# Patient Record
Sex: Male | Born: 1955 | Race: White | Hispanic: No | Marital: Married | Smoking: Former smoker
Health system: Southern US, Community
[De-identification: ages and names within clinical notes are randomized; demographics above are authoritative.]

## PROBLEM LIST (undated history)

## (undated) DIAGNOSIS — Z98811 Dental restoration status: Secondary | ICD-10-CM

## (undated) DIAGNOSIS — G4730 Sleep apnea, unspecified: Secondary | ICD-10-CM

## (undated) DIAGNOSIS — K219 Gastro-esophageal reflux disease without esophagitis: Secondary | ICD-10-CM

## (undated) DIAGNOSIS — T7840XA Allergy, unspecified, initial encounter: Secondary | ICD-10-CM

## (undated) DIAGNOSIS — F329 Major depressive disorder, single episode, unspecified: Secondary | ICD-10-CM

## (undated) DIAGNOSIS — J449 Chronic obstructive pulmonary disease, unspecified: Secondary | ICD-10-CM

## (undated) DIAGNOSIS — R918 Other nonspecific abnormal finding of lung field: Secondary | ICD-10-CM

## (undated) DIAGNOSIS — E785 Hyperlipidemia, unspecified: Secondary | ICD-10-CM

## (undated) DIAGNOSIS — C61 Malignant neoplasm of prostate: Secondary | ICD-10-CM

## (undated) DIAGNOSIS — M1990 Unspecified osteoarthritis, unspecified site: Secondary | ICD-10-CM

## (undated) DIAGNOSIS — G4733 Obstructive sleep apnea (adult) (pediatric): Secondary | ICD-10-CM

## (undated) DIAGNOSIS — C801 Malignant (primary) neoplasm, unspecified: Secondary | ICD-10-CM

## (undated) DIAGNOSIS — Z923 Personal history of irradiation: Secondary | ICD-10-CM

## (undated) DIAGNOSIS — Z9989 Dependence on other enabling machines and devices: Secondary | ICD-10-CM

## (undated) DIAGNOSIS — Z8546 Personal history of malignant neoplasm of prostate: Secondary | ICD-10-CM

## (undated) DIAGNOSIS — M6530 Trigger finger, unspecified finger: Secondary | ICD-10-CM

## (undated) DIAGNOSIS — F419 Anxiety disorder, unspecified: Secondary | ICD-10-CM

## (undated) DIAGNOSIS — F32A Depression, unspecified: Secondary | ICD-10-CM

## (undated) HISTORY — DX: Malignant neoplasm of prostate: C61

## (undated) HISTORY — DX: Hyperlipidemia, unspecified: E78.5

## (undated) HISTORY — DX: Dependence on other enabling machines and devices: Z99.89

## (undated) HISTORY — DX: Malignant (primary) neoplasm, unspecified: C80.1

## (undated) HISTORY — PX: KNEE ARTHROSCOPY: SHX127

## (undated) HISTORY — PX: INGUINAL HERNIA REPAIR: SHX194

## (undated) HISTORY — DX: Obstructive sleep apnea (adult) (pediatric): G47.33

## (undated) HISTORY — DX: Allergy, unspecified, initial encounter: T78.40XA

## (undated) HISTORY — DX: Sleep apnea, unspecified: G47.30

---

## 1998-11-10 ENCOUNTER — Ambulatory Visit (HOSPITAL_COMMUNITY): Admission: RE | Admit: 1998-11-10 | Discharge: 1998-11-10 | Payer: Self-pay | Admitting: Internal Medicine

## 1999-08-29 ENCOUNTER — Ambulatory Visit (HOSPITAL_COMMUNITY): Admission: RE | Admit: 1999-08-29 | Discharge: 1999-08-29 | Payer: Self-pay | Admitting: Internal Medicine

## 2000-04-25 ENCOUNTER — Encounter: Payer: Self-pay | Admitting: Otolaryngology

## 2000-04-25 ENCOUNTER — Encounter: Admission: RE | Admit: 2000-04-25 | Discharge: 2000-04-25 | Payer: Self-pay | Admitting: Otolaryngology

## 2000-08-01 ENCOUNTER — Encounter (INDEPENDENT_AMBULATORY_CARE_PROVIDER_SITE_OTHER): Payer: Self-pay | Admitting: Specialist

## 2000-08-01 ENCOUNTER — Ambulatory Visit (HOSPITAL_BASED_OUTPATIENT_CLINIC_OR_DEPARTMENT_OTHER): Admission: RE | Admit: 2000-08-01 | Discharge: 2000-08-01 | Payer: Self-pay | Admitting: Otolaryngology

## 2000-08-01 HISTORY — PX: NASAL CONCHA BULLOSA RESECTION: SHX2062

## 2000-08-01 HISTORY — PX: NASAL TURBINATE REDUCTION: SHX2072

## 2000-08-01 HISTORY — PX: SEPTOPLASTY: SHX2393

## 2000-08-03 ENCOUNTER — Emergency Department (HOSPITAL_COMMUNITY): Admission: EM | Admit: 2000-08-03 | Discharge: 2000-08-03 | Payer: Self-pay | Admitting: Emergency Medicine

## 2004-03-29 ENCOUNTER — Encounter: Admission: RE | Admit: 2004-03-29 | Discharge: 2004-03-29 | Payer: Self-pay | Admitting: General Surgery

## 2004-03-30 ENCOUNTER — Ambulatory Visit (HOSPITAL_COMMUNITY): Admission: RE | Admit: 2004-03-30 | Discharge: 2004-03-30 | Payer: Self-pay | Admitting: General Surgery

## 2004-03-30 ENCOUNTER — Ambulatory Visit (HOSPITAL_BASED_OUTPATIENT_CLINIC_OR_DEPARTMENT_OTHER): Admission: RE | Admit: 2004-03-30 | Discharge: 2004-03-30 | Payer: Self-pay | Admitting: General Surgery

## 2004-03-30 HISTORY — PX: HYDROCELE EXCISION: SHX482

## 2004-03-30 HISTORY — PX: INGUINAL HERNIA REPAIR: SHX194

## 2004-10-09 ENCOUNTER — Ambulatory Visit: Payer: Self-pay | Admitting: Critical Care Medicine

## 2004-10-18 ENCOUNTER — Ambulatory Visit: Payer: Self-pay | Admitting: Internal Medicine

## 2004-10-30 ENCOUNTER — Ambulatory Visit: Payer: Self-pay | Admitting: Gastroenterology

## 2004-11-02 ENCOUNTER — Ambulatory Visit: Payer: Self-pay | Admitting: Gastroenterology

## 2004-11-07 ENCOUNTER — Ambulatory Visit: Payer: Self-pay | Admitting: Internal Medicine

## 2004-11-09 ENCOUNTER — Ambulatory Visit: Payer: Self-pay | Admitting: Internal Medicine

## 2004-12-08 ENCOUNTER — Ambulatory Visit: Payer: Self-pay | Admitting: Internal Medicine

## 2005-03-28 ENCOUNTER — Ambulatory Visit: Payer: Self-pay | Admitting: Internal Medicine

## 2006-04-09 ENCOUNTER — Ambulatory Visit: Payer: Self-pay | Admitting: Internal Medicine

## 2006-04-24 ENCOUNTER — Ambulatory Visit: Payer: Self-pay | Admitting: Gastroenterology

## 2006-04-24 ENCOUNTER — Ambulatory Visit: Payer: Self-pay | Admitting: Internal Medicine

## 2006-05-08 ENCOUNTER — Ambulatory Visit: Payer: Self-pay | Admitting: Gastroenterology

## 2006-05-08 HISTORY — PX: COLONOSCOPY: SHX174

## 2006-05-08 LAB — HM COLONOSCOPY: HM COLON: NORMAL

## 2006-07-25 ENCOUNTER — Ambulatory Visit: Payer: Self-pay | Admitting: Internal Medicine

## 2006-11-13 ENCOUNTER — Ambulatory Visit: Payer: Self-pay | Admitting: Internal Medicine

## 2006-12-18 ENCOUNTER — Ambulatory Visit: Payer: Self-pay | Admitting: Internal Medicine

## 2007-10-01 ENCOUNTER — Ambulatory Visit: Payer: Self-pay | Admitting: Internal Medicine

## 2007-10-01 LAB — CONVERTED CEMR LAB
ALT: 42 units/L (ref 0–53)
AST: 33 units/L (ref 0–37)
Albumin: 4.2 g/dL (ref 3.5–5.2)
Alkaline Phosphatase: 103 units/L (ref 39–117)
BUN: 6 mg/dL (ref 6–23)
Basophils Absolute: 0 10*3/uL (ref 0.0–0.1)
Basophils Relative: 0 % (ref 0.0–1.0)
Bilirubin Urine: NEGATIVE
Bilirubin, Direct: 0.2 mg/dL (ref 0.0–0.3)
CO2: 30 meq/L (ref 19–32)
Calcium: 9.2 mg/dL (ref 8.4–10.5)
Chloride: 101 meq/L (ref 96–112)
Cholesterol: 171 mg/dL (ref 0–200)
Creatinine, Ser: 0.8 mg/dL (ref 0.4–1.5)
Direct LDL: 80.7 mg/dL
Eosinophils Absolute: 0.1 10*3/uL (ref 0.0–0.6)
Eosinophils Relative: 1.6 % (ref 0.0–5.0)
GFR calc Af Amer: 130 mL/min
GFR calc non Af Amer: 107 mL/min
Glucose, Bld: 97 mg/dL (ref 70–99)
HCT: 41.4 % (ref 39.0–52.0)
HDL: 32.2 mg/dL — ABNORMAL LOW (ref >39.0)
Hemoglobin, Urine: NEGATIVE
Hemoglobin: 14.7 g/dL (ref 13.0–17.0)
Ketones, ur: NEGATIVE mg/dL
Leukocytes, UA: NEGATIVE
Lymphocytes Relative: 16 % (ref 12.0–46.0)
MCHC: 35.5 g/dL (ref 30.0–36.0)
MCV: 87 fL (ref 78.0–100.0)
Monocytes Absolute: 0.2 10*3/uL (ref 0.2–0.7)
Monocytes Relative: 3 % (ref 3.0–11.0)
Neutro Abs: 5.2 10*3/uL (ref 1.4–7.7)
Neutrophils Relative %: 79.4 % — ABNORMAL HIGH (ref 43.0–77.0)
Nitrite: NEGATIVE
PSA: 1.43 ng/mL (ref 0.10–4.00)
Platelets: 307 10*3/uL (ref 150–400)
Potassium: 4 meq/L (ref 3.5–5.1)
RBC: 4.75 M/uL (ref 4.22–5.81)
RDW: 12.6 % (ref 11.5–14.6)
Sodium: 139 meq/L (ref 135–145)
Specific Gravity, Urine: 1.01 (ref 1.000–1.03)
TSH: 2.29 microintl units/mL (ref 0.35–5.50)
Total Bilirubin: 1.2 mg/dL (ref 0.3–1.2)
Total CHOL/HDL Ratio: 5.3
Total Protein, Urine: NEGATIVE mg/dL
Total Protein: 6.8 g/dL (ref 6.0–8.3)
Triglycerides: 320 mg/dL (ref 0–149)
Urine Glucose: NEGATIVE mg/dL
Urobilinogen, UA: 0.2 (ref 0.0–1.0)
VLDL: 64 mg/dL — ABNORMAL HIGH (ref 0–40)
WBC: 6.6 10*3/uL (ref 4.5–10.5)
pH: 7 (ref 5.0–8.0)

## 2007-10-08 DIAGNOSIS — F418 Other specified anxiety disorders: Secondary | ICD-10-CM | POA: Insufficient documentation

## 2007-10-08 DIAGNOSIS — K219 Gastro-esophageal reflux disease without esophagitis: Secondary | ICD-10-CM | POA: Insufficient documentation

## 2007-10-08 DIAGNOSIS — J45909 Unspecified asthma, uncomplicated: Secondary | ICD-10-CM | POA: Insufficient documentation

## 2007-10-09 ENCOUNTER — Ambulatory Visit: Payer: Self-pay | Admitting: Internal Medicine

## 2007-10-09 DIAGNOSIS — D2370 Other benign neoplasm of skin of unspecified lower limb, including hip: Secondary | ICD-10-CM | POA: Insufficient documentation

## 2008-04-01 ENCOUNTER — Encounter: Payer: Self-pay | Admitting: Internal Medicine

## 2008-04-07 ENCOUNTER — Encounter: Payer: Self-pay | Admitting: Internal Medicine

## 2011-04-20 NOTE — Op Note (Signed)
NAME:  Samuel Richmond, Samuel Richmond                        ACCOUNT NO.:  [id number]   MEDICAL RECORD NO.:  [id number]                   PATIENT TYPE:  AMB   LOCATION:  DSC                                  FACILITY:  [facility]   PHYSICIAN:  Ollen Gross. Vernell Morgans, M.D.              DATE OF BIRTH:  [birth year]   DATE OF PROCEDURE:  03/30/2004  DATE OF DISCHARGE:  03/30/2004                                 OPERATIVE REPORT   PREOPERATIVE DIAGNOSIS:  Right inguinal hernia.   POSTOPERATIVE DIAGNOSIS:  Right direct inguinal hernia with large hydrocele.   PROCEDURE:  Right inguinal hernia repair with mesh and hydrocelectomy.   SURGEON:  Dr. Carolynne Edouard.   ANESTHESIA:  General via LMA.   PROCEDURE:  After informed consent was obtained, the patient was brought to  the operating room and placed in the supine position on the operating room  table.  After adequate levels of general anesthesia, the patient's abdomen  and right groin was prepped with Betadine and draped in the usual sterile  manner.  An incision was made from the edge of the pubic tubercle on the  right towards the anterior superior iliac spine for a distance of about 5  cm.  This incision was carried down through the skin and subcutaneous tissue  sharply with the electrocautery.  A small bridging vein and subcutaneous  tissue was encountered and this was clamped with hemostats and divided and  ligated with 3-0 silk ties.  The rest of the dissection was carried down  sharply with the electrocautery to the fascia of the external oblique.  The  fascia over the external oblique was opened along its fibers towards the  apex of the external ring.  This was done sharply with the Metzenbaum  scissors, taking care to avoid the ileoinguinal nerve beneath.  The nerve  appeared to be wrapped up in a significant amount of scar tissue and so the  nerve was ligated both proximally and distally by clamping with a hemostat  and dividing and ligating with 3-0 silk ties.   Blunt finger dissection was  carried out of the cord structures at the edge of the pubic tubercle.  At  this point, it was noted that there was a very large fluid-filled cyst  accompanying the cord structures down to the testicle.  There was also a  noticeable weakness of the floor of the inguinal canal with a small broad-  based hernia.  The hydrocele was separated somewhat from the rest of the  cord structures by a combination of blunt dissection with a hemostat and  sharp dissection with the electrocautery.  The sac was then opened and the  majority of the sac was able to be excised sharply with the Bovie  electrocautery, separating it from the rest of the cord structures.  The  hydrocele was tethered to the testicle and a small portion of the hydrocele  cavity lining  was left intact on the testicle.  At this point, the cord  structures were significantly smaller and able to be evaluated better.  There was no apparent indirect component of hernia to this area.  The floor  of the inguinal canal, the broad-based direct hernia at the floor of the  inguinal canal was reduced below the transversalis layer.  The floor of the  canal was repaired with some interrupted 2-0 Vicryl stitches and then the  floor of the canal was reinforced with a piece of 3 x 6 Prolene mesh that  was cut to fit.  Tails were cut in the mesh laterally to wrap around the  cord structures.  The mesh was sewn inferiorly to the shelving edge of the  inguinal ligament using a running 2-0 Prolene stitch.  Superiorly the mesh  was sewn to the muscular layer of the aponeurotic transverse layer using  interrupted 2-0 Prolene vertical mattress sutures.  The tails of the mesh  were wrapped around the cord structures and the tails were anchored  laterally to the shelving edge of the inguinal ligament with an interrupted  2-0 Prolene stitch.  Once this was completed, the mesh appeared to be in  good position without any tension.   The repair was intact.  The wound was  irrigated with copious amounts of saline.  The external oblique was then  reapproximated with a running 2-0 Vicryl stitch.  The wound was infiltrated  with 0.5% Marcaine.  The subcutaneous fascia was closed with a running 3-0  Vicryl stitch and the skin was closed with a  running 4-0 Monocryl subcuticular stitch.  Benzoin and Steri-Strips and  sterile dressings were applied.  The patient tolerated the procedure well.  At the end of the case, all needle, sponge and instrument counts were  correct.  The patient was then awakened and taken to the recovery room in  stable condition.                                               Ollen Gross. Vernell Morgans, M.D.    PST/MEDQ  D:  04/03/2004  T:  04/03/2004  Job:  [id number]

## 2011-04-20 NOTE — Op Note (Signed)
[facility]  Patient:    Samuel Richmond, Samuel Richmond                     MRN: [id number] Proc. Date: 08/01/00 Adm. Date:  [id number] Attending:  Serena Colonel H CC:         Pearla Dubonnet, M.D.   Operative Report  PREOPERATIVE DIAGNOSES: 1. Septal deviation. 2. Hypertrophic changes of the inferior turbinates. 3. Right concha bullosa.  POSTOPERATIVE DIAGNOSES: 1. Septal deviation. 2. Hypertrophic changes of the inferior turbinates. 3. Right concha bullosa.  PROCEDURES: 1. Nasal septoplasty. 2. Submucous resection inferior turbinates. 3. Resection of right concha bullosa of the middle turbinate.  SURGEON:  Jefry H. Pollyann Kennedy, M.D.  ANESTHESIA:  General endotracheal anesthesia.  COMPLICATIONS:  None.  ESTIMATED BLOOD LOSS:  20 cc.  FINDINGS:  Severe deviation of the septum with bilateral spurs and deviated areas, but mainly to the left side.  Severe enlargement and thickening of the inferior turbinates.  Large concha bullosa right middle turbinate with obstruction of the nasal cavity and the osteomeatal complex on the right side.  REFERRING PHYSICIAN:  Dr. Pearla Dubonnet, M.D.  DISPOSITION:  The patient tolerated the procedure well, was awakened, extubated and transferred to recovery in stable condition.  INDICATIONS FOR PROCEDURE:  This is a 55 year old with a long history of nasal obstruction and recurring sinus infections.  Risks, benefits, alternatives and complications of the procedure were explained to the patient, who seemed to understand and agreed to surgery.  PROCEDURE:  The patient was taken to the operating room, placed on the operating table in supine position.  Following induction of general endotracheal anesthesia, the patient was prepped and draped in a standard fashion.  Afrin spray was used preoperatively in the nose.  Xylocaine 1% with 1:100,000 epinephrine was infiltrated along the septum, columella, the inferior  turbinates and the superior and posterior attachments of the right middle turbinate.  A total of about 5 cc were used.   Nasal cavities were then packed with Afrin-soaked pledgets.  1. Septoplasty.  A left hemitransfixion incision was created with a 15 scalpel    and a mucoperichondrial flap was developed posteriorly down the left side    of the nose.  Great care was taken to avoid tearing the mucosa, but several    tears did occur while trying to elevate mucosa around very sharp spurs on    the left side.  The bony cartilaginous junction was divided and a flap was    developed down the right side as well.  The right flap was kept intact.    The superior attachment of the ethmoid plate was resected with    Jansen-Middleton rongeur and the posterior and inferior attachments were    taken down with a Public house manager.  A large fragment of deviated bone was    resected.  There was a large bony spur posteriorly just anterior to the    sphenoid rostrum.  This was taken down as well.  There was also thickening    and lateral deviation to the left side of the vomerian bone, which was    resected as well.  The inferior attachment of the quadrangular cartilage    was elongated and curved over to the left nasal cavity.  A 3-4 mm strip was    resected and this allowed the cartilage to lie nicely along the maxillary    crest.  The mucosal incision was reapproximated with 4-0  chromic suture.  A    quilting suture of plain gut was used to reappose the septal flaps and to    realign the mucosal tears.  2. Submucous resection inferior turbinates.  The leading edge of the inferior    turbinates was incised with a 15 scalpel in a vertical fashion.  A Freer    elevator was used to elevate mucosa off the bony turbinates laterally,    medially and inferiorly.  The bone was crushed and fragments were removed.    The remaining turbinates were then out-fractured with a Therapist, nutritional.  3. Resection right concha  bullosa.  A sickle knife was used to create an    incision in the medial anterior edge of the concha bullosa.  The lateral    wall of the concha was then resected using turbinectomy scissor and    through-cut sinus forceps.  This opened up the nasal cavity and the middle    meatus area, including the infundibulum nicely.  There was minimal    bleeding.  There was some polypoid change of the anterior edge of the    middle turbinate.  The sinus within was clear.  The nasal cavities were suctioned of blood and secretions and packed with rolled up Telfa gauze coated with bacitracin ointment.  The pharynx was suctioned under direct visualization and the patient was awakened, extubated and transferred to recovery. DD:  08/01/00 TD:  08/01/00 Job: [id number]

## 2012-08-28 ENCOUNTER — Other Ambulatory Visit: Payer: Self-pay | Admitting: Orthopedic Surgery

## 2012-09-03 NOTE — H&P (Signed)
  Samuel Richmond presents for evaluation of several issues affecting his hand. Samuel Richmond is a 56 year old [employer] employee who has had a history of stenosing tenosynovitis of his left long finger for the past 2 years. He has seen Samuel Richmond and has had 2 injections. The first injection provided 90 days relief and the second injection perhaps 4-5 months. 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   Samuel Richmond past history is reviewed in detail. He does have a positive family history of Dupuytren's disease. His father had significant contractures. He reports rare numbness in his hands. He has no history of diabetes, thyroid disease or gout. He is 6' and appropriate weight for his height. He uses OTC Advil occasionally for pain. He has no drug allergies. He is on no prescription medications. He has had hernia surgery 2005 and knee arthroscopy. His social history reveals he is married. He is a nonsmoker. He enjoys an alcohol free lifestyle. His family history is detailed and positive for diabetes and Dupuytren's. A 14 system review of systems reveals corrective lenses, history of asthma and a history of renal disease.   Physical exam reveals a very pleasant 56 year old gentleman. Inspection of his hand reveals early Dupuytren's palmar fibromatosis in the pretendinous fingers of the ring and small fingers on the left and minimal disease on the right. There are no clinical contractures. He reports nodular fibromatosis in the arch of his feet.   He has a 4 mm diameter mass at the DIP flexion crease overlying the flexor sheath that could be a Dupuytren's nodule or could be a simple cyst. He has active triggering of his left long finger and occasional triggering of his right small finger. His neurovascular exam is intact.   X-rays of his hands demonstrate early osteoarthritis of his IP joints  with squaring of the condyles over the proximal phalangeal heads and base of the middle phalanges. Assessment: (1) Background Dupuytren's disease. 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 (3) Mass left long finger DIP flexion crease. (4) Background osteoarthritis.  Plan: We will schedule him for release of his left long finger A-1 pulley and excision of his mass under local anesthesia at the [facility]. The surgery, after care, risks and benefits were described in detail. Questions regarding the surgery were invited and answered in detail.    Samuel Richmond., MD

## 2012-09-04 ENCOUNTER — Encounter (HOSPITAL_BASED_OUTPATIENT_CLINIC_OR_DEPARTMENT_OTHER): Payer: Self-pay | Admitting: *Deleted

## 2012-09-04 ENCOUNTER — Encounter (HOSPITAL_BASED_OUTPATIENT_CLINIC_OR_DEPARTMENT_OTHER)
Admission: RE | Disposition: A | Discharge status: Home / Self Care | Payer: Self-pay | Source: Ambulatory Visit | Attending: Orthopedic Surgery

## 2012-09-04 ENCOUNTER — Ambulatory Visit (HOSPITAL_BASED_OUTPATIENT_CLINIC_OR_DEPARTMENT_OTHER)
Admission: RE | Admit: 2012-09-04 | Discharge: 2012-09-04 | Disposition: A | Discharge status: Home / Self Care | Payer: Managed Care, Other (non HMO) | Source: Ambulatory Visit | Attending: Orthopedic Surgery | Admitting: Orthopedic Surgery

## 2012-09-04 DIAGNOSIS — M65839 Other synovitis and tenosynovitis, unspecified forearm: Secondary | ICD-10-CM | POA: Insufficient documentation

## 2012-09-04 DIAGNOSIS — M19049 Primary osteoarthritis, unspecified hand: Secondary | ICD-10-CM | POA: Insufficient documentation

## 2012-09-04 DIAGNOSIS — M720 Palmar fascial fibromatosis [Dupuytren]: Secondary | ICD-10-CM | POA: Insufficient documentation

## 2012-09-04 HISTORY — PX: TRIGGER FINGER RELEASE: SHX641

## 2012-09-04 SURGERY — MINOR RELEASE TRIGGER FINGER/A-1 PULLEY
Anesthesia: LOCAL | Site: Finger | Laterality: Left | Wound class: Clean

## 2012-09-04 MED ORDER — CHLORHEXIDINE GLUCONATE 4 % EX LIQD: 60.0000 mL | Freq: Once | CUTANEOUS | Status: DC

## 2012-09-04 MED ORDER — LIDOCAINE HCL 2 % IJ SOLN
INTRAMUSCULAR | Status: DC | PRN
  Administered 2012-09-04: 3 mL
  Administered 2012-09-04: 1 mL

## 2012-09-04 MED ORDER — HYDROCODONE-ACETAMINOPHEN 5-325 MG PO TABS: ORAL_TABLET | ORAL | Status: DC

## 2012-09-04 SURGICAL SUPPLY — 39 items
BANDAGE ADHESIVE 1X3 (GAUZE/BANDAGES/DRESSINGS) IMPLANT
BLADE SURG 15 STRL LF DISP TIS (BLADE) ×1 IMPLANT
BLADE SURG 15 STRL SS (BLADE) ×2
BNDG CMPR 9X4 STRL LF SNTH (GAUZE/BANDAGES/DRESSINGS)
BNDG CMPR MD 5X2 ELC HKLP STRL (GAUZE/BANDAGES/DRESSINGS) ×1
BNDG COHESIVE 1X5 TAN STRL LF (GAUZE/BANDAGES/DRESSINGS) ×2 IMPLANT
BNDG ELASTIC 2 VLCR STRL LF (GAUZE/BANDAGES/DRESSINGS) ×2 IMPLANT
BNDG ESMARK 4X9 LF (GAUZE/BANDAGES/DRESSINGS) IMPLANT
BRUSH SCRUB EZ PLAIN DRY (MISCELLANEOUS) ×2 IMPLANT
CLOTH BEACON ORANGE TIMEOUT ST (SAFETY) ×2 IMPLANT
CORDS BIPOLAR (ELECTRODE) IMPLANT
COVER MAYO STAND STRL (DRAPES) ×2 IMPLANT
COVER TABLE BACK 60X90 (DRAPES) IMPLANT
CUFF TOURNIQUET SINGLE 18IN (TOURNIQUET CUFF) ×1 IMPLANT
DECANTER SPIKE VIAL GLASS SM (MISCELLANEOUS) IMPLANT
DRAPE SURG 17X23 STRL (DRAPES) ×2 IMPLANT
GAUZE SPONGE 4X4 12PLY STRL LF (GAUZE/BANDAGES/DRESSINGS) ×4 IMPLANT
GLOVE BIO SURGEON STRL SZ 6.5 (GLOVE) ×1 IMPLANT
GLOVE BIOGEL M STRL SZ7.5 (GLOVE) ×2 IMPLANT
GLOVE ORTHO TXT STRL SZ7.5 (GLOVE) ×2 IMPLANT
GLOVE SKINSENSE NS SZ7.0 (GLOVE) ×1
GLOVE SKINSENSE STRL SZ7.0 (GLOVE) IMPLANT
GOWN PREVENTION PLUS XLARGE (GOWN DISPOSABLE) ×2 IMPLANT
NEEDLE 27GAX1X1/2 (NEEDLE) ×3 IMPLANT
PACK BASIN DAY SURGERY FS (CUSTOM PROCEDURE TRAY) IMPLANT
PADDING CAST ABS 4INX4YD NS (CAST SUPPLIES) ×1
PADDING CAST ABS COTTON 4X4 ST (CAST SUPPLIES) ×1 IMPLANT
SPONGE GAUZE 4X4 12PLY (GAUZE/BANDAGES/DRESSINGS) ×2 IMPLANT
STOCKINETTE 4X48 STRL (DRAPES) ×2 IMPLANT
STRIP CLOSURE SKIN 1/2X4 (GAUZE/BANDAGES/DRESSINGS) ×2 IMPLANT
SUT PROLENE 3 0 PS 2 (SUTURE) ×2 IMPLANT
SUT PROLENE 4 0 P 3 18 (SUTURE) ×1 IMPLANT
SYR 3ML 23GX1 SAFETY (SYRINGE) ×1 IMPLANT
SYR CONTROL 10ML LL (SYRINGE) ×2 IMPLANT
TOWEL OR 17X24 6PK STRL BLUE (TOWEL DISPOSABLE) ×3 IMPLANT
TOWEL OR NON WOVEN STRL DISP B (DISPOSABLE) ×1 IMPLANT
TRAY DSU PREP LF (CUSTOM PROCEDURE TRAY) ×2 IMPLANT
UNDERPAD 30X30 INCONTINENT (UNDERPADS AND DIAPERS) ×2 IMPLANT
WATER STERILE IRR 1000ML POUR (IV SOLUTION) IMPLANT

## 2012-09-04 NOTE — Op Note (Signed)
[id number] 

## 2012-09-04 NOTE — Brief Op Note (Signed)
09/04/2012  9:55 AM  PATIENT:  [name]  56 y.o. 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  PROCEDURE:  Release A-1 pulley of left long finger and excision of fibroma left long dip palmar surface  SURGEON:  Surgeon(s) and Role:    * Wyn Forster., MD - Primary  PHYSICIAN ASSISTANT:   ASSISTANTS:Teka Chanda Dasnoit,P.A-C   ANESTHESIA:   local  EBL:     BLOOD ADMINISTERED:none  DRAINS: none   LOCAL MEDICATIONS USED:  LIDOCAINE   SPECIMEN:  No Specimen  DISPOSITION OF SPECIMEN:  N/A  COUNTS:  YES  TOURNIQUET:   Total Tourniquet Time Documented: Forearm (Left) - 6 minutes  DICTATION: .Other Dictation: Dictation Number [id number]  PLAN OF CARE: Discharge to home after PACU  PATIENT DISPOSITION:  PACU - hemodynamically stable.

## 2012-09-05 ENCOUNTER — Encounter (HOSPITAL_BASED_OUTPATIENT_CLINIC_OR_DEPARTMENT_OTHER): Payer: Self-pay

## 2012-09-05 ENCOUNTER — Encounter (HOSPITAL_BASED_OUTPATIENT_CLINIC_OR_DEPARTMENT_OTHER): Payer: Self-pay | Admitting: Orthopedic Surgery

## 2012-09-05 NOTE — Op Note (Signed)
NAMEMarland Kitchen  KOICHI, PLATTE NO.:  [id number]  MEDICAL RECORD NO.:  [id number]  LOCATION:                                 FACILITY:  PHYSICIAN:  Katy Fitch. Shaniece Bussa, M.D. DATE OF BIRTH:  June 16, 1956  DATE OF PROCEDURE:  09/04/2012 DATE OF DISCHARGE:                              OPERATIVE REPORT   PREOPERATIVE DIAGNOSES: 1. Chronic stenosing tenosynovitis, left long finger at A1 pulley. 2. Dupuytren's palmar fibromatosis. 3. Uncomfortable mass, left long finger distal interphalangeal flexion     crease and pulp.  POSTOPERATIVE DIAGNOSES: 1. Chronic stenosing tenosynovitis, left long finger at A1 pulley. 2. Dupuytren's palmar fibromatosis. 3. Dupuytren's nodule overlying ulnar proper digital nerve, left long     finger pulp.  OPERATION: 1. Release of left long finger A1 pulley with incidental excision of     Dupuytren's fibromatosis. 2. Excision of palmar fibromatosis nodule and decompression of ulnar     proper digital nerve, left long finger.  OPERATING SURGEON:  Katy Fitch. Navaeh Kehres, MD  ASSISTANT:  Marveen Reeks Dasnoit, PA-C  ANESTHESIA:  2% lidocaine metacarpal head level block of left long finger.  This was performed as a minor operating room procedure.  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  He presents for evaluation of chronic stenosing tenosynovitis of his left long finger, having had two prior steroid injections by an orthopedic colleague.  He also has a painful mass in his long finger overlying the DIP flexion crease ulnar aspect.  This is consistent with possible Dupuytren's fibromatosis versus a myxoid cyst.  He does have background Dupuytren's contracture and a strong family history with his father having chronic Dupuytren's palmar fibromatosis.  We had detailed informed consent with Mr. Reece pointing out that he over time would develop more palmar fibromatosis due to his genetic predilection  to do so.  He will likely develop keloid scars following surgery and may have palmar fibromatosis nodules developed in the palm after the A1 pulley was released.  Questions were invited and answered in detail in the holding area prior to surgery.  PROCEDURES:  Keziah Drotar had proper surgical site identification in the holding area with marking of his long finger and palm.  He was transferred to room #4 of the [facility] and placed in supine position on the operating table.  In room #4 after informed consent and alcohol Betadine prep, 3 mL of 2% lidocaine were infiltrated into the region of the flexor sheath of the left long finger and the common digital nerves to the index, long, and ring fingers.  After 5 minutes, complete anesthesia of the radial aspect of the long finger was achieved, but inadequate anesthesia on the ulnar aspect. Therefore, we added an additional 1 mL of 2% lidocaine.  The left hand and arm were then prepped with Betadine soap and solution, sterilely draped.  A pneumatic tourniquet was applied to the proximal forearm.  Sterile stockinette and towels were applied followed by exsanguination of the left hand and forearm with direct compression and inflation of arterial tourniquet to 220 mmHg.  After checking for sensibility and a routine surgical time-out, procedure  commenced with a short oblique incision in the distal palmar crease directly over the thickened A1 pulley.  Subcutaneous tissues were carefully divided revealing palmar fibromatosis.  This was resected and released with scissors.  The A1 pulley was isolated with dissection with scissors and a Freer followed by placement of Ragnell retractors.  The A1 pulley was released and the small A0 pulley was released.  The flexor tendons were delivered and found to be invested with some fibrotic tenosynovium, which was gently removed with a rongeur and scissors.  The lead hand was used to  immobilize the fingers was removed and Mr. Eiche demonstrated full active flexion of his long finger.  We then repaired the wound with intradermal 4-0 Prolene.  A Steri-Strip was applied and the tourniquet released.  We then exsanguinated the left long finger and applied a 0.5-inch Penrose drain over the proximal phalangeal segment as a digital tourniquet.  Excellent anesthesia of the long finger was confirmed followed by a Brunner zigzag incision exposing the DIP flexion crease and ulnar proper digital nerve region.  We had encountered dense palmar fibromatosis blending into the skin with involvement of the Grayson's ligaments.  We identified the ulnar neurovascular structures proximally with neurolysis of the nerve and the artery and carefully removed the nodule.  I showed the nodule to Mr. Arntzen.  This was classic Dupuytren's palmar fibromatosis.  He was noted to have significant fibromatosis in the pretendinous fibers to the ring finger.  We discussed the implications of Dupuytren's disease at length.  His wound was repaired with interrupted sutures of 4-0 Prolene.  The finger was dressed with Steri-Strips, sterile gauze, and a Coban wrist base dressing.  There were no apparent complications.  For aftercare, Mr. Raza will remove his dressing and apply Band-Aids after 4 days.  He is provided a prescription for Vicodin 5 mg 1 p.o. q.4 hours p.r.n. pain, 20 tablets without refill.     Katy Fitch Myrka Sylva, M.D.     RVS/MEDQ  D:  09/04/2012  T:  09/04/2012  Job:  [id number]

## 2013-02-11 ENCOUNTER — Encounter (HOSPITAL_BASED_OUTPATIENT_CLINIC_OR_DEPARTMENT_OTHER): Payer: Self-pay | Admitting: *Deleted

## 2013-02-11 ENCOUNTER — Emergency Department (HOSPITAL_BASED_OUTPATIENT_CLINIC_OR_DEPARTMENT_OTHER)
Admission: EM | Admit: 2013-02-11 | Discharge: 2013-02-12 | Disposition: A | Discharge status: Home / Self Care | Payer: Managed Care, Other (non HMO) | Attending: Emergency Medicine | Admitting: Emergency Medicine

## 2013-02-11 DIAGNOSIS — T368X5A Adverse effect of other systemic antibiotics, initial encounter: Secondary | ICD-10-CM | POA: Insufficient documentation

## 2013-02-11 DIAGNOSIS — M79609 Pain in unspecified limb: Secondary | ICD-10-CM | POA: Insufficient documentation

## 2013-02-11 DIAGNOSIS — T50905A Adverse effect of unspecified drugs, medicaments and biological substances, initial encounter: Secondary | ICD-10-CM

## 2013-02-11 DIAGNOSIS — Z79899 Other long term (current) drug therapy: Secondary | ICD-10-CM | POA: Insufficient documentation

## 2013-02-11 LAB — URINALYSIS, ROUTINE W REFLEX MICROSCOPIC
Bilirubin Urine: NEGATIVE
Protein, ur: NEGATIVE mg/dL
Urobilinogen, UA: 0.2 mg/dL (ref 0.0–1.0)

## 2013-02-11 LAB — URINE MICROSCOPIC-ADD ON

## 2013-02-11 NOTE — ED Notes (Signed)
Patient had a prostate biopsy yesterday and today began having pains in both legs and hips. Also states that he has been having chills. Spoke with on call dr who advised patient to come to the ER. Taking levaquin since yesterday.

## 2013-02-11 NOTE — ED Provider Notes (Signed)
History     CSN: [id number]  Arrival date & time 02/11/13  2047   First MD Initiated Contact with Patient 02/11/13 2312      Chief Complaint  Patient presents with  . Leg Pain    (Consider location/radiation/quality/duration/timing/severity/associated sxs/prior treatment) HPI This is a 57 year old male who underwent a transrectal prostate biopsy yesterday. The procedure went well and there were no immediate complications. He had been taking Levaquin. This evening about 6 PM he took his last Levaquin tablet. Within 30 minutes he experienced severe muscle cramping primarily in his lateral thighs. This was accompanied by chills but no fever. It was somewhat improved by pacing around. He has some rectal bleeding yesterday but this is resolved. He has had occasional spots of blood in his urine but his urine has otherwise been clear. His symptoms of nearly completely resolved on her own and he has only some mild soreness in his muscles at this time.  History reviewed. No pertinent past medical history.  Past Surgical History  Procedure Laterality Date  . Trigger finger release  09/04/2012    Procedure: MINOR RELEASE TRIGGER FINGER/A-1 PULLEY;  Surgeon: Wyn Forster., MD;  Location: [facility];  Service: Orthopedics;  Laterality: Left;  left long and cyst excision left long    No family history on file.  History  Substance Use Topics  . Smoking status: Never Smoker   . Smokeless tobacco: Not on file  . Alcohol Use: No      Review of Systems  All other systems reviewed and are negative.    Allergies  Review of patient's allergies indicates no known allergies.  Home Medications   Current Outpatient Rx  Name  Route  Sig  Dispense  Refill  . doxazosin (CARDURA) 4 MG tablet   Oral   Take 4 mg by mouth at bedtime.         [name] escitalopram (LEXAPRO) 10 MG tablet   Oral   Take 10 mg by mouth daily.         . fish oil-omega-3 fatty acids 1000 MG  capsule   Oral   Take 2 g by mouth daily.         [name] HYDROcodone-acetaminophen (NORCO/VICODIN) 5-325 MG per tablet      1 or 2 tabs every 4 hours as needed for pain   20 tablet   0     BP 110/64  Pulse 80  Temp(Src) 98.2 F (36.8 C) (Oral)  SpO2 100%  Physical Exam General: Well-developed, well-nourished male in no acute distress; appearance consistent with age of record HENT: normocephalic, atraumatic Eyes: pupils equal round and reactive to light; extraocular muscles intact Neck: supple Heart: regular rate and rhythm Lungs: clear to auscultation bilaterally Abdomen: soft; nondistended; nontender; bowel sounds present Extremities: No deformity; full range of motion; pulses normal; muscles soft, nontender Neurologic: Awake, alert and oriented; motor function intact in all extremities and symmetric; no facial droop Skin: Warm and dry Psychiatric: Normal mood and affect    ED Course  Procedures (including critical care time)    MDM   Nursing notes and vitals signs, including pulse oximetry, reviewed.  Summary of this visit's results, reviewed by myself:  Labs:  Results for orders placed during the hospital encounter of 02/11/13 (from the past 24 hour(s))  URINALYSIS, ROUTINE W REFLEX MICROSCOPIC     Status: Abnormal   Collection Time    02/11/13 11:29 PM      Result Value  Range   Color, Urine YELLOW  YELLOW   APPearance CLEAR  CLEAR   Specific Gravity, Urine 1.028  1.005 - 1.030   pH 5.0  5.0 - 8.0   Glucose, UA NEGATIVE  NEGATIVE mg/dL   Hgb urine dipstick LARGE (*) NEGATIVE   Bilirubin Urine NEGATIVE  NEGATIVE   Ketones, ur NEGATIVE  NEGATIVE mg/dL   Protein, ur NEGATIVE  NEGATIVE mg/dL   Urobilinogen, UA 0.2  0.0 - 1.0 mg/dL   Nitrite NEGATIVE  NEGATIVE   Leukocytes, UA NEGATIVE  NEGATIVE  URINE MICROSCOPIC-ADD ON     Status: Abnormal   Collection Time    02/11/13 11:29 PM      Result Value Range   Squamous Epithelial / LPF RARE  RARE   WBC, UA 0-2   <3 WBC/hpf   RBC / HPF 21-50  <3 RBC/hpf   Bacteria, UA RARE  RARE   Crystals CA OXALATE CRYSTALS (*) NEGATIVE   Urine-Other MUCOUS PRESENT     11:53 PM Symptoms have resolved. Suspect this was an adverse reaction to levofloxacin as it can cause myalgias        Hanley Seamen, MD 02/11/13 2354

## 2013-02-11 NOTE — ED Notes (Signed)
MD at bedside. 

## 2013-02-24 ENCOUNTER — Encounter: Payer: Self-pay | Admitting: Oncology

## 2013-02-24 ENCOUNTER — Other Ambulatory Visit (HOSPITAL_COMMUNITY): Payer: Self-pay | Admitting: Urology

## 2013-02-24 DIAGNOSIS — C61 Malignant neoplasm of prostate: Secondary | ICD-10-CM

## 2013-02-25 ENCOUNTER — Other Ambulatory Visit: Payer: Self-pay | Admitting: Urology

## 2013-02-25 ENCOUNTER — Encounter (HOSPITAL_COMMUNITY)
Admission: RE | Admit: 2013-02-25 | Discharge: 2013-02-25 | Disposition: A | Discharge status: Home / Self Care | Payer: Managed Care, Other (non HMO) | Source: Ambulatory Visit | Attending: Urology | Admitting: Urology

## 2013-02-25 DIAGNOSIS — M545 Low back pain, unspecified: Secondary | ICD-10-CM | POA: Insufficient documentation

## 2013-02-25 DIAGNOSIS — C61 Malignant neoplasm of prostate: Secondary | ICD-10-CM | POA: Insufficient documentation

## 2013-02-25 DIAGNOSIS — J45909 Unspecified asthma, uncomplicated: Secondary | ICD-10-CM | POA: Insufficient documentation

## 2013-02-25 MED ORDER — TECHNETIUM TC 99M MEDRONATE IV KIT
25.0000 | PACK | Freq: Once | INTRAVENOUS | Status: AC | PRN
  Administered 2013-02-25: 25 via INTRAVENOUS

## 2013-02-26 ENCOUNTER — Encounter: Payer: Self-pay | Admitting: Radiation Oncology

## 2013-02-26 ENCOUNTER — Ambulatory Visit
Admission: RE | Admit: 2013-02-26 | Discharge: 2013-02-26 | Disposition: A | Discharge status: Home / Self Care | Payer: Managed Care, Other (non HMO) | Source: Ambulatory Visit | Attending: Radiation Oncology | Admitting: Radiation Oncology

## 2013-02-26 VITALS — BP 100/68 | HR 66 | Temp 98.2°F | Resp 20 | Ht 72.0 in | Wt 191.6 lb

## 2013-02-26 DIAGNOSIS — C61 Malignant neoplasm of prostate: Secondary | ICD-10-CM

## 2013-02-26 NOTE — Progress Notes (Signed)
Please see the Nurse Progress Note in the MD Initial Consult Encounter for this patient. 

## 2013-02-26 NOTE — Progress Notes (Signed)
[facility] Radiation Oncology NEW PATIENT EVALUATION  Name: Samuel Richmond MRN: [id number]  Date:   02/26/2013           DOB: [birth year]  Status: outpatient   CC: Thomos Lemons, DO   Dr. Oda Kilts, Dr. Heloise Purpura   REFERRING PHYSICIAN: Jethro Bolus I, *   DIAGNOSIS: Stage TI C. intermediate risk adenocarcinoma prostate   HISTORY OF PRESENT ILLNESS:  Samuel Richmond is a 57 y.o. male who is seen today for the courtesy of Dr. Patsi Sears for evaluation of his stage TI C. intermediate risk adenocarcinoma prostate the patient states that he may have a slightly elevated PSA back in 2011. He has a history of prostatitis, having been treated with antibiotics on numerous occasions. During his last visit he requested a PSA which was found to be 5.33 on 11/04/2012. He was seen by Dr. Patsi Sears who performed ultrasound-guided biopsies on 02/10/2013. His then have Gleason 7 (4+3) involving 20% of one core from the right mid gland and Gleason 6 (3+3) involving less than 5% of one core from right lateral apex and 5% of one core from the right apex. His gland volume was approximately 29 cc. He does have moderate obstructive symptomatology with an I PSS score of 15 while on Cardura. No GI difficulties. He does have a history of chronic low back pain, and he had a bone scan yesterday which was without evidence for metastatic disease. A CT scan of the abdomen/pelvis was without evidence for metastatic disease. He is potent. He has seen Dr. Heloise Purpura for discussion of robotic prostatectomy.  PREVIOUS RADIATION THERAPY: No   PAST MEDICAL HISTORY:  has a past medical history of Asthma; Attention deficit hyperactivity disorder (ADHD); Dyslipidemia; Esophageal reflux; Elevated PSA (11/04/2012); and Prostate cancer (02/10/13).     PAST SURGICAL HISTORY:  Past Surgical History  Procedure Laterality Date  . Trigger finger release  09/04/2012    Procedure: MINOR RELEASE TRIGGER  FINGER/A-1 PULLEY;  Surgeon: Wyn Forster., MD;  Location: [facility];  Service: Orthopedics;  Laterality: Left;  left long and cyst excision left long  . Hernia repair    . Knee surgery      x 5  . Nasal sinus surgery       FAMILY HISTORY: family history includes Prostate cancer in his father. As follows diagnosed with prostate cancer a 60. He was treated with surgery and apparently salvage radiation therapy 10 years later. He died from myelodysplastic syndrome. His mother is alive and well at 45.   SOCIAL HISTORY:  reports that he quit smoking about 14 years ago. He does not have any smokeless tobacco history on file. He reports that he does not drink alcohol. married, 2 daughters ages 84 and 80. He works as a Production designer, theatre/television/film at [employer].  ALLERGIES: Review of patient's allergies indicates no known allergies.   MEDICATIONS:  Current Outpatient Prescriptions  Medication Sig Dispense Refill  . doxazosin (CARDURA) 4 MG tablet Take 4 mg by mouth at bedtime.      [name] escitalopram (LEXAPRO) 10 MG tablet Take 10 mg by mouth daily.      . fish oil-omega-3 fatty acids 1000 MG capsule Take 2 g by mouth daily.      . fluticasone-salmeterol (ADVAIR HFA) 115-21 MCG/ACT inhaler Inhale 2 puffs into the lungs 2 (two) times daily.       No current facility-administered medications for this encounter.     REVIEW OF SYSTEMS:  Pertinent items are noted in HPI.    PHYSICAL EXAM:  height is 6' (1.829 m) and weight is 191 lb 9.6 oz (86.909 kg). His oral temperature is 98.2 F (36.8 C). His blood pressure is 100/68 and his pulse is 66. His respiration is 20.   Head and neck examination: Grossly unremarkable. Nodes: Without palpable cervical or supraclavicular lymphadenopathy. Chest: Lungs clear. Heart: Regular rate rhythm. Back: Without spinal or CVA tenderness. Abdomen: Without masses organomegaly. Genitalia: Normal to inspection. Rectal: The prostate gland is normal in size and is without focal  induration or nodularity. Extremities: Without edema. Neurologic examination: Grossly nonfocal.   LABORATORY DATA:  Lab Results  Component Value Date   WBC 6.6 10/01/2007   HGB 14.7 10/01/2007   HCT 41.4 10/01/2007   MCV 87.0 10/01/2007   PLT 307 10/01/2007   Lab Results  Component Value Date   NA 139 10/01/2007   K 4.0 10/01/2007   CL 101 10/01/2007   CO2 30 10/01/2007   Lab Results  Component Value Date   ALT 42 10/01/2007   AST 33 10/01/2007   ALKPHOS 103 10/01/2007   BILITOT 1.2 10/01/2007   PSA 5.33 from 11/04/2012.   IMPRESSION: Stage TI C. intermediate risk adenocarcinoma prostate. I explained to the patient and his wife that his prognosis is related to his stage, Gleason score, and PSA level. His stage and PSA level are favorable while his Gleason score of 7 is of intermediate favorability. Other prognostic factors include PSA doubling time and disease volume. He appears to have low disease volume. We discussed options including surgery, surveillance, and radiation therapy. Radiation therapy options include seed implantation with or without 5 weeks of external beam/3-D conformal treatment versus 8 weeks of external beam/IMRT. Considering his degree of obstructive symptomatology with an I PSS score of 15 while on Cardura, I do not feel that he would be a good candidate for seed implantation. His post radiation therapy options would be 8 weeks of external beam/IMRT. We discussed the potential acute and late toxicities of radiation therapy. Considering his relatively young age and good performance status, I would favor surgery even though both radiation therapy and surgery would be expected to have similar survival  outcomes. His obstructive urinary symptoms would probably improve with surgery. Lastly, he understands that there is a small chance that he may require postoperative radiation therapy should he have pathologic stage T3 disease.   PLAN: As discussed above. He will keep  his scheduled appointment for a robotic prostatectomy with Dr. Laverle Patter.   I spent 60 minutes minutes face to face with the patient and more than 50% of that time was spent in counseling and/or coordination of care.

## 2013-02-26 NOTE — Progress Notes (Signed)
Married, wife w/pt today. IPSS score 15. Pt currently interested in surgery.

## 2013-03-10 ENCOUNTER — Encounter (HOSPITAL_COMMUNITY): Payer: Self-pay | Admitting: Pharmacy Technician

## 2013-03-17 ENCOUNTER — Inpatient Hospital Stay (HOSPITAL_COMMUNITY): Admission: RE | Admit: 2013-03-17 | Payer: Managed Care, Other (non HMO) | Source: Ambulatory Visit

## 2013-03-18 ENCOUNTER — Other Ambulatory Visit (HOSPITAL_COMMUNITY): Payer: Self-pay | Admitting: *Deleted

## 2013-03-19 ENCOUNTER — Encounter (HOSPITAL_COMMUNITY): Payer: Self-pay

## 2013-03-19 ENCOUNTER — Encounter (HOSPITAL_COMMUNITY)
Admission: RE | Admit: 2013-03-19 | Discharge: 2013-03-19 | Disposition: A | Discharge status: Home / Self Care | Payer: Managed Care, Other (non HMO) | Source: Ambulatory Visit | Attending: Urology | Admitting: Urology

## 2013-03-19 ENCOUNTER — Ambulatory Visit (HOSPITAL_COMMUNITY)
Admission: RE | Admit: 2013-03-19 | Discharge: 2013-03-19 | Disposition: A | Discharge status: Home / Self Care | Payer: Managed Care, Other (non HMO) | Source: Ambulatory Visit | Attending: Urology | Admitting: Urology

## 2013-03-19 DIAGNOSIS — C61 Malignant neoplasm of prostate: Secondary | ICD-10-CM | POA: Insufficient documentation

## 2013-03-19 DIAGNOSIS — Z01818 Encounter for other preprocedural examination: Secondary | ICD-10-CM | POA: Insufficient documentation

## 2013-03-19 DIAGNOSIS — Z01810 Encounter for preprocedural cardiovascular examination: Secondary | ICD-10-CM | POA: Insufficient documentation

## 2013-03-19 HISTORY — DX: Chronic obstructive pulmonary disease, unspecified: J44.9

## 2013-03-19 HISTORY — DX: Unspecified osteoarthritis, unspecified site: M19.90

## 2013-03-19 LAB — CBC
HCT: 42.7 % (ref 39.0–52.0)
Hemoglobin: 15.2 g/dL (ref 13.0–17.0)
RDW: 13 % (ref 11.5–15.5)
WBC: 6.1 10*3/uL (ref 4.0–10.5)

## 2013-03-19 LAB — SURGICAL PCR SCREEN: Staphylococcus aureus: NEGATIVE

## 2013-03-19 LAB — BASIC METABOLIC PANEL
Chloride: 104 mEq/L (ref 96–112)
Creatinine, Ser: 0.65 mg/dL (ref 0.50–1.35)
GFR calc Af Amer: >90 mL/min (ref >90)
GFR calc non Af Amer: >90 mL/min (ref >90)
Potassium: 3.8 mEq/L (ref 3.5–5.1)

## 2013-03-19 LAB — ABO/RH: ABO/RH(D): B POS

## 2013-03-19 NOTE — Patient Instructions (Addendum)
[address] [facility]  03/19/2013   Your procedure is scheduled on:  03/26/13  THURSDAY  Report to [facility] at  0830     AM.  Call this number if you have problems the morning of surgery: [phone]       Remember:   Do not eat food after midnight Tuesday NIGHT,  BEGIN CLEAR LIQUIDS Wednesday MORNING AS DIRECTED BY OFFICE FOR BOWEL PREP. DRINK UNTIL MIDNIGHT OR BEDTIME, THEN NONE AFTER MIDNIGHT   Take these medicines the morning of surgery with A SIP OF WATER:  LEXAPRO    USE ADVAIR   .  Contacts, dentures or partial plates can not be worn to surgery  Leave suitcase in the car. After surgery it may be brought to your room.  For patients admitted to the hospital, checkout time is 11:00 AM day of  discharge.             SPECIAL INSTRUCTIONS- SEE [facility] PREPARING FOR SURGERY INSTRUCTION SHEET-     DO NOT WEAR JEWELRY, LOTIONS, POWDERS, OR PERFUMES.  WOMEN-- DO NOT SHAVE LEGS OR UNDERARMS FOR 12 HOURS BEFORE SHOWERS. MEN MAY SHAVE FACE.  Patients discharged the day of surgery will not be allowed to drive home. IF going home the day of surgery, you must have a driver and someone to stay with you for the first 24 hours  Name and phone number of your driver:Cathryn  wife                                                                        Please read over the following fact sheets that you were given: MRSA Information, Incentive Spirometry Sheet, Blood Transfusion Sheet  Information                                                                                   Dorraine Ellender  PST [phone]  [phone]                 FAILURE TO FOLLOW THESE INSTRUCTIONS MAY RESULT IN  CANCELLATION   OF YOUR SURGERY                                                  Patient Signature _____________________________

## 2013-03-25 NOTE — H&P (Signed)
Chief Complaint  Prostate Cancer    History of Present Illness        Samuel Richmond is a 57 year old who was found to have an elevated PSA which remained elevated at 5.33 after he had been treated with antibiotics for prostatitis. He was noted to have bilateral prostate nodules by Dr. Patsi Sears who proceeded with a prostate biopsy on 02/10/13 which demonstrated Gleason 4+3=7 adenocarcinoma with 3 out of 12 biopsy cores positive for malignancy. He did develop some unusual symptoms after his biopsy which included myalgias and chills. He never became febrile but did present to the emergency department. Once he stopped his Levaquin, his symptoms completely resolved without further treatment and it was felt that he did have a reaction to Levaquin.  He has a family history of prostate cancer with his father having been diagnosed at age 35. He was treated with a radical prostatectomy and did develop biochemical recurrence before dying of unrelated causes. He underwent a CT scan of the pelvis on 02/18/13 which was negative for pelvic lymphadenopathy. His bone scan was negative for obvious metastatic disease.  He was placed on alpha-blocker therapy for his voiding symptoms and has seen improvement. In addition, he does describe lower back pain which began approximately 2 years and has been relatively severe at times.  TNM stage: cT1c Nx Mx PSA: 5.33 Gleason score: 4+3=7 Biopsy (02/10/13): 3/12 cores positive -- R apex (5%, 3+3=6), R lateral apex (< 5%, 3+3=6), R mid (20%, 4+3=7) Prostate volume: 28.9 cc  Nomogram OC disease: 42% EPE: 43% SVI: 10% LNI: 3.8% PFS (surgery): 89% at 3 yrs, 84% at 8 yrs  Urinary function: His baseline IPSS was 25 prior to beginning alpha blocker therapy which has significantly improved his symptoms. Is IPSS today now on therapy is 13. Erectile function: He denies erectile dysfunction. SHIM score is 25.   Past Medical History Problems  1. History of  Asthma 493.90 2.  History of  Attention-deficit Hyperactivity Disorder 314.01 3. History of  Dyslipidemia 272.4 4. History of  Esophageal Reflux 530.81 5. Former Smoker V15.82  Surgical History Problems  1. History of  Hand Surgery Left 2. History of  Hernia Repair 3. History of  Knee Surgery 4. History of  Sinus Surgery   He underwent a right inguinal hernia repair. He has undergone arthroscopic knee surgery.   Current Meds 1. Advair Diskus MISC; Therapy: (Recorded:22Jan2014) to 2. Doxazosin Mesylate 4 MG Oral Tablet; TAKE 1 TABLET Bedtime; Therapy: 24Jan2014 to  (Evaluate:24May2014); Last Rx:24Jan2014 3. Lexapro 10 MG Oral Tablet; Therapy: 25Mar2014 to  Allergies Medication  1. Levaquin TABS (levofloxacin)  Family History Problems  1. Family history of  Father Deceased At Age 40 2. Paternal history of  Prostate Cancer V16.42  Social History Problems    Marital History - Currently Married Denied    History of  Alcohol Use  Review of Systems Constitutional, skin, eye, otolaryngeal, hematologic/lymphatic, cardiovascular, pulmonary, endocrine, musculoskeletal, gastrointestinal, neurological and psychiatric system(s) were reviewed and pertinent findings if present are noted.  Musculoskeletal: back pain.    Vitals  BMI Calculated: 25.06 BSA Calculated: 2.06 Height: 6 ft  Weight: 185 lb   Physical Exam Constitutional: Well nourished and well developed . No acute distress.  ENT:. The ears and nose are normal in appearance.  Neck: The appearance of the neck is normal and no neck mass is present.  Pulmonary: No respiratory distress, normal respiratory rhythm and effort and clear bilateral breath sounds.  Cardiovascular: Heart  rate and rhythm are normal . No peripheral edema.  Abdomen: right inguinal incision site(s) well healed. The abdomen is soft and nontender. No masses are palpated. No CVA tenderness. No hernias are palpable. No hepatosplenomegaly noted.      Assessment Assessed   1. Prostate Cancer 185   Discussion/Summary  1. Prostate cancer: He will undergo a robotic assisted laparoscopic radical prostatectomy and BPLND.

## 2013-03-26 ENCOUNTER — Encounter (HOSPITAL_COMMUNITY): Payer: Self-pay

## 2013-03-26 ENCOUNTER — Encounter (HOSPITAL_COMMUNITY): Payer: Self-pay | Admitting: Anesthesiology

## 2013-03-26 ENCOUNTER — Inpatient Hospital Stay (HOSPITAL_COMMUNITY): Payer: Managed Care, Other (non HMO) | Admitting: Anesthesiology

## 2013-03-26 ENCOUNTER — Inpatient Hospital Stay (HOSPITAL_COMMUNITY)
Admission: RE | Admit: 2013-03-26 | Discharge: 2013-03-27 | DRG: 708 | Disposition: A | Discharge status: Home / Self Care | Payer: Managed Care, Other (non HMO) | Source: Ambulatory Visit | Attending: Urology | Admitting: Urology

## 2013-03-26 ENCOUNTER — Encounter (HOSPITAL_COMMUNITY)
Admission: RE | Disposition: A | Discharge status: Home / Self Care | Payer: Self-pay | Source: Ambulatory Visit | Attending: Urology

## 2013-03-26 DIAGNOSIS — J449 Chronic obstructive pulmonary disease, unspecified: Secondary | ICD-10-CM | POA: Diagnosis present

## 2013-03-26 DIAGNOSIS — C61 Malignant neoplasm of prostate: Principal | ICD-10-CM | POA: Diagnosis present

## 2013-03-26 DIAGNOSIS — J4489 Other specified chronic obstructive pulmonary disease: Secondary | ICD-10-CM | POA: Diagnosis present

## 2013-03-26 DIAGNOSIS — F411 Generalized anxiety disorder: Secondary | ICD-10-CM | POA: Diagnosis present

## 2013-03-26 DIAGNOSIS — Z87891 Personal history of nicotine dependence: Secondary | ICD-10-CM

## 2013-03-26 HISTORY — PX: ROBOT ASSISTED LAPAROSCOPIC RADICAL PROSTATECTOMY: SHX5141

## 2013-03-26 HISTORY — PX: LYMPHADENECTOMY: SHX5960

## 2013-03-26 LAB — HEMOGLOBIN AND HEMATOCRIT, BLOOD
HCT: 37.9 % — ABNORMAL LOW (ref 39.0–52.0)
Hemoglobin: 13.7 g/dL (ref 13.0–17.0)

## 2013-03-26 SURGERY — ROBOTIC ASSISTED LAPAROSCOPIC RADICAL PROSTATECTOMY LEVEL 2
Anesthesia: General | Site: Pelvis | Laterality: Bilateral | Wound class: Clean Contaminated

## 2013-03-26 MED ORDER — GLYCOPYRROLATE 0.2 MG/ML IJ SOLN
INTRAMUSCULAR | Status: DC | PRN
  Administered 2013-03-26: 0.2 mg via INTRAVENOUS
  Administered 2013-03-26: .8 mg via INTRAVENOUS

## 2013-03-26 MED ORDER — MORPHINE SULFATE 10 MG/ML IJ SOLN
2.0000 mg | INTRAMUSCULAR | Status: DC | PRN
  Administered 2013-03-26: 2 mg via INTRAVENOUS
  Filled 2013-03-26: qty 1

## 2013-03-26 MED ORDER — SUFENTANIL CITRATE 50 MCG/ML IV SOLN
INTRAVENOUS | Status: DC | PRN
  Administered 2013-03-26: 10 ug via INTRAVENOUS
  Administered 2013-03-26 (×4): 5 ug via INTRAVENOUS

## 2013-03-26 MED ORDER — KETOROLAC TROMETHAMINE 15 MG/ML IJ SOLN
15.0000 mg | Freq: Four times a day (QID) | INTRAMUSCULAR | Status: DC
  Administered 2013-03-26 – 2013-03-27 (×3): 15 mg via INTRAVENOUS
  Filled 2013-03-26 (×5): qty 1

## 2013-03-26 MED ORDER — EPHEDRINE SULFATE 50 MG/ML IJ SOLN
INTRAMUSCULAR | Status: DC | PRN
  Administered 2013-03-26: 10 mg via INTRAVENOUS

## 2013-03-26 MED ORDER — CEFAZOLIN SODIUM 1-5 GM-% IV SOLN
1.0000 g | Freq: Three times a day (TID) | INTRAVENOUS | Status: AC
  Administered 2013-03-26 – 2013-03-27 (×2): 1 g via INTRAVENOUS
  Filled 2013-03-26 (×2): qty 50

## 2013-03-26 MED ORDER — MORPHINE SULFATE 2 MG/ML IJ SOLN
2.0000 mg | INTRAMUSCULAR | Status: DC | PRN
  Administered 2013-03-26: 2 mg via INTRAVENOUS
  Administered 2013-03-27: 4 mg via INTRAVENOUS
  Filled 2013-03-26: qty 1
  Filled 2013-03-26: qty 2

## 2013-03-26 MED ORDER — LACTATED RINGERS IV SOLN
INTRAVENOUS | Status: DC | PRN
  Administered 2013-03-26 (×2): via INTRAVENOUS

## 2013-03-26 MED ORDER — HYDROMORPHONE HCL PF 1 MG/ML IJ SOLN: 0.2500 mg | INTRAMUSCULAR | Status: DC | PRN

## 2013-03-26 MED ORDER — HYDROMORPHONE HCL PF 1 MG/ML IJ SOLN
INTRAMUSCULAR | Status: DC | PRN
  Administered 2013-03-26 (×3): 0.5 mg via INTRAVENOUS

## 2013-03-26 MED ORDER — PROPOFOL 10 MG/ML IV BOLUS
INTRAVENOUS | Status: DC | PRN
  Administered 2013-03-26: 200 mg via INTRAVENOUS

## 2013-03-26 MED ORDER — KETOROLAC TROMETHAMINE 15 MG/ML IJ SOLN
15.0000 mg | Freq: Four times a day (QID) | INTRAMUSCULAR | Status: DC
  Administered 2013-03-26: 15 mg via INTRAVENOUS

## 2013-03-26 MED ORDER — DIPHENHYDRAMINE HCL 12.5 MG/5ML PO ELIX
12.5000 mg | ORAL_SOLUTION | Freq: Four times a day (QID) | ORAL | Status: DC | PRN
  Filled 2013-03-26: qty 10

## 2013-03-26 MED ORDER — LACTATED RINGERS IV SOLN
INTRAVENOUS | Status: DC | PRN
  Administered 2013-03-26: 12:00:00

## 2013-03-26 MED ORDER — ESCITALOPRAM OXALATE 10 MG PO TABS
10.0000 mg | ORAL_TABLET | Freq: Every morning | ORAL | Status: DC
  Administered 2013-03-27: 10 mg via ORAL
  Filled 2013-03-26 (×2): qty 1

## 2013-03-26 MED ORDER — DOCUSATE SODIUM 100 MG PO CAPS
100.0000 mg | ORAL_CAPSULE | Freq: Two times a day (BID) | ORAL | Status: DC
  Administered 2013-03-26 – 2013-03-27 (×2): 100 mg via ORAL
  Filled 2013-03-26 (×4): qty 1

## 2013-03-26 MED ORDER — BUPIVACAINE-EPINEPHRINE 0.25% -1:200000 IJ SOLN
INTRAMUSCULAR | Status: DC | PRN
  Administered 2013-03-26: 20 mL

## 2013-03-26 MED ORDER — STERILE WATER FOR IRRIGATION IR SOLN
Status: DC | PRN
  Administered 2013-03-26: 1500 mL

## 2013-03-26 MED ORDER — CEFAZOLIN SODIUM-DEXTROSE 2-3 GM-% IV SOLR
2.0000 g | INTRAVENOUS | Status: AC
  Administered 2013-03-26: 2 g via INTRAVENOUS

## 2013-03-26 MED ORDER — CISATRACURIUM BESYLATE (PF) 10 MG/5ML IV SOLN
INTRAVENOUS | Status: DC | PRN
  Administered 2013-03-26: 3 mg via INTRAVENOUS

## 2013-03-26 MED ORDER — LACTATED RINGERS IV SOLN: INTRAVENOUS | Status: DC

## 2013-03-26 MED ORDER — INDIGOTINDISULFONATE SODIUM 8 MG/ML IJ SOLN
INTRAMUSCULAR | Status: DC | PRN
  Administered 2013-03-26 (×2): 5 mL via INTRAVENOUS

## 2013-03-26 MED ORDER — MIDAZOLAM HCL 5 MG/5ML IJ SOLN
INTRAMUSCULAR | Status: DC | PRN
  Administered 2013-03-26: 2 mg via INTRAVENOUS

## 2013-03-26 MED ORDER — DIPHENHYDRAMINE HCL 50 MG/ML IJ SOLN: 12.5000 mg | Freq: Four times a day (QID) | INTRAMUSCULAR | Status: DC | PRN

## 2013-03-26 MED ORDER — ACETAMINOPHEN 10 MG/ML IV SOLN
INTRAVENOUS | Status: DC | PRN
  Administered 2013-03-26: 1000 mg via INTRAVENOUS

## 2013-03-26 MED ORDER — ACETAMINOPHEN 325 MG PO TABS: 650.0000 mg | ORAL_TABLET | ORAL | Status: DC | PRN

## 2013-03-26 MED ORDER — ROCURONIUM BROMIDE 100 MG/10ML IV SOLN
INTRAVENOUS | Status: DC | PRN
  Administered 2013-03-26: 50 mg via INTRAVENOUS

## 2013-03-26 MED ORDER — ONDANSETRON HCL 4 MG/2ML IJ SOLN
INTRAMUSCULAR | Status: DC | PRN
  Administered 2013-03-26: 4 mg via INTRAVENOUS

## 2013-03-26 MED ORDER — LIDOCAINE HCL (CARDIAC) 20 MG/ML IV SOLN
INTRAVENOUS | Status: DC | PRN
  Administered 2013-03-26: 50 mg via INTRAVENOUS

## 2013-03-26 MED ORDER — VITAMINS A & D EX OINT
TOPICAL_OINTMENT | CUTANEOUS | Status: AC
  Filled 2013-03-26: qty 5

## 2013-03-26 MED ORDER — SODIUM CHLORIDE 0.9 % IV BOLUS (SEPSIS)
1000.0000 mL | Freq: Once | INTRAVENOUS | Status: AC
  Administered 2013-03-26: 1000 mL via INTRAVENOUS

## 2013-03-26 MED ORDER — NEOSTIGMINE METHYLSULFATE 1 MG/ML IJ SOLN
INTRAMUSCULAR | Status: DC | PRN
  Administered 2013-03-26: 5 mg via INTRAVENOUS

## 2013-03-26 MED ORDER — KCL IN DEXTROSE-NACL 20-5-0.45 MEQ/L-%-% IV SOLN
INTRAVENOUS | Status: DC
  Administered 2013-03-26 – 2013-03-27 (×3): via INTRAVENOUS
  Filled 2013-03-26 (×5): qty 1000

## 2013-03-26 MED ORDER — SODIUM CHLORIDE 0.9 % IR SOLN
Status: DC | PRN
  Administered 2013-03-26: 1000 mL via INTRAVESICAL

## 2013-03-26 SURGICAL SUPPLY — 44 items
CANISTER SUCTION 2500CC (MISCELLANEOUS) ×2 IMPLANT
CATH FOLEY 2WAY SLVR 18FR 30CC (CATHETERS) ×2 IMPLANT
CATH ROBINSON RED A/P 16FR (CATHETERS) ×2 IMPLANT
CATH ROBINSON RED A/P 8FR (CATHETERS) ×2 IMPLANT
CATH TIEMANN FOLEY 18FR 5CC (CATHETERS) ×2 IMPLANT
CHLORAPREP W/TINT 26ML (MISCELLANEOUS) ×2 IMPLANT
CLIP LIGATING HEM O LOK PURPLE (MISCELLANEOUS) ×4 IMPLANT
CLOTH BEACON ORANGE TIMEOUT ST (SAFETY) ×2 IMPLANT
CORD HIGH FREQUENCY UNIPOLAR (ELECTROSURGICAL) ×2 IMPLANT
COVER SURGICAL LIGHT HANDLE (MISCELLANEOUS) ×2 IMPLANT
COVER TIP SHEARS 8 DVNC (MISCELLANEOUS) ×1 IMPLANT
COVER TIP SHEARS 8MM DA VINCI (MISCELLANEOUS) ×1
CUTTER ECHEON FLEX ENDO 45 340 (ENDOMECHANICALS) ×2 IMPLANT
DECANTER SPIKE VIAL GLASS SM (MISCELLANEOUS) ×2 IMPLANT
DRAPE SURG IRRIG POUCH 19X23 (DRAPES) ×2 IMPLANT
DRSG TEGADERM 2-3/8X2-3/4 SM (GAUZE/BANDAGES/DRESSINGS) ×8 IMPLANT
DRSG TEGADERM 4X4.75 (GAUZE/BANDAGES/DRESSINGS) ×4 IMPLANT
DRSG TEGADERM 6X8 (GAUZE/BANDAGES/DRESSINGS) ×4 IMPLANT
ELECT REM PT RETURN 9FT ADLT (ELECTROSURGICAL) ×2
ELECTRODE REM PT RTRN 9FT ADLT (ELECTROSURGICAL) ×1 IMPLANT
GAUZE SPONGE 2X2 8PLY STRL LF (GAUZE/BANDAGES/DRESSINGS) ×1 IMPLANT
GLOVE BIO SURGEON STRL SZ 6.5 (GLOVE) ×2 IMPLANT
GLOVE BIOGEL M STRL SZ7.5 (GLOVE) ×4 IMPLANT
GOWN STRL NON-REIN LRG LVL3 (GOWN DISPOSABLE) ×6 IMPLANT
GOWN STRL REIN XL XLG (GOWN DISPOSABLE) ×4 IMPLANT
HOLDER FOLEY CATH W/STRAP (MISCELLANEOUS) ×2 IMPLANT
IV LACTATED RINGERS 1000ML (IV SOLUTION) ×2 IMPLANT
KIT ACCESSORY DA VINCI DISP (KITS) ×1
KIT ACCESSORY DVNC DISP (KITS) ×1 IMPLANT
NDL SAFETY ECLIPSE 18X1.5 (NEEDLE) ×1 IMPLANT
NEEDLE HYPO 18GX1.5 SHARP (NEEDLE) ×2
PACK ROBOT UROLOGY CUSTOM (CUSTOM PROCEDURE TRAY) ×2 IMPLANT
RELOAD GREEN ECHELON 45 (STAPLE) ×2 IMPLANT
SET TUBE IRRIG SUCTION NO TIP (IRRIGATION / IRRIGATOR) ×2 IMPLANT
SOLUTION ELECTROLUBE (MISCELLANEOUS) ×2 IMPLANT
SPONGE GAUZE 2X2 STER 10/PKG (GAUZE/BANDAGES/DRESSINGS) ×1
SUT ETHILON 3 0 PS 1 (SUTURE) ×2 IMPLANT
SUT MNCRL 3 0 RB1 (SUTURE) IMPLANT
SUT MONOCRYL 3 0 RB1 (SUTURE) ×1
SUT VICRYL 0 UR6 27IN ABS (SUTURE) ×4 IMPLANT
SYR 27GX1/2 1ML LL SAFETY (SYRINGE) ×2 IMPLANT
TOWEL OR 17X26 10 PK STRL BLUE (TOWEL DISPOSABLE) ×2 IMPLANT
TOWEL OR NON WOVEN STRL DISP B (DISPOSABLE) ×2 IMPLANT
WATER STERILE IRR 1500ML POUR (IV SOLUTION) ×4 IMPLANT

## 2013-03-26 NOTE — Anesthesia Preprocedure Evaluation (Addendum)
Anesthesia Evaluation  Patient identified by MRN, date of birth, ID band Patient awake    Reviewed: Allergy & Precautions, H&P , NPO status , Patient's Chart, lab work & pertinent test results  Airway Mallampati: II TM Distance: >3 FB Neck ROM: full    Dental no notable dental hx. (+) Teeth Intact and Dental Advisory Given   Pulmonary neg pulmonary ROS, asthma , COPD COPD inhaler,  Mild COPD and asthma breath sounds clear to auscultation  Pulmonary exam normal       Cardiovascular Exercise Tolerance: Good negative cardio ROS  Rhythm:regular Rate:Normal     Neuro/Psych Anxiety negative neurological ROS  negative psych ROS   GI/Hepatic negative GI ROS, Neg liver ROS, GERD-  Controlled,  Endo/Other  negative endocrine ROS  Renal/GU negative Renal ROS  negative genitourinary   Musculoskeletal   Abdominal   Peds  Hematology negative hematology ROS (+)   Anesthesia Other Findings   Reproductive/Obstetrics negative OB ROS                          Anesthesia Physical Anesthesia Plan  ASA: II  Anesthesia Plan: General   Post-op Pain Management:    Induction: Intravenous  Airway Management Planned: Oral ETT  Additional Equipment:   Intra-op Plan:   Post-operative Plan: Extubation in OR  Informed Consent: I have reviewed the patients History and Physical, chart, labs and discussed the procedure including the risks, benefits and alternatives for the proposed anesthesia with the patient or authorized representative who has indicated his/her understanding and acceptance.   Dental Advisory Given  Plan Discussed with: CRNA and Surgeon  Anesthesia Plan Comments:         Anesthesia Quick Evaluation

## 2013-03-26 NOTE — Progress Notes (Signed)
Hgb. And Hct. Drawn by lab. 

## 2013-03-26 NOTE — Anesthesia Postprocedure Evaluation (Signed)
  Anesthesia Post-op Note  Patient: Samuel Richmond  Procedure(s) Performed: Procedure(s) (LRB): ROBOTIC ASSISTED LAPAROSCOPIC RADICAL PROSTATECTOMY LEVEL 2 (Bilateral) LYMPHADENECTOMY (Bilateral)  Patient Location: PACU  Anesthesia Type: General  Level of Consciousness: awake and alert   Airway and Oxygen Therapy: Patient Spontanous Breathing  Post-op Pain: mild  Post-op Assessment: Post-op Vital signs reviewed, Patient's Cardiovascular Status Stable, Respiratory Function Stable, Patent Airway and No signs of Nausea or vomiting  Last Vitals:  Filed Vitals:   03/26/13 1345  BP: 125/65  Pulse: 77  Temp:   Resp: 18    Post-op Vital Signs: stable   Complications: No apparent anesthesia complications

## 2013-03-26 NOTE — Progress Notes (Signed)
Hgb. 13.7- Hct. 37.9- Results noted from blood work drawn in PACU

## 2013-03-26 NOTE — Op Note (Signed)
Preoperative diagnosis: Clinically localized adenocarcinoma of the prostate (clinical stage T1c N0 M0)  Postoperative diagnosis: Clinically localized adenocarcinoma of the prostate (clinical stage T1c N0 M0)  Procedure:  1. Robotic assisted laparoscopic radical prostatectomy (bilateral nerve sparing) 2. Bilateral robotic assisted laparoscopic pelvic lymphadenectomy  Surgeon: [name]. M.D.  Assistant: Pecola Leisure, PA-C  Anesthesia: General  Complications: None  EBL: 100 mL  IVF:  1500 mL crystalloid  Specimens: 1. Prostate and seminal vesicles 2. Right pelvic lymph nodes 3. Left pelvic lymph nodes  Disposition of specimens: Pathology  Drains: 1. 20 Fr coude catheter 2. # 19 Blake pelvic drain  Indication: Samuel Richmond is a 57 y.o. year old patient with clinically localized prostate cancer.  After a thorough review of the management options for treatment of prostate cancer, he elected to proceed with surgical therapy and the above procedure(s).  We have discussed the potential benefits and risks of the procedure, side effects of the proposed treatment, the likelihood of the patient achieving the goals of the procedure, and any potential problems that might occur during the procedure or recuperation. Informed consent has been obtained.  Description of procedure:  The patient was taken to the operating room and a general anesthetic was administered. He was given preoperative antibiotics, placed in the dorsal lithotomy position, and prepped and draped in the usual sterile fashion. Next a preoperative timeout was performed. A urethral catheter was placed into the bladder and a site was selected near the umbilicus for placement of the camera port. This was placed using a standard open Hassan technique which allowed entry into the peritoneal cavity under direct vision and without difficulty. A 12 mm port was placed and a pneumoperitoneum established. The camera was  then used to inspect the abdomen and there was no evidence of any intra-abdominal injuries or other abnormalities. The remaining abdominal ports were then placed. 8 mm robotic ports were placed in the right lower quadrant, left lower quadrant, and far left lateral abdominal wall. A 5 mm port was placed in the right upper quadrant and a 12 mm port was placed in the right lateral abdominal wall for laparoscopic assistance. All ports were placed under direct vision without difficulty. The surgical cart was then docked.   Utilizing the cautery scissors, the bladder was reflected posteriorly allowing entry into the space of Retzius and identification of the endopelvic fascia and prostate. The periprostatic fat was then removed from the prostate allowing full exposure of the endopelvic fascia. The endopelvic fascia was then incised from the apex back to the base of the prostate bilaterally and the underlying levator muscle fibers were swept laterally off the prostate thereby isolating the dorsal venous complex. The dorsal vein was then stapled and divided with a 45 mm Flex Echelon stapler. Attention then turned to the bladder neck which was divided anteriorly thereby allowing entry into the bladder and exposure of the urethral catheter. The catheter balloon was deflated and the catheter was brought into the operative field and used to retract the prostate anteriorly. The posterior bladder neck was then examined and was divided allowing further dissection between the bladder and prostate posteriorly until the vasa deferentia and seminal vessels were identified. The vasa deferentia were isolated, divided, and lifted anteriorly. The seminal vesicles were dissected down to their tips with care to control the seminal vascular arterial blood supply. These structures were then lifted anteriorly and the space between Denonvillier's fascia and the anterior rectum was developed with a combination  of sharp and blunt dissection.  This isolated the vascular pedicles of the prostate.  The lateral prostatic fascia was then sharply incised allowing release of the neurovascular bundles bilaterally. The vascular pedicles of the prostate were then ligated with Weck clips between the prostate and neurovascular bundles and divided with sharp cold scissor dissection resulting in neurovascular bundle preservation. The neurovascular bundles were then separated off the apex of the prostate and urethra bilaterally.  The urethra was then sharply transected allowing the prostate specimen to be disarticulated. The pelvis was copiously irrigated and hemostasis was ensured. There was no evidence for rectal injury.  Attention then turned to the right pelvic sidewall. The fibrofatty tissue between the external iliac vein, confluence of the iliac vessels, hypogastric artery, and Cooper's ligament was dissected free from the pelvic sidewall with care to preserve the obturator nerve. Weck clips were used for lymphostasis and hemostasis. An identical procedure was performed on the contralateral side and the lymphatic packets were removed for permanent pathologic analysis.  Attention then turned to the urethral anastomosis. A 2-0 Vicryl slip knot was placed between Denonvillier's fascia, the posterior bladder neck, and the posterior urethra to reapproximate these structures. A double-armed 3-0 Monocryl suture was then used to perform a 360 running tension-free anastomosis between the bladder neck and urethra. A new urethral catheter was then placed into the bladder and irrigated. There were no blood clots within the bladder and the anastomosis appeared to be watertight. A #19 Blake drain was then brought through the left lateral 8 mm port site and positioned appropriately within the pelvis. It was secured to the skin with a nylon suture. The surgical cart was then undocked. The right lateral 12 mm port site was closed at the fascial level with a 0 Vicryl  suture placed laparoscopically. All remaining ports were then removed under direct vision. The prostate specimen was removed intact within the Endopouch retrieval bag via the periumbilical camera port site. This fascial opening was closed with two running 0 Vicryl sutures. 0.25% Marcaine was then injected into all port sites and all incisions were reapproximated at the skin level with staples. Sterile dressings were applied. The patient appeared to tolerate the procedure well and without complications. The patient was able to be extubated and transferred to the recovery unit in satisfactory condition.   [name] MD

## 2013-03-26 NOTE — Transfer of Care (Signed)
Immediate Anesthesia Transfer of Care Note  Patient: Samuel Richmond  Procedure(s) Performed: Procedure(s) with comments: ROBOTIC ASSISTED LAPAROSCOPIC RADICAL PROSTATECTOMY LEVEL 2 (Bilateral) - 3 HRS  LYMPHADENECTOMY (Bilateral)  Patient Location: PACU  Anesthesia Type:General  Level of Consciousness: awake and confused  Airway & Oxygen Therapy: Patient Spontanous Breathing and Patient connected to face mask oxygen  Post-op Assessment: Post -op Vital signs reviewed and stable and Patient moving all extremities  Post vital signs: Reviewed and stable  Complications: No apparent anesthesia complications

## 2013-03-27 ENCOUNTER — Encounter (HOSPITAL_COMMUNITY): Payer: Self-pay | Admitting: Urology

## 2013-03-27 LAB — HEMOGLOBIN AND HEMATOCRIT, BLOOD: Hemoglobin: 12.3 g/dL — ABNORMAL LOW (ref 13.0–17.0)

## 2013-03-27 MED ORDER — DSS 100 MG PO CAPS: 100.0000 mg | ORAL_CAPSULE | Freq: Two times a day (BID) | ORAL | Status: DC

## 2013-03-27 MED ORDER — SODIUM CHLORIDE 0.9 % IV BOLUS (SEPSIS)
500.0000 mL | Freq: Once | INTRAVENOUS | Status: AC
  Administered 2013-03-27: 500 mL via INTRAVENOUS

## 2013-03-27 MED ORDER — SULFAMETHOXAZOLE-TRIMETHOPRIM 800-160 MG PO TABS: 1.0000 | ORAL_TABLET | Freq: Two times a day (BID) | ORAL | Status: DC

## 2013-03-27 MED ORDER — BISACODYL 10 MG RE SUPP
10.0000 mg | Freq: Once | RECTAL | Status: AC
  Administered 2013-03-27: 10 mg via RECTAL
  Filled 2013-03-27: qty 1

## 2013-03-27 MED ORDER — HYDROCODONE-ACETAMINOPHEN 5-325 MG PO TABS
1.0000 | ORAL_TABLET | Freq: Four times a day (QID) | ORAL | Status: DC | PRN
  Administered 2013-03-27: 1 via ORAL
  Filled 2013-03-27 (×2): qty 1

## 2013-03-27 MED ORDER — HYDROCODONE-ACETAMINOPHEN 5-325 MG PO TABS: 1.0000 | ORAL_TABLET | Freq: Four times a day (QID) | ORAL | Status: DC | PRN

## 2013-03-27 NOTE — Progress Notes (Signed)
Patient with 175 cc of urine output this am.  Performed NS hand irrigation to see if patient had clots.  Able to push NS in but not pull back.  Once foley reconnected there was some output in tube.  About 100 cc emptied after flush. Patient does not feel like bladder is full, bladder scan showed 0, and JP has had no output all night.  Will continue to monitor patient.

## 2013-03-27 NOTE — Progress Notes (Signed)
Patient ID: Samuel Richmond, male   DOB: [birth year], 57 y.o.   MRN: [id number]  1 Day Post-Op Subjective: The patient is doing well.  No nausea or vomiting. Pain is adequately controlled. UOP has been low overnight.  Objective: Vital signs in last 24 hours: Temp:  [97.6 F (36.4 C)-98.3 F (36.8 C)] 98.3 F (36.8 C) (04/25 0547) Pulse Rate:  [54-82] 54 (04/25 0547) Resp:  [16-20] 18 (04/25 0547) BP: (92-140)/(53-71) 92/53 mmHg (04/25 0547) SpO2:  [95 %-100 %] 98 % (04/25 0547) Weight:  [87.09 kg (192 lb)] 87.09 kg (192 lb) (04/24 1504)  Intake/Output from previous day: 04/24 0701 - 04/25 0700 In: 6125 [P.O.:960; I.V.:4065; IV Piggyback:1100] Out: 677 [Urine:575; Drains:2; Blood:100] Intake/Output this shift:    Physical Exam:  General: Alert and oriented. CV: RRR Lungs: Clear bilaterally. GI: Soft, Nondistended. Incisions: Dressings intact. Urine: Clear Extremities: Nontender, no erythema, no edema.  Lab Results:  Recent Labs  03/26/13 1345 03/27/13 0508  HGB 13.7 12.3*  HCT 37.9* 34.6*      Assessment/Plan: POD# 1 s/p robotic prostatectomy.  1) Will give IVF fluid bolus this morning and monitor UOP.  BP is somewhat lower this morning and will continue to monitor.  No evidence to suggest bleeding with urine very clear, minimal drain output, and fairly stable Hgb this morning. 2) Ambulate, Incentive spirometry 3) Transition to oral pain medication 4) Dulcolax suppository 5) D/C pelvic drain 6) Plan for likely discharge later today if UOP improves   Samuel Richmond. MD   LOS: 1 day   Samuel Richmond,LES 03/27/2013, 7:08 AM

## 2013-03-27 NOTE — Discharge Summary (Signed)
  Date of admission: 03/26/2013  Date of discharge: 03/27/2013  Admission diagnosis: Prostate Cancer  Discharge diagnosis: Prostate Cancer  History and Physical: For full details, please see admission history and physical. Briefly, Samuel Richmond is a 57 y.o. gentleman with localized prostate cancer.  After discussing management/treatment options, he elected to proceed with surgical treatment.  Hospital Course: Samuel Richmond was taken to the operating room on 03/26/2013 and underwent a robotic assisted laparoscopic radical prostatectomy. He tolerated this procedure well and without complications. Postoperatively, he was able to be transferred to a regular hospital room following recovery from anesthesia.  He was able to begin ambulating the night of surgery. He remained hemodynamically stable overnight.  Overnight he had decreased urine output but this responded to a fluid bolus and improved on POD 1.  There was appropriately minimal output from his pelvic drain and his pelvic drain was removed on POD #1.  He was transitioned to oral pain medication, tolerated a clear liquid diet, and had met all discharge criteria and was able to be discharged home later on POD#1.  Laboratory values:  Recent Labs  03/26/13 1345 03/27/13 0508  HGB 13.7 12.3*  HCT 37.9* 34.6*    Disposition: Home  Discharge instruction: He was instructed to be ambulatory but to refrain from heavy lifting, strenuous activity, or driving. He was instructed on urethral catheter care.  Discharge medications:     Medication List    STOP taking these medications       doxazosin 4 MG tablet  Commonly known as:  CARDURA     fish oil-omega-3 fatty acids 1000 MG capsule     multivitamin with minerals Tabs      TAKE these medications       DSS 100 MG Caps  Take 100 mg by mouth 2 (two) times daily.     escitalopram 10 MG tablet  Commonly known as:  LEXAPRO  Take 10 mg by mouth every morning.     Fluticasone-Salmeterol 100-50 MCG/DOSE Aepb  Commonly known as:  ADVAIR  Inhale 1 puff into the lungs every 12 (twelve) hours.     HYDROcodone-acetaminophen 5-325 MG per tablet  Commonly known as:  NORCO/VICODIN  Take 1-2 tablets by mouth every 6 (six) hours as needed.     sulfamethoxazole-trimethoprim 800-160 MG per tablet  Commonly known as:  BACTRIM DS  Take 1 tablet by mouth 2 (two) times daily. Start the day before your appt for foley removal        Followup: He will followup in 1 week for catheter removal and to discuss his surgical pathology results.

## 2013-07-03 ENCOUNTER — Encounter (HOSPITAL_COMMUNITY): Payer: Self-pay | Admitting: Pharmacy Technician

## 2013-07-07 NOTE — Pre-Procedure Instructions (Signed)
Wednesday, August 13th, DESHAN HEMMELGARN  07/07/2013   Your procedure is scheduled on: Wednesday, August 13th.  Report to [facility] at 8:10 AM.  Call this number if you have problems the morning of surgery: [phone]   Remember:   Do not eat food or drink liquids after midnight.   Take these medicines the morning of surgery with A SIP OF WATER: escitalopram (LEXAPRO)  May use Fluticasone-Salmeterol (ADVAIR).  Stop taking Aspirin, Coumadin, Plavix, Effient and Herbal medicationOmega-3 Fatty Acids (FISH OIL).   Do not take any NSAIDs ie: Ibuprofen,  Advil,Naproxen or any medication containing Aspirin.    Do not wear jewelry, make-up or nail polish.  Do not wear lotions, powders, or perfumes. You may wear deodorant.  Men may shave face and neck.  Do not bring valuables to the hospital.  [facility] is not responsible for any belongings or valuables.  Contacts, dentures or bridgework may not be worn into surgery.  Leave suitcase in the car. After surgery it may be brought to your room.  For patients admitted to the hospital, checkout time is 11:00 AM the day of discharge.    Special Instructions: Shower using CHG 2 nights before surgery and the night before surgery.  If you shower the day of surgery use CHG.  Use special wash - you have one bottle of CHG for all showers.  You should use approximately 1/3 of the bottle for each shower.   Please read over the following fact sheets that you were given: Pain Booklet, Coughing and Deep Breathing, Blood Transfusion Information and Surgical Site Infection Prevention

## 2013-07-08 ENCOUNTER — Other Ambulatory Visit: Payer: Self-pay | Admitting: Orthopedic Surgery

## 2013-07-08 ENCOUNTER — Encounter (HOSPITAL_COMMUNITY): Payer: Self-pay

## 2013-07-08 ENCOUNTER — Encounter (HOSPITAL_COMMUNITY)
Admission: RE | Admit: 2013-07-08 | Discharge: 2013-07-08 | Disposition: A | Discharge status: Home / Self Care | Payer: Managed Care, Other (non HMO) | Source: Ambulatory Visit | Attending: Orthopedic Surgery | Admitting: Orthopedic Surgery

## 2013-07-08 DIAGNOSIS — Z01812 Encounter for preprocedural laboratory examination: Secondary | ICD-10-CM | POA: Insufficient documentation

## 2013-07-08 DIAGNOSIS — Z01818 Encounter for other preprocedural examination: Secondary | ICD-10-CM | POA: Insufficient documentation

## 2013-07-08 LAB — APTT: aPTT: 28 seconds (ref 24–37)

## 2013-07-08 LAB — BASIC METABOLIC PANEL
Calcium: 9.2 mg/dL (ref 8.4–10.5)
GFR calc Af Amer: >90 mL/min (ref >90)
GFR calc non Af Amer: >90 mL/min (ref >90)
Glucose, Bld: 95 mg/dL (ref 70–99)
Potassium: 3.9 mEq/L (ref 3.5–5.1)
Sodium: 137 mEq/L (ref 135–145)

## 2013-07-08 LAB — HEPATIC FUNCTION PANEL
ALT: 20 U/L (ref 0–53)
AST: 21 U/L (ref 0–37)
Albumin: 4.4 g/dL (ref 3.5–5.2)
Total Protein: 7.1 g/dL (ref 6.0–8.3)

## 2013-07-08 LAB — ABO/RH: ABO/RH(D): B POS

## 2013-07-08 LAB — CBC
MCH: 29.9 pg (ref 26.0–34.0)
MCHC: 34.8 g/dL (ref 30.0–36.0)
Platelets: 226 10*3/uL (ref 150–400)
RBC: 4.92 MIL/uL (ref 4.22–5.81)

## 2013-07-08 LAB — URINALYSIS, ROUTINE W REFLEX MICROSCOPIC
Bilirubin Urine: NEGATIVE
Hgb urine dipstick: NEGATIVE
Ketones, ur: NEGATIVE mg/dL
Nitrite: NEGATIVE
pH: 6 (ref 5.0–8.0)

## 2013-07-08 LAB — PROTIME-INR
INR: 1.06 (ref 0.00–1.49)
Prothrombin Time: 13.6 seconds (ref 11.6–15.2)

## 2013-07-08 LAB — TYPE AND SCREEN

## 2013-07-08 NOTE — Progress Notes (Signed)
Called Dr Greig Right office and talked to Mackey for orders.

## 2013-07-08 NOTE — Progress Notes (Signed)
BMET changed to CMET

## 2013-07-14 MED ORDER — CEFAZOLIN SODIUM-DEXTROSE 2-3 GM-% IV SOLR
2.0000 g | INTRAVENOUS | Status: AC
  Administered 2013-07-15: 2 g via INTRAVENOUS
  Filled 2013-07-14: qty 50

## 2013-07-14 NOTE — H&P (Signed)
[facility] [address]   [address] [place], [place] [address] [phone] A Division of [facility]  Loreta Ave, M.D.   Robert A. Thurston Hole, M.D.   Burnell Blanks, M.D.   Eulas Post, M.D.   Lunette Stands, M.D Buford Dresser, M.D.  Charlsie Quest, M.D.   Estell Harpin, M.D.   Melina Fiddler, M.D. Genene Churn. Barry Dienes, PA-C            Kirstin A. Shepperson, PA-C Josh Bethesda, PA-C [place], [place]   RE: Samuel Richmond                                [id number]      DOB: 06/25/1954 PROGRESS NOTE: 06-30-13 [age] year-old woman who has a many year history of left knee osteoarthritis, degenerative, end stage.  She was scheduled for a left total knee arthroplasty and unfortunately developed severe diverticulosis with infection and had to forgo the surgery.  Her symptoms are unchanged.  She continues to have pain that interferes with sleep at least once weekly and sometimes many nights a week.  She has pain that prevents easy ambulation without fear of instability due to pain.  She also has been curtailing usual activities of daily living because of pain.  She has failed conservative treatment, including steroid injection, anti-inflammatories, pain medication and lifestyle modification.  Her primary care physician is Dr. Larina Earthly.   Allergies: Codeine, Penicillin, Sulfa (she has had Celebrex without any difficulty or reaction) and shellfish.  All codones and Oxycodones have made her itch.   Past medical history: Significant for cataracts, diverticulitis and arthritis. Past surgical history: Significant for hysterectomy, hiatal hernia, right and left rotator cuff surgeries, stress fracture, gallbladder excision and meniscal surgery.  No adverse effects from anesthesia to report.   Current medications: Voltaren 75 mg which she will stop one week prior to her surgery, B complex, Calcium, B12 and Vitamin D.  She takes no  blood thinners.   Review of systems: Positive for cataracts and contacts and ankle swelling.  Patient denies any lightheadedness, dizziness, fevers, chills, cardiac, pulmonary, GI, GU or neuro issues, other than those discussed above. Social history: Patient last smoked in 1973, does drink an occasional glass of wine on the weekends.  She lives with her husband in a two story residence. Family history: Positive for cancer, heart disease and high blood pressure.    EXAMINATION: Height: 5?5.  Weight: 205 pounds.  BMI: 34.1.  Blood pressure: 143/91.  Temperature: 98.3.  Pulse: 78.  She is alert and oriented x 3 and in no acute distress.  Gait is antalgic.  Head is normocephalic, a traumatic.  PERRLA, EOMI.  Lungs: CTA bilaterally.  No wheezes.  Heart: RRR.  No murmurs.  Abdomen: Round, soft and no longer tender.  No masses noted.  Examination of the right knee shows good alignment and motion.  Left knee however has 5 degrees of varus deformity with minimal flexion contracture, flexing to 100 degrees with tibiofemoral and patellofemoral crepitus, otherwise neurovascularly intact with some loss of quadriceps strength.  Further examination does show some pain and tenderness along the right foot with some minimal swelling in an area where she has known arthritis.  Otherwise neurovascularly intact.        X-RAYS: Plain radiographs with sizing markers show end stage degenerative joint disease, bone on bone medially  of the left knee.  Continued   [facility] [address]   [address] [place], [place] [address] [phone] A Division of [facility]  Loreta Ave, M.D.   Robert A. Thurston Hole, M.D.   Burnell Blanks, M.D.   Eulas Post, M.D.   Lunette Stands, M.D Buford Dresser, M.D.  Charlsie Quest, M.D.   Estell Harpin, M.D.   Melina Fiddler, M.D. Genene Churn. Barry Dienes, PA-C            Kirstin A. Shepperson, PA-C Josh [place],  PA-C [place], [place]   RE: Samuel Richmond                                [id number]      DOB: 06/25/1954 PROGRESS NOTE: 06-30-13 IMPRESSION: Left knee degenerative joint disease, end stage.  PLAN: Patient wishes to proceed with a left total knee arthroplasty as we have previously scheduled.  Risks and benefits were discussed.  Questions were answered.  Possible complications were reviewed.  All questions answered to the patient's satisfaction.  She will try some samples of Celebrex, as well as Voltaren gel in hopes of decreasing any kind of rebound pain she might have when she comes off of her Voltaren.  She is wishing to return home postoperatively and we certainly will do everything we can to facilitate that.    Loreta Ave, M.D.   Electronically verified by Loreta Ave, M.D. DFM(BR):jjh D 07-01-13 T 07-02-13

## 2013-07-15 ENCOUNTER — Inpatient Hospital Stay (HOSPITAL_COMMUNITY): Payer: Managed Care, Other (non HMO) | Admitting: Anesthesiology

## 2013-07-15 ENCOUNTER — Encounter (HOSPITAL_COMMUNITY)
Admission: RE | Disposition: A | Discharge status: Home Health | Payer: Self-pay | Source: Ambulatory Visit | Attending: Orthopedic Surgery

## 2013-07-15 ENCOUNTER — Encounter (HOSPITAL_COMMUNITY): Payer: Self-pay | Admitting: Certified Registered Nurse Anesthetist

## 2013-07-15 ENCOUNTER — Inpatient Hospital Stay (HOSPITAL_COMMUNITY)
Admission: RE | Admit: 2013-07-15 | Discharge: 2013-07-17 | DRG: 470 | Disposition: A | Discharge status: Home Health | Payer: Managed Care, Other (non HMO) | Source: Ambulatory Visit | Attending: Orthopedic Surgery | Admitting: Orthopedic Surgery

## 2013-07-15 ENCOUNTER — Encounter (HOSPITAL_COMMUNITY): Payer: Self-pay | Admitting: Anesthesiology

## 2013-07-15 DIAGNOSIS — Z79899 Other long term (current) drug therapy: Secondary | ICD-10-CM

## 2013-07-15 DIAGNOSIS — J4489 Other specified chronic obstructive pulmonary disease: Secondary | ICD-10-CM | POA: Diagnosis present

## 2013-07-15 DIAGNOSIS — J449 Chronic obstructive pulmonary disease, unspecified: Secondary | ICD-10-CM | POA: Diagnosis present

## 2013-07-15 DIAGNOSIS — Z87312 Personal history of (healed) stress fracture: Secondary | ICD-10-CM

## 2013-07-15 DIAGNOSIS — M1712 Unilateral primary osteoarthritis, left knee: Secondary | ICD-10-CM | POA: Diagnosis present

## 2013-07-15 DIAGNOSIS — F418 Other specified anxiety disorders: Secondary | ICD-10-CM | POA: Diagnosis present

## 2013-07-15 DIAGNOSIS — J45909 Unspecified asthma, uncomplicated: Secondary | ICD-10-CM | POA: Diagnosis present

## 2013-07-15 DIAGNOSIS — M1710 Unilateral primary osteoarthritis, unspecified knee: Principal | ICD-10-CM | POA: Diagnosis present

## 2013-07-15 DIAGNOSIS — K219 Gastro-esophageal reflux disease without esophagitis: Secondary | ICD-10-CM | POA: Diagnosis present

## 2013-07-15 DIAGNOSIS — F411 Generalized anxiety disorder: Secondary | ICD-10-CM | POA: Diagnosis present

## 2013-07-15 DIAGNOSIS — F909 Attention-deficit hyperactivity disorder, unspecified type: Secondary | ICD-10-CM | POA: Diagnosis present

## 2013-07-15 DIAGNOSIS — Z7982 Long term (current) use of aspirin: Secondary | ICD-10-CM

## 2013-07-15 DIAGNOSIS — C61 Malignant neoplasm of prostate: Secondary | ICD-10-CM | POA: Diagnosis present

## 2013-07-15 DIAGNOSIS — Z8249 Family history of ischemic heart disease and other diseases of the circulatory system: Secondary | ICD-10-CM

## 2013-07-15 HISTORY — PX: TOTAL KNEE ARTHROPLASTY: SHX125

## 2013-07-15 SURGERY — ARTHROPLASTY, KNEE, TOTAL
Anesthesia: General | Site: Knee | Laterality: Left | Wound class: Clean

## 2013-07-15 MED ORDER — FENTANYL CITRATE 0.05 MG/ML IJ SOLN
INTRAMUSCULAR | Status: DC | PRN
  Administered 2013-07-15: 100 ug via INTRAVENOUS
  Administered 2013-07-15 (×4): 50 ug via INTRAVENOUS

## 2013-07-15 MED ORDER — ASPIRIN EC 325 MG PO TBEC
325.0000 mg | DELAYED_RELEASE_TABLET | Freq: Every day | ORAL | Status: DC
  Administered 2013-07-16 – 2013-07-17 (×2): 325 mg via ORAL
  Filled 2013-07-15 (×3): qty 1

## 2013-07-15 MED ORDER — LIDOCAINE HCL (CARDIAC) 20 MG/ML IV SOLN
INTRAVENOUS | Status: DC | PRN
  Administered 2013-07-15: 65 mg via INTRAVENOUS

## 2013-07-15 MED ORDER — SODIUM CHLORIDE 0.9 % IR SOLN
Status: DC | PRN
  Administered 2013-07-15: 1000 mL

## 2013-07-15 MED ORDER — ACETAMINOPHEN 325 MG PO TABS
650.0000 mg | ORAL_TABLET | Freq: Four times a day (QID) | ORAL | Status: DC | PRN
  Administered 2013-07-16: 650 mg via ORAL
  Filled 2013-07-15: qty 2

## 2013-07-15 MED ORDER — PHENOL 1.4 % MT LIQD: 1.0000 | OROMUCOSAL | Status: DC | PRN

## 2013-07-15 MED ORDER — SODIUM CHLORIDE 0.9 % IR SOLN
Status: DC | PRN
  Administered 2013-07-15: 3000 mL

## 2013-07-15 MED ORDER — ARTIFICIAL TEARS OP OINT
TOPICAL_OINTMENT | OPHTHALMIC | Status: DC | PRN
  Administered 2013-07-15: 1 via OPHTHALMIC

## 2013-07-15 MED ORDER — MIDAZOLAM HCL 2 MG/2ML IJ SOLN
INTRAMUSCULAR | Status: AC
  Administered 2013-07-15: 2 mg
  Filled 2013-07-15: qty 2

## 2013-07-15 MED ORDER — SODIUM CHLORIDE 0.9 % IJ SOLN
INTRAMUSCULAR | Status: DC | PRN
  Administered 2013-07-15: 40 mL via INTRAVENOUS

## 2013-07-15 MED ORDER — ONDANSETRON HCL 4 MG/2ML IJ SOLN
INTRAMUSCULAR | Status: DC | PRN
  Administered 2013-07-15: 4 mg via INTRAVENOUS

## 2013-07-15 MED ORDER — DEXTROSE-NACL 5-0.45 % IV SOLN
INTRAVENOUS | Status: AC
  Administered 2013-07-16: 03:00:00 via INTRAVENOUS

## 2013-07-15 MED ORDER — FENTANYL CITRATE 0.05 MG/ML IJ SOLN
100.0000 ug | Freq: Once | INTRAMUSCULAR | Status: AC
  Administered 2013-07-15: 100 ug via INTRAVENOUS

## 2013-07-15 MED ORDER — OXYCODONE HCL 5 MG PO TABS: 5.0000 mg | ORAL_TABLET | Freq: Once | ORAL | Status: DC | PRN

## 2013-07-15 MED ORDER — ONDANSETRON HCL 4 MG/2ML IJ SOLN: 4.0000 mg | Freq: Once | INTRAMUSCULAR | Status: DC | PRN

## 2013-07-15 MED ORDER — ACETAMINOPHEN 650 MG RE SUPP: 650.0000 mg | Freq: Four times a day (QID) | RECTAL | Status: DC | PRN

## 2013-07-15 MED ORDER — PROPOFOL 10 MG/ML IV BOLUS
INTRAVENOUS | Status: DC | PRN
  Administered 2013-07-15: 180 mg via INTRAVENOUS

## 2013-07-15 MED ORDER — ZOLPIDEM TARTRATE 5 MG PO TABS: 5.0000 mg | ORAL_TABLET | Freq: Every evening | ORAL | Status: DC | PRN

## 2013-07-15 MED ORDER — MIDAZOLAM HCL 5 MG/5ML IJ SOLN
INTRAMUSCULAR | Status: DC | PRN
  Administered 2013-07-15: 2 mg via INTRAVENOUS

## 2013-07-15 MED ORDER — ONDANSETRON HCL 4 MG/2ML IJ SOLN: 4.0000 mg | Freq: Four times a day (QID) | INTRAMUSCULAR | Status: DC | PRN

## 2013-07-15 MED ORDER — MOMETASONE FURO-FORMOTEROL FUM 100-5 MCG/ACT IN AERO
2.0000 | INHALATION_SPRAY | Freq: Two times a day (BID) | RESPIRATORY_TRACT | Status: DC
  Administered 2013-07-16 – 2013-07-17 (×2): 2 via RESPIRATORY_TRACT
  Filled 2013-07-15 (×2): qty 8.8

## 2013-07-15 MED ORDER — LACTATED RINGERS IV SOLN
INTRAVENOUS | Status: DC | PRN
  Administered 2013-07-15 (×2): via INTRAVENOUS

## 2013-07-15 MED ORDER — MORPHINE SULFATE 2 MG/ML IJ SOLN
2.0000 mg | INTRAMUSCULAR | Status: DC | PRN
  Administered 2013-07-15 – 2013-07-17 (×6): 2 mg via INTRAVENOUS
  Filled 2013-07-15 (×6): qty 1

## 2013-07-15 MED ORDER — HYDROMORPHONE HCL PF 1 MG/ML IJ SOLN
INTRAMUSCULAR | Status: AC
  Filled 2013-07-15: qty 1

## 2013-07-15 MED ORDER — CHLORHEXIDINE GLUCONATE 4 % EX LIQD: 60.0000 mL | Freq: Once | CUTANEOUS | Status: DC

## 2013-07-15 MED ORDER — ESCITALOPRAM OXALATE 10 MG PO TABS
10.0000 mg | ORAL_TABLET | Freq: Every morning | ORAL | Status: DC
  Administered 2013-07-16 – 2013-07-17 (×2): 10 mg via ORAL
  Filled 2013-07-15 (×2): qty 1

## 2013-07-15 MED ORDER — METHOCARBAMOL 500 MG PO TABS
500.0000 mg | ORAL_TABLET | Freq: Four times a day (QID) | ORAL | Status: DC | PRN
  Administered 2013-07-16 – 2013-07-17 (×4): 500 mg via ORAL
  Filled 2013-07-15 (×4): qty 1

## 2013-07-15 MED ORDER — CEFAZOLIN SODIUM-DEXTROSE 2-3 GM-% IV SOLR
2.0000 g | Freq: Four times a day (QID) | INTRAVENOUS | Status: AC
  Administered 2013-07-15 – 2013-07-16 (×2): 2 g via INTRAVENOUS
  Filled 2013-07-15 (×2): qty 50

## 2013-07-15 MED ORDER — METHOCARBAMOL 100 MG/ML IJ SOLN
500.0000 mg | Freq: Four times a day (QID) | INTRAVENOUS | Status: DC | PRN
  Filled 2013-07-15: qty 5

## 2013-07-15 MED ORDER — HYDROMORPHONE HCL PF 1 MG/ML IJ SOLN
0.2500 mg | INTRAMUSCULAR | Status: DC | PRN
  Administered 2013-07-15: 0.5 mg via INTRAVENOUS

## 2013-07-15 MED ORDER — LACTATED RINGERS IV SOLN
INTRAVENOUS | Status: DC
  Administered 2013-07-15: 20 mL/h via INTRAVENOUS

## 2013-07-15 MED ORDER — METOCLOPRAMIDE HCL 5 MG/ML IJ SOLN: 5.0000 mg | Freq: Three times a day (TID) | INTRAMUSCULAR | Status: DC | PRN

## 2013-07-15 MED ORDER — GLYCOPYRROLATE 0.2 MG/ML IJ SOLN
INTRAMUSCULAR | Status: DC | PRN
  Administered 2013-07-15: 0.2 mg via INTRAVENOUS

## 2013-07-15 MED ORDER — METOCLOPRAMIDE HCL 10 MG PO TABS: 5.0000 mg | ORAL_TABLET | Freq: Three times a day (TID) | ORAL | Status: DC | PRN

## 2013-07-15 MED ORDER — OXYCODONE HCL 5 MG PO TABS
5.0000 mg | ORAL_TABLET | ORAL | Status: DC | PRN
  Administered 2013-07-15 – 2013-07-17 (×10): 10 mg via ORAL
  Filled 2013-07-15 (×10): qty 2

## 2013-07-15 MED ORDER — ACETAMINOPHEN 500 MG PO TABS
1000.0000 mg | ORAL_TABLET | Freq: Four times a day (QID) | ORAL | Status: AC
  Administered 2013-07-15 – 2013-07-16 (×3): 1000 mg via ORAL
  Filled 2013-07-15 (×4): qty 2

## 2013-07-15 MED ORDER — OXYCODONE HCL 5 MG/5ML PO SOLN
5.0000 mg | Freq: Once | ORAL | Status: DC | PRN
Start: 2013-07-15 — End: 2013-07-15

## 2013-07-15 MED ORDER — BUPIVACAINE LIPOSOME 1.3 % IJ SUSP
INTRAMUSCULAR | Status: DC | PRN
  Administered 2013-07-15: 20 mL

## 2013-07-15 MED ORDER — DEXAMETHASONE SODIUM PHOSPHATE 4 MG/ML IJ SOLN
INTRAMUSCULAR | Status: DC | PRN
  Administered 2013-07-15: 8 mg via INTRAVENOUS

## 2013-07-15 MED ORDER — ONDANSETRON HCL 4 MG PO TABS: 4.0000 mg | ORAL_TABLET | Freq: Four times a day (QID) | ORAL | Status: DC | PRN

## 2013-07-15 MED ORDER — FENTANYL CITRATE 0.05 MG/ML IJ SOLN
INTRAMUSCULAR | Status: AC
  Filled 2013-07-15: qty 2

## 2013-07-15 MED ORDER — BUPIVACAINE LIPOSOME 1.3 % IJ SUSP
20.0000 mL | INTRAMUSCULAR | Status: DC
  Filled 2013-07-15: qty 20

## 2013-07-15 MED ORDER — LACTATED RINGERS IV SOLN: INTRAVENOUS | Status: DC

## 2013-07-15 MED ORDER — MENTHOL 3 MG MT LOZG: 1.0000 | LOZENGE | OROMUCOSAL | Status: DC | PRN

## 2013-07-15 SURGICAL SUPPLY — 65 items
BANDAGE ELASTIC 4 VELCRO ST LF (GAUZE/BANDAGES/DRESSINGS) ×1 IMPLANT
BANDAGE ELASTIC 6 VELCRO ST LF (GAUZE/BANDAGES/DRESSINGS) ×1 IMPLANT
BANDAGE ESMARK 6X9 LF (GAUZE/BANDAGES/DRESSINGS) ×1 IMPLANT
BLADE SAG 18X100X1.27 (BLADE) ×4 IMPLANT
BNDG CMPR 9X6 STRL LF SNTH (GAUZE/BANDAGES/DRESSINGS) ×1
BNDG ESMARK 6X9 LF (GAUZE/BANDAGES/DRESSINGS) ×2
BOWL SMART MIX CTS (DISPOSABLE) ×2 IMPLANT
CEMENT BONE SIMPLEX SPEEDSET (Cement) ×4 IMPLANT
CLOTH BEACON ORANGE TIMEOUT ST (SAFETY) ×2 IMPLANT
COVER SURGICAL LIGHT HANDLE (MISCELLANEOUS) ×2 IMPLANT
CUFF TOURNIQUET SINGLE 34IN LL (TOURNIQUET CUFF) ×2 IMPLANT
DRAPE EXTREMITY T 121X128X90 (DRAPE) ×2 IMPLANT
DRAPE PROXIMA HALF (DRAPES) ×2 IMPLANT
DRAPE U-SHAPE 47X51 STRL (DRAPES) ×2 IMPLANT
DRSG PAD ABDOMINAL 8X10 ST (GAUZE/BANDAGES/DRESSINGS) ×2 IMPLANT
DURAPREP 26ML APPLICATOR (WOUND CARE) ×2 IMPLANT
ELECT CAUTERY BLADE 6.4 (BLADE) ×2 IMPLANT
ELECT REM PT RETURN 9FT ADLT (ELECTROSURGICAL) ×2
ELECTRODE REM PT RTRN 9FT ADLT (ELECTROSURGICAL) ×1 IMPLANT
EVACUATOR 1/8 PVC DRAIN (DRAIN) ×2 IMPLANT
FACESHIELD LNG OPTICON STERILE (SAFETY) ×2 IMPLANT
GAUZE XEROFORM 5X9 LF (GAUZE/BANDAGES/DRESSINGS) ×2 IMPLANT
GLOVE BIOGEL M 7.0 STRL (GLOVE) ×1 IMPLANT
GLOVE BIOGEL PI IND STRL 7.5 (GLOVE) IMPLANT
GLOVE BIOGEL PI INDICATOR 7.5 (GLOVE) ×3
GLOVE ORTHO TXT STRL SZ7.5 (GLOVE) ×2 IMPLANT
GOWN PREVENTION PLUS XLARGE (GOWN DISPOSABLE) ×4 IMPLANT
GOWN STRL NON-REIN LRG LVL3 (GOWN DISPOSABLE) ×4 IMPLANT
HANDPIECE INTERPULSE COAX TIP (DISPOSABLE) ×2
HOOD PEEL AWAY FACE SHEILD DIS (HOOD) ×1 IMPLANT
IMMOBILIZER KNEE 22 UNIV (SOFTGOODS) ×2 IMPLANT
IMMOBILIZER KNEE 24 THIGH 36 (MISCELLANEOUS) IMPLANT
IMMOBILIZER KNEE 24 UNIV (MISCELLANEOUS)
KIT BASIN OR (CUSTOM PROCEDURE TRAY) ×2 IMPLANT
KIT ROOM TURNOVER OR (KITS) ×2 IMPLANT
KNEE ORTHOSENSOR TRIATH SZ 6 (DISPOSABLE) ×1 IMPLANT
KNEE/VIT E POLY LINER LEVEL 1B ×1 IMPLANT
MANIFOLD NEPTUNE II (INSTRUMENTS) ×2 IMPLANT
NDL 18GX1X1/2 (RX/OR ONLY) (NEEDLE) ×1 IMPLANT
NDL HYPO 25GX1X1/2 BEV (NEEDLE) ×1 IMPLANT
NEEDLE 18GX1X1/2 (RX/OR ONLY) (NEEDLE) ×2 IMPLANT
NEEDLE HYPO 25GX1X1/2 BEV (NEEDLE) ×2 IMPLANT
NS IRRIG 1000ML POUR BTL (IV SOLUTION) ×2 IMPLANT
PACK TOTAL JOINT (CUSTOM PROCEDURE TRAY) ×2 IMPLANT
PAD ARMBOARD 7.5X6 YLW CONV (MISCELLANEOUS) ×4 IMPLANT
PAD CAST 4YDX4 CTTN HI CHSV (CAST SUPPLIES) ×1 IMPLANT
PADDING CAST ABS 4INX4YD NS (CAST SUPPLIES) ×1
PADDING CAST ABS 6INX4YD NS (CAST SUPPLIES) ×1
PADDING CAST ABS COTTON 4X4 ST (CAST SUPPLIES) IMPLANT
PADDING CAST ABS COTTON 6X4 NS (CAST SUPPLIES) IMPLANT
PADDING CAST COTTON 4X4 STRL (CAST SUPPLIES) ×2
PADDING CAST COTTON 6X4 STRL (CAST SUPPLIES) ×2 IMPLANT
SET HNDPC FAN SPRY TIP SCT (DISPOSABLE) ×1 IMPLANT
SPONGE GAUZE 4X4 12PLY (GAUZE/BANDAGES/DRESSINGS) ×2 IMPLANT
STAPLER VISISTAT 35W (STAPLE) ×2 IMPLANT
SUCTION FRAZIER TIP 10 FR DISP (SUCTIONS) ×2 IMPLANT
SUT VIC AB 1 CTX 36 (SUTURE) ×4
SUT VIC AB 1 CTX36XBRD ANBCTR (SUTURE) ×2 IMPLANT
SUT VIC AB 2-0 CT1 27 (SUTURE) ×4
SUT VIC AB 2-0 CT1 TAPERPNT 27 (SUTURE) ×2 IMPLANT
SYR CONTROL 10ML LL (SYRINGE) ×2 IMPLANT
TOWEL OR 17X24 6PK STRL BLUE (TOWEL DISPOSABLE) ×2 IMPLANT
TOWEL OR 17X26 10 PK STRL BLUE (TOWEL DISPOSABLE) ×2 IMPLANT
TRAY FOLEY CATH 16FRSI W/METER (SET/KITS/TRAYS/PACK) ×2 IMPLANT
WATER STERILE IRR 1000ML POUR (IV SOLUTION) ×4 IMPLANT

## 2013-07-15 NOTE — Transfer of Care (Signed)
Immediate Anesthesia Transfer of Care Note  Patient: Samuel Richmond  Procedure(s) Performed: Procedure(s): TOTAL KNEE ARTHROPLASTY (Left)  Patient Location: PACU  Anesthesia Type:General  Level of Consciousness: awake, alert , oriented and patient cooperative  Airway & Oxygen Therapy: Patient Spontanous Breathing and Patient connected to face mask oxygen  Post-op Assessment: Report given to PACU RN, Post -op Vital signs reviewed and stable and Patient moving all extremities X 4  Post vital signs: Reviewed and stable  Complications: No apparent anesthesia complications

## 2013-07-15 NOTE — Preoperative (Signed)
Beta Blockers   Reason not to administer Beta Blockers:Not Applicable 

## 2013-07-15 NOTE — Progress Notes (Signed)
Orthopedic Tech Progress Note Patient Details:  Samuel Richmond 06/20/1956 [id number] On cpm at 7:55 pm LLE 0-75 Patient ID: Samuel Richmond, male   DOB: [birth year], 57 y.o.   MRN: [id number]   Samuel Richmond 07/15/2013, 7:55 PM

## 2013-07-15 NOTE — Progress Notes (Signed)
Orthopedic Tech Progress Note Patient Details:  Samuel Richmond 07-14-1956 [id number]  CPM Left Knee CPM Left Knee: On Left Knee Flexion (Degrees): 60 Left Knee Extension (Degrees): 0 Additional Comments: trapeze bar patient helper Viewed order from doctor's order list  Nikki Dom 07/15/2013, 2:35 PM

## 2013-07-15 NOTE — Progress Notes (Signed)
Utilization review complete. Isidoro Donning RN CCM Case Mgmt [phone]

## 2013-07-15 NOTE — Anesthesia Procedure Notes (Addendum)
  Narrative:    Anesthesia Regional Block:  Femoral nerve block  Pre-Anesthetic Checklist: ,, timeout performed, Correct Patient, Correct Site, Correct Laterality, Correct Procedure, Correct Position, site marked, Risks and benefits discussed,  Surgical consent,  Pre-op evaluation,  At surgeon's request and post-op pain management  Laterality: Left  Prep: chloraprep       Needles:   Needle Type: Echogenic Stimulator Needle      Needle Gauge: 22 and 22 G    Additional Needles:  Procedures: ultrasound guided (picture in chart) Femoral nerve block Narrative:  Start time: 07/15/2013 10:50 AM End time: 07/15/2013 10:55 AM Injection made incrementally with aspirations every 5 mL.  Performed by: Personally   Additional Notes: 25 cc 0.5% marcaine 1:200 epi injected easily

## 2013-07-15 NOTE — Interval H&P Note (Signed)
History and Physical Interval Note:  07/15/2013 8:13 AM  [name]  has presented today for surgery, with the diagnosis of DJD LEFT LMEE  The various methods of treatment have been discussed with the patient and family. After consideration of risks, benefits and other options for treatment, the patient has consented to  Procedure(s): TOTAL KNEE ARTHROPLASTY (Left) as a surgical intervention .  The patient's history has been reviewed, patient examined, no change in status, stable for surgery.  I have reviewed the patient's chart and labs.  Questions were answered to the patient's satisfaction.     Samuel Richmond

## 2013-07-15 NOTE — Anesthesia Preprocedure Evaluation (Signed)
Anesthesia Evaluation  Patient identified by MRN, date of birth, ID band Patient awake    Reviewed: Allergy & Precautions, H&P , NPO status , Patient's Chart, lab work & pertinent test results  History of Anesthesia Complications Negative for: history of anesthetic complications  Airway Mallampati: II TM Distance: >3 FB Neck ROM: Full    Dental  (+) Teeth Intact and Dental Advisory Given   Pulmonary asthma , COPD COPD inhaler, former smoker,    Pulmonary exam normal       Cardiovascular negative cardio ROS  Rhythm:Regular Rate:Normal     Neuro/Psych Anxiety    GI/Hepatic Neg liver ROS, GERD-  Medicated and Controlled,  Endo/Other  negative endocrine ROS  Renal/GU negative Renal ROS     Musculoskeletal negative musculoskeletal ROS (+)   Abdominal Normal abdominal exam  (+)   Peds  Hematology negative hematology ROS (+)   Anesthesia Other Findings   Reproductive/Obstetrics                           Anesthesia Physical Anesthesia Plan  ASA: II  Anesthesia Plan: General and Regional   Post-op Pain Management:    Induction: Intravenous  Airway Management Planned: LMA and Oral ETT  Additional Equipment:   Intra-op Plan:   Post-operative Plan: Extubation in OR  Informed Consent: I have reviewed the patients History and Physical, chart, labs and discussed the procedure including the risks, benefits and alternatives for the proposed anesthesia with the patient or authorized representative who has indicated his/her understanding and acceptance.   Dental advisory given  Plan Discussed with: CRNA and Anesthesiologist  Anesthesia Plan Comments:         Anesthesia Quick Evaluation

## 2013-07-16 ENCOUNTER — Encounter (HOSPITAL_COMMUNITY): Payer: Self-pay | Admitting: General Practice

## 2013-07-16 ENCOUNTER — Inpatient Hospital Stay (HOSPITAL_COMMUNITY): Payer: Managed Care, Other (non HMO)

## 2013-07-16 DIAGNOSIS — M1712 Unilateral primary osteoarthritis, left knee: Secondary | ICD-10-CM | POA: Diagnosis present

## 2013-07-16 LAB — CBC
MCH: 30.2 pg (ref 26.0–34.0)
Platelets: 242 10*3/uL (ref 150–400)
RBC: 4.1 MIL/uL — ABNORMAL LOW (ref 4.22–5.81)
WBC: 12 10*3/uL — ABNORMAL HIGH (ref 4.0–10.5)

## 2013-07-16 MED ORDER — METHOCARBAMOL 500 MG PO TABS: 500.0000 mg | ORAL_TABLET | Freq: Four times a day (QID) | ORAL | Status: DC | PRN

## 2013-07-16 MED ORDER — CELECOXIB 200 MG PO CAPS: 200.0000 mg | ORAL_CAPSULE | Freq: Every day | ORAL | Status: DC

## 2013-07-16 MED ORDER — ASPIRIN 325 MG PO TABS: ORAL_TABLET | ORAL | Status: DC

## 2013-07-16 MED ORDER — DOCUSATE SODIUM 100 MG PO CAPS
100.0000 mg | ORAL_CAPSULE | Freq: Two times a day (BID) | ORAL | Status: DC
  Administered 2013-07-16 – 2013-07-17 (×2): 100 mg via ORAL
  Filled 2013-07-16 (×3): qty 1

## 2013-07-16 MED ORDER — OXYCODONE HCL 5 MG PO TABS: ORAL_TABLET | ORAL | Status: DC

## 2013-07-16 MED ORDER — MAGNESIUM HYDROXIDE 400 MG/5ML PO SUSP
30.0000 mL | Freq: Every day | ORAL | Status: DC | PRN
  Administered 2013-07-16: 30 mL via ORAL
  Filled 2013-07-16: qty 30

## 2013-07-16 MED ORDER — DSS 100 MG PO CAPS: ORAL_CAPSULE | ORAL | Status: DC

## 2013-07-16 MED ORDER — BISACODYL 5 MG PO TBEC: DELAYED_RELEASE_TABLET | ORAL | Status: DC

## 2013-07-16 NOTE — Evaluation (Signed)
Occupational Therapy Evaluation Patient Details Name: Samuel Richmond MRN: [id number] DOB: 11/06/1956 Today's Date: 07/16/2013 Time: 1478-2956 OT Time Calculation (min): 17 min  OT Assessment / Plan / Recommendation History of present illness s/p elective Lt Knee    Clinical Impression   Pt doing well. Overall at supervision to min guard assist level with ADL. Wife will be with pt at d/c. All OT education completed.    OT Assessment  Patient does not need any further OT services    Follow Up Recommendations  No OT follow up;Supervision - Intermittent    Barriers to Discharge      Equipment Recommendations  None recommended by OT    Recommendations for Other Services    Frequency       Precautions / Restrictions Precautions Precautions: Knee Precaution Booklet Issued: Yes (comment) Precaution Comments: pt given TKA HEP protocol  Restrictions Weight Bearing Restrictions: Yes LLE Weight Bearing: Weight bearing as tolerated   Pertinent Vitals/Pain No complaint of    ADL  Eating/Feeding: Independent;Performed Where Assessed - Eating/Feeding: Chair Grooming: Simulated;Wash/dry hands;Set up Where Assessed - Grooming: Supported sitting Upper Body Bathing: Simulated;Chest;Right arm;Left arm;Abdomen;Set up Where Assessed - Upper Body Bathing: Unsupported sitting Lower Body Bathing: Simulated;Supervision/safety Where Assessed - Lower Body Bathing: Supported sit to stand Upper Body Dressing: Simulated;Set up Where Assessed - Upper Body Dressing: Unsupported sitting Lower Body Dressing: Simulated;Supervision/safety Where Assessed - Lower Body Dressing: Supported sit to stand Toilet Transfer: Research scientist (life sciences) Method: Other (comment) (with walker into bathroom) Toilet Transfer Equipment: Raised toilet seat with arms (or 3-in-1 over toilet) Toileting - Clothing Manipulation and Hygiene: Simulated;Supervision/safety Where Assessed - Toileting Clothing  Manipulation and Hygiene: Sit to stand from 3-in-1 or toilet Tub/Shower Transfer: Simulated;Min guard Tub/Shower Transfer Method:  (step back over ledge) Equipment Used: Rolling walker ADL Comments: Pt can already reach all the way to L foot to perform LB dressing. Wife present for education. Discussed and practiced shower transfer and how wife is to assist wtih steadying walker, etc. Pt has a bult in seat but also discussed 3in1 as option to have armrests for UE support.     OT Diagnosis:    OT Problem List:   OT Treatment Interventions:     OT Goals(Current goals can be found in the care plan section) Acute Rehab OT Goals Patient Stated Goal: to walk without the walker  Visit Information  Last OT Received On: 07/16/13 Assistance Needed: +1 History of Present Illness: s/p elective Lt Knee        Prior Functioning     Home Living Family/patient expects to be discharged to:: Private residence Living Arrangements: Spouse/significant other Available Help at Discharge: Family;Available 24 hours/day Type of Home: House Home Access: Stairs to enter [employer] of Steps: 2 Entrance Stairs-Rails: Left (door frame and rail) Home Layout: Two level Alternate Level Stairs-Number of Steps: 12 Alternate Level Stairs-Rails: Can reach both Home Equipment: Walker - 2 wheels;Bedside commode Additional Comments: walk in shower on 2nd floor Prior Function Level of Independence: Independent Communication Communication: No difficulties Dominant Hand: Left         Vision/Perception     Cognition  Cognition Arousal/Alertness: Awake/alert Behavior During Therapy: WFL for tasks assessed/performed Overall Cognitive Status: Within Functional Limits for tasks assessed    Extremity/Trunk Assessment Upper Extremity Assessment Upper Extremity Assessment: Overall WFL for tasks assessed Lower Extremity Assessment Lower Extremity Assessment: LLE deficits/detail;Generalized  weakness LLE Deficits / Details: knee Grossly 3/5; pt able to perform LAQ and  SLR with full ROM; knee flexion 80 degrees AROM in sitting LLE Sensation:  (WFL to light touch on toes; pt wearing ACE bandage ) Cervical / Trunk Assessment Cervical / Trunk Assessment: Normal     Mobility  Transfers Transfers: Sit to Stand;Stand to Sit Sit to Stand: 5: Supervision;With upper extremity assist;From chair/3-in-1 Stand to Sit: 5: Supervision;With upper extremity assist;To chair/3-in-1 Details for Transfer Assistance: min verbal cues to back all the way up to 3in1 before sitting. He tried to let go of walker before all the way to 3in1.        Balance Balance Balance Assessed: Yes Static Standing Balance Static Standing - Balance Support: Bilateral upper extremity supported;During functional activity Static Standing - Level of Assistance: 5: Stand by assistance Dynamic Standing Balance Dynamic Standing - Level of Assistance: 5: Stand by assistance   End of Session OT - End of Session Activity Tolerance: Patient tolerated treatment well Patient left: in chair;with call bell/phone within reach;with family/visitor present CPM Left Knee CPM Left Knee: Off  GO     Lennox Laity [phone] 07/16/2013, 12:21 PM

## 2013-07-16 NOTE — Anesthesia Postprocedure Evaluation (Signed)
Anesthesia Post Note  Patient: Samuel Richmond  Procedure(s) Performed: Procedure(s) (LRB): TOTAL KNEE ARTHROPLASTY (Left)  Anesthesia type: General  Patient location: PACU  Post pain: Pain level controlled and Adequate analgesia  Post assessment: Post-op Vital signs reviewed, Patient's Cardiovascular Status Stable, Respiratory Function Stable, Patent Airway and Pain level controlled  Last Vitals:  Filed Vitals:   07/16/13 0634  BP: 109/58  Pulse: 64  Temp: 36.6 C  Resp: 16    Post vital signs: Reviewed and stable  Level of consciousness: awake, alert  and oriented  Complications: No apparent anesthesia complications

## 2013-07-16 NOTE — Progress Notes (Signed)
Orthopedic Tech Progress Note Patient Details:  DOREN KASPAR 11/14/1956 [id number] On cpm at 8:10 pm LLE 0-75 Patient ID: JAKYREN FLUEGGE, male   DOB: May 31, 1956, 57 y.o.   MRN: [id number]   Jennye Moccasin 07/16/2013, 8:12 PM

## 2013-07-16 NOTE — Evaluation (Signed)
Physical Therapy Evaluation Patient Details Name: Samuel Richmond MRN: [id number] DOB: 1955/12/05 Today's Date: 07/16/2013 Time: 4098-1191 PT Time Calculation (min): 33 min  PT Assessment / Plan / Recommendation History of Present Illness  s/p elective Lt Knee   Clinical Impression  Pt is s/p Lt TKA POD# 1  resulting in the deficits listed below (see PT Problem List). Pt will benefit from skilled PT to increase their independence and safety with mobility to allow discharge to the venue listed below. Pt moving well with therapy this morning. Anticipate steady progress and D/C home with HHPT and wife when medically stable, possibly tomorrow. Pt highly motivated to amb without AD.      PT Assessment  Patient needs continued PT services    Follow Up Recommendations  Home health PT;Supervision - Intermittent;Supervision for mobility/OOB    Does the patient have the potential to tolerate intense rehabilitation      Barriers to Discharge   none    Equipment Recommendations  Other (comment) (all DME has been delievered )    Recommendations for Other Services OT consult   Frequency 7X/week    Precautions / Restrictions Precautions Precautions: Knee Precaution Booklet Issued: Yes (comment) Precaution Comments: pt given TKA HEP protocol  Restrictions Weight Bearing Restrictions: Yes LLE Weight Bearing: Weight bearing as tolerated   Pertinent Vitals/Pain 4/10; pt premedicated      Mobility  Bed Mobility Bed Mobility: Supine to Sit Supine to Sit: 5: Supervision;HOB elevated;With rails Details for Bed Mobility Assistance: no physical (A) needed; pt able to advance Lt LE to EOB on Lt side of bed; used handrails minimally and HOB elevated to 30 degrees; min vc's for sequencing and safety  Transfers Transfers: Sit to Stand;Stand to Sit Sit to Stand: 4: Min guard;From bed Stand to Sit: To chair/3-in-1;With armrests;5: Supervision Details for Transfer Assistance: min guard for safety  for inital sit to stand; pt was steady with transfer; vc's for hand placement and safety; pt demo good safety awareness for transfers; no physical (A) needed   Ambulation/Gait Ambulation/Gait Assistance: 5: Supervision Ambulation Distance (Feet): 150 Feet Assistive device: Rolling walker Ambulation/Gait Assistance Details: pt able to progress to step through gt with vc's and demo; pt able to WB > 75% on Lt LE; pt intially amb with fwd flex posture and neck; correctable with vc's; vc's for gt sequencing and safety with RW  Gait Pattern: Step-through pattern;Trunk flexed;Narrow base of support (decreased Lt knee extension in stance ) Gait velocity: initially decreased; pt able to increase with cues steadily  Stairs: No Wheelchair Mobility Wheelchair Mobility: No    Exercises Total Joint Exercises Ankle Circles/Pumps: AROM;Seated;20 reps;Supine Quad Sets: AROM;Left;10 reps;Seated Hip ABduction/ADduction: AROM;Left;10 reps;Seated Straight Leg Raises: AROM;5 reps;Seated Long Arc Quad: AROM;Left;10 reps;Seated Goniometric ROM: see LE assessment    PT Diagnosis: Abnormality of gait;Acute pain;Generalized weakness  PT Problem List: Decreased strength;Decreased range of motion;Decreased mobility;Decreased knowledge of use of DME;Pain PT Treatment Interventions: DME instruction;Gait training;Stair training;Functional mobility training;Therapeutic activities;Therapeutic exercise;Balance training;Neuromuscular re-education;Patient/family education     PT Goals(Current goals can be found in the care plan section) Acute Rehab PT Goals Patient Stated Goal: to walk without the walker PT Goal Formulation: With patient Potential to Achieve Goals: Good  Visit Information  Last PT Received On: 07/16/13 Assistance Needed: +1 History of Present Illness: s/p elective Lt Knee        Prior Functioning  Home Living Family/patient expects to be discharged to:: Private residence Living Arrangements:  Spouse/significant other Available  Help at Discharge: Family;Available 24 hours/day Type of Home: House Home Access: Stairs to enter [employer] of Steps: 2 Entrance Stairs-Rails: Left (door frame and rail) Home Layout: Two level Alternate Level Stairs-Number of Steps: 12 Alternate Level Stairs-Rails: Can reach both Home Equipment: Walker - 2 wheels;Bedside commode Additional Comments: walk in shower on 2nd floor Prior Function Level of Independence: Independent Communication Communication: No difficulties Dominant Hand: Left    Cognition  Cognition Arousal/Alertness: Awake/alert Behavior During Therapy: WFL for tasks assessed/performed Overall Cognitive Status: Within Functional Limits for tasks assessed    Extremity/Trunk Assessment Upper Extremity Assessment Upper Extremity Assessment: Defer to OT evaluation Lower Extremity Assessment Lower Extremity Assessment: LLE deficits/detail;Generalized weakness LLE Deficits / Details: knee Grossly 3/5; pt able to perform LAQ and SLR with full ROM; knee flexion 80 degrees AROM in sitting LLE Sensation:  (WFL to light touch on toes; pt wearing ACE bandage ) Cervical / Trunk Assessment Cervical / Trunk Assessment: Normal   Balance Balance Balance Assessed: Yes Static Standing Balance Static Standing - Balance Support: Bilateral upper extremity supported;During functional activity Static Standing - Level of Assistance: 5: Stand by assistance  End of Session PT - End of Session Equipment Utilized During Treatment: Gait belt Activity Tolerance: Patient tolerated treatment well Patient left: in chair;with call bell/phone within reach;with family/visitor present Nurse Communication: Mobility status CPM Left Knee CPM Left Knee: [address]  GP     Donnamarie Poag [place], [place] [phone] 07/16/2013, 9:26 AM

## 2013-07-16 NOTE — Discharge Summary (Signed)
Patient ID: Samuel Richmond MRN: [id number] DOB/AGE: 1956/04/23 57 y.o.  Admit date: 07/15/2013 Discharge date: 07/17/2013  Admission Diagnoses:  Principal Problem:   Left knee DJD Active Problems:   ANXIETY DISORDER   ASTHMA   COPD   GERD   Prostate cancer   Discharge Diagnoses:  Same  Past Medical History  Diagnosis Date  . Asthma   . Attention deficit hyperactivity disorder (ADHD)   . Dyslipidemia   . Esophageal reflux   . Elevated PSA 11/04/2012    5.33  . Prostate cancer 02/10/13    gleason 6 (3+3) and 7 (4+3) on right side, volume 28.87 cc  . COPD (chronic obstructive pulmonary disease)   . Arthritis   . Benign prostatic hypertrophy     Surgeries: Procedure(s): TOTAL KNEE ARTHROPLASTY on 07/15/2013   Consultants:    Discharged Condition: Improved  Hospital Course: Samuel Richmond is an 57 y.o. male who was admitted 07/15/2013 for operative treatment ofLeft knee DJD. Patient has severe unremitting pain that affects sleep, daily activities, and work/hobbies. After pre-op clearance the patient was taken to the operating room on 07/15/2013 and underwent  Procedure(s): TOTAL KNEE ARTHROPLASTY.    Patient was given perioperative antibiotics: Anti-infectives   Start     Dose/Rate Route Frequency Ordered Stop   07/15/13 2000  ceFAZolin (ANCEF) IVPB 2 g/50 mL premix     2 g 100 mL/hr over 30 Minutes Intravenous Every 6 hours 07/15/13 1621 07/16/13 0328   07/15/13 0600  ceFAZolin (ANCEF) IVPB 2 g/50 mL premix     2 g 100 mL/hr over 30 Minutes Intravenous On call to O.R. 07/14/13 1434 07/15/13 1148       Patient was given sequential compression devices, early ambulation, and chemoprophylaxis to prevent DVT.  Patient benefited maximally from hospital stay and there were no complications.    Recent vital signs: Patient Vitals for the past 24 hrs:  BP Temp Temp src Pulse Resp SpO2  07/16/13 2040 108/60 mmHg 98.8 F (37.1 C) Oral 62 18 99 %  07/16/13 1300 110/75  mmHg 98.1 F (36.7 C) - 52 17 100 %  07/16/13 0634 109/58 mmHg 97.8 F (36.6 C) Oral 64 16 100 %  07/16/13 0227 94/60 mmHg 98 F (36.7 C) Oral 65 16 100 %  07/16/13 0000 - - - - 16 -  07/15/13 2207 99/59 mmHg 98.4 F (36.9 C) Oral 64 18 99 %     Recent laboratory studies:  Recent Labs  07/16/13 0640  WBC 12.0*  HGB 12.4*  HCT 34.7*  PLT 242     Discharge Medications:     Medication List    STOP taking these medications       ibuprofen 200 MG tablet  Commonly known as:  ADVIL,MOTRIN      TAKE these medications       aspirin 325 MG tablet  1 tablet daily for 1 month to prevent blood clots     bisacodyl 5 MG EC tablet  Commonly known as:  DULCOLAX  Take 2 tablets every night with dinner until bowel movement.  LAXITIVE.  Restart if two days since last bowel movement     CALCIUM 600+D 600-200 MG-UNIT Tabs  Generic drug:  Calcium Carbonate-Vitamin D  Take 1 tablet by mouth 2 (two) times daily.     celecoxib 200 MG capsule  Commonly known as:  CELEBREX  Take 1 capsule (200 mg total) by mouth daily.     DSS 100 MG  Caps  1 tab 2 times a day while on narcotics.  STOOL SOFTENER     escitalopram 10 MG tablet  Commonly known as:  LEXAPRO  Take 10 mg by mouth every morning.     Fish Oil 1000 MG Caps  Take 1 capsule by mouth 2 (two) times daily.     Fluticasone-Salmeterol 100-50 MCG/DOSE Aepb  Commonly known as:  ADVAIR  Inhale 1 puff into the lungs every 12 (twelve) hours.     methocarbamol 500 MG tablet  Commonly known as:  ROBAXIN  Take 1 tablet (500 mg total) by mouth every 6 (six) hours as needed (for muscle spasm).     oxyCODONE 5 MG immediate release tablet  Commonly known as:  Oxy IR/ROXICODONE  1-2 tablets every 4-6 hrs as needed for pain        Diagnostic Studies: Dg Knee 1-2 Views Left  07/28/2013   *RADIOLOGY REPORT*  Clinical Data:  Postop left knee  LEFT KNEE - 1-2 VIEW  Comparison: None  Findings: The left knee prosthesis has been placed with  the femoral and tibial prosthetic components well seated and aligned.  The mostly radiolucent patellar component also appears well seated and aligned.  There is no acute fracture or evidence of an operative complication.  IMPRESSION: Status post left knee arthroplasty.  No evidence of an operative complication.   Original Report Authenticated By: Amie [place], M.D.    Disposition: 01-Home or Self Care      Discharge Orders   Future Appointments Provider Department Dept Phone   08/13/2013 11:00 AM [place] [facility]enn[facility] SAME DAY SURGERY [phone]   Future Orders Complete By Expires   Call MD / Call 911  As directed    Comments:     If you experience chest pain or shortness of breath, CALL 911 and be transported to the hospital emergency room.  If you develope a fever above 101 F, pus (white drainage) or increased drainage or redness at the wound, or calf pain, call your surgeon's office.   Change dressing  As directed    Comments:     Change the dressing daily with sterile 4 x 4 inch gauze dressing and apply TED hose.  You may clean the incision with alcohol prior to redressing.   Constipation Prevention  As directed    Comments:     Drink plenty of fluids.  Prune juice may be helpful.  You may use a stool softener, such as Colace (over the counter) 100 mg twice a day.  Use MiraLax (over the counter) for constipation as needed.   CPM  As directed    Comments:     Continuous passive motion machine (CPM):      Use the CPM from 0 to 90 for 6 hours per day.       You may break it up into 2 or 3 sessions per day.      Use CPM for 2 weeks or until you are told to stop.   Diet - low sodium heart healthy  As directed    Discharge instructions  As directed    Comments:     Total Knee Replacement Care After Refer to this sheet in the next few weeks. These discharge instructions provide you with general information on caring for yourself after you leave the hospital. Your  caregiver may also give you specific instructions. Your treatment has been planned according to the most current medical practices available, but  unavoidable complications sometimes occur. If you have any problems or questions after discharge, please call your caregiver. Regaining a near full range of motion of your knee within the first 3 to 6 weeks after surgery is critical. HOME CARE INSTRUCTIONS  You may resume a normal diet and activities as directed.  Perform exercises as directed.  You will receive physical therapy daily  Take showers instead of baths until informed otherwise.  Change bandages (dressings)daily Do not take over-the-counter or prescription medicines for pain, discomfort, or fever. Eat a well-balanced diet.  Avoid lifting or driving until you are instructed otherwise.  Make an appointment to see your caregiver for stitches (suture) or staple removal as directed.  If you have been sent home with a continuous passive motion machine (CPM machine), 0-90 degrees 6 hrs a day   2 hrs a shift SEEK MEDICAL CARE IF: You have swelling of your calf or leg.  You develop shortness of breath or chest pain.  You have redness, swelling, or increasing pain in the wound.  There is pus or any unusual drainage coming from the surgical site.  You notice a bad smell coming from the surgical site or dressing.  The surgical site breaks open after sutures or staples have been removed.  There is persistent bleeding from the suture or staple line.  You are getting worse or are not improving.  You have any other questions or concerns.  SEEK IMMEDIATE MEDICAL CARE IF:  You have a fever.  You develop a rash.  You have difficulty breathing.  You develop any reaction or side effects to medicines given.  Your knee motion is decreasing rather than improving.  MAKE SURE YOU:  Understand these instructions.  Will watch your condition.  Will get help right away if you are not doing well or get worse.    Do not put a pillow under the knee. Place it under the heel.  As directed    Increase activity slowly as tolerated  As directed    TED hose  As directed    Comments:     Use stockings (TED hose) for 2 weeks on both leg(s).  You may remove them at night for sleeping.      Follow-up Information   Call Loreta Ave, MD. (call today to make an appt for 2 weeks after surgery)    Specialty:  Orthopedic Surgery   Contact information:   [address] [place] [address] [phone]        Signed: Pascal Lux 07/16/2013, 9:21 PM

## 2013-07-16 NOTE — Op Note (Signed)
NAME:  Samuel Richmond, BOFFA NO.:  [id number]  MEDICAL RECORD NO.:  [id number]  LOCATION:  5N05C                        FACILITY:  [facility]  PHYSICIAN:  Loreta Ave, M.D. DATE OF BIRTH:  05-27-1956  DATE OF PROCEDURE:  07/16/2013 DATE OF DISCHARGE:                              OPERATIVE REPORT   PREOPERATIVE DIAGNOSIS:  End-stage degenerative arthritis left knee with varus alignment.  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  PROCEDURE:  Modified minimally invasive left total knee replacement, Stryker triathlon prosthesis.  Soft tissue balancing.  Computer-assisted with the OrthoSensor device.  Cemented pegged posterior stabilized #5 femoral component.  Cemented #6 tibial component, 9 mm polyethylene insert.  Cemented resurfacing 35 mm patellar component.  SURGEON:  Loreta Ave, M.D.  ASSISTANT:  Margarita Rana, MD, present throughout the entire case and necessary for timely completion of procedure.  ANESTHESIA:  General.  BLOOD LOSS:  Minimal.  SPECIMENS:  None.  CULTURES:  None.  COMPLICATIONS:  None.  DRESSINGS:  Soft compressive knee immobilizer.  DRAINS:  Hemovac x1.  TOURNIQUET TIME:  1 hour.  PROCEDURE:  The patient was brought to the operating room, placed on the operating table in supine position.  After adequate anesthesia had been obtained, tourniquet applied.  Prepped and draped in usual sterile fashion.  Exsanguinated with elevation of Esmarch.  Tourniquet inflated to 350 mmHg.  Straight incision above the patella down to the tibial tubercle.  Hemostasis with cautery.  Medial arthrotomy, vastus splitting, preserving quad tendon.  Grade 4 changes throughout.  A little tight in extension and a little bit of varus, although the varus contracture on his bed.  I did do a medial capsule release.  Remnants of menisci, cruciate ligaments, periarticular spurs removed. Intramedullary guide  distal femur.  A 8 mm resection and 5 degrees of valgus.  Using epicondylar axis, the femur was sized, cut, and fitted for a pegged posterior stabilized #5 component.  Proximal tibial resection of extramedullary guides, a 3-degree posterior slope cut below the defect medially.  Sized to a #6 component.  Debris cleared throughout.  Patella exposed.  Posterior 10 mm removed.  Drilled, sized, and fitted for a 35 mm patella.  Trials put in place with the OrthoSensor device.  This revealed very tight in extension, but in flexion was not tight, but in both flexion and extension, it was tighter on the medial side.  I felt this was a bony issue rather than a soft tissue issue.  This was revised by resecting 2 more mm off the distal femur.  I altered the tibial cut 1 degrees more varus than my initial cut.  I altered the femoral cut into 1 degree less of valgus.  The final cuts were made for the femoral component after those revisions.  Once this was complete, trials put back in place.  OrthoSensor reinserted and I had a very nicely balanced knee with full motion.  Equal pressures both compartments from a full extension to full flexion.  Rotation was set with the trials in the OrthoSensor device and the tibia was prepared with the punches.  Following this, all trials had been removed.  Copious  irrigation with pulse irrigating device.  Cement prepared, placed on all components, firmly seated.  Polyethylene attached to the tibia and knee reduced.  Patella held with a clamp.  At completion, excellent biomechanical axis.  Nice stability in flexion, extension, and nicely balanced and good patellar tracking.  The Hemovac was placed and brought out through a separate stab wound.  The soft tissues were injected with Exparel.  Arthrotomy closed with #1 Vicryl.  Skin and subcutaneous tissue with Vicryl and staples.  Sterile compressive dressing applied. Tourniquet deflated and removed.  Knee immobilizer  applied.  Anesthesia reversed.  Brought to the recovery room.  Tolerated the surgery well with no complications.     Loreta Ave, M.D.     DFM/MEDQ  D:  07/16/2013  T:  07/16/2013  Job:  [id number]

## 2013-07-16 NOTE — Progress Notes (Signed)
    Subjective:  Patient reports pain as mild  Objective:   VITALS:   Filed Vitals:   07/15/13 2207 07/16/13 0000 07/16/13 0227 07/16/13 0634  BP: 99/59  94/60 109/58  Pulse: 64  65 64  Temp: 98.4 F (36.9 C)  98 F (36.7 C) 97.8 F (36.6 C)  TempSrc: Oral  Oral Oral  Resp: 18 16 16 16   SpO2: 99%  100% 100%    Physical Exam  Dressing: C/D/I  Compartments soft  SILT DP/SP/S/S/T, 2+DP, +TA/GS/EHL  LABS  Results for orders placed during the hospital encounter of 07/15/13 (from the past 24 hour(s))  GLUCOSE, CAPILLARY     Status: Abnormal   Collection Time    07/15/13  3:30 PM      Result Value Range   Glucose-Capillary 153 (*) 70 - 99 mg/dL  CBC     Status: Abnormal   Collection Time    07/16/13  6:40 AM      Result Value Range   WBC 12.0 (*) 4.0 - 10.5 K/uL   RBC 4.10 (*) 4.22 - 5.81 MIL/uL   Hemoglobin 12.4 (*) 13.0 - 17.0 g/dL   HCT 16.1 (*) 09.6 - 04.5 %   MCV 84.6  78.0 - 100.0 fL   MCH 30.2  26.0 - 34.0 pg   MCHC 35.7  30.0 - 36.0 g/dL   RDW 40.9  81.1 - 91.4 %   Platelets 242  150 - 400 K/uL     Assessment/Plan: 1 Day Post-Op   Active Problems:   * No active hospital problems. *   PLAN: Weight Bearing: WBAT Dressings: Keep C/D/I VTE prophylaxis: ASA 325 and SCD's  Dispo: per PT and social work   [place], [place], D 07/16/2013, 9:33 AM   Margarita Rana, MD Cell [phone]

## 2013-07-16 NOTE — Progress Notes (Signed)
Physical Therapy Treatment Patient Details Name: Samuel Richmond MRN: [id number] DOB: 02-Apr-1956 Today's Date: 07/16/2013 Time: 9562-1308 PT Time Calculation (min): 27 min  PT Assessment / Plan / Recommendation  History of Present Illness s/p elective Lt Knee    PT Comments   Pt making great progress. Plans to perform stairs next session.   Follow Up Recommendations  Home health PT;Supervision - Intermittent;Supervision for mobility/OOB     Equipment Recommendations  None recommended by PT    Frequency 7X/week   Progress towards PT Goals Progress towards PT goals: Progressing toward goals  Plan Current plan remains appropriate    Precautions / Restrictions Precautions Precautions: Knee Precaution Booklet Issued: Yes (comment) Precaution Comments: pt given TKA HEP protocol  Restrictions Weight Bearing Restrictions: Yes LLE Weight Bearing: Weight bearing as tolerated   Pertinent Vitals/Pain 3/10 left knee pain;    Mobility  Bed Mobility Bed Mobility: Supine to Sit;Sit to Supine Supine to Sit: 5: Supervision;HOB elevated;With rails Sit to Supine: 5: Supervision;With rail;HOB flat Details for Bed Mobility Assistance: Supervision for safety pt able to elevate LE in/out bed  Transfers Transfers: Sit to Stand;Stand to Sit Sit to Stand: 5: Supervision;With upper extremity assist;From chair/3-in-1 Stand to Sit: 5: Supervision;With upper extremity assist;To chair/3-in-1 Details for Transfer Assistance: Vcs for hand placement Ambulation/Gait Ambulation/Gait Assistance: 5: Supervision Ambulation Distance (Feet): 200 Feet Assistive device: Rolling walker Ambulation/Gait Assistance Details: Supervision for safety pt ambulating with step through gait with little need for RW.  Gait Pattern: Step-through pattern Stairs: No    Exercises Total Joint Exercises Quad Sets: AROM;Left;10 reps;Seated Heel Slides: AAROM;Left;10 reps Hip ABduction/ADduction: AROM;Left;10  reps;Seated Straight Leg Raises: Strengthening;Left;10 reps   PT Diagnosis:    PT Problem List:   PT Treatment Interventions:     PT Goals (current goals can now be found in the care plan section) Acute Rehab PT Goals Patient Stated Goal: to walk without the walker PT Goal Formulation: With patient Potential to Achieve Goals: Good  Visit Information  Last PT Received On: 07/16/13 Assistance Needed: +1 History of Present Illness: s/p elective Lt Knee     Subjective Data  Subjective: I'm more stiff this afternoon. Patient Stated Goal: to walk without the walker   Cognition  Cognition Arousal/Alertness: Awake/alert Behavior During Therapy: WFL for tasks assessed/performed Overall Cognitive Status: Within Functional Limits for tasks assessed    Balance     End of Session PT - End of Session Equipment Utilized During Treatment: Gait belt Activity Tolerance: Patient tolerated treatment well Patient left: with family/visitor present;in bed;with call bell/phone within reach Nurse Communication: Mobility status   GP     Laurenashley Viar 07/16/2013, 5:26 PM Jake Shark, PT DPT [phone]

## 2013-07-17 LAB — CBC
HCT: 32 % — ABNORMAL LOW (ref 39.0–52.0)
RDW: 13.1 % (ref 11.5–15.5)
WBC: 10 10*3/uL (ref 4.0–10.5)

## 2013-07-17 NOTE — Progress Notes (Signed)
SW received a consult for possible placement. PT  At this time is recommending home with [facility] and not SNF. CM aware. Clinical Social Worker will sign off for now as social work intervention is no longer needed. Please consult [place] again if new need arises.   Sabino Niemann, MSW [phone]

## 2013-07-17 NOTE — Progress Notes (Signed)
Physical Therapy Treatment Patient Details Name: Samuel Richmond MRN: [id number] DOB: Feb 17, 1956 Today's Date: 07/17/2013 Time: 7829-5621 PT Time Calculation (min): 25 min  PT Assessment / Plan / Recommendation  History of Present Illness s/p elective Lt Knee    PT Comments   Patient progressing well with goals set. Anticipate DC today. Patient able to complete stair training without difficulty  Follow Up Recommendations  Home health PT;Supervision - Intermittent;Supervision for mobility/OOB     Does the patient have the potential to tolerate intense rehabilitation     Barriers to Discharge        Equipment Recommendations  None recommended by PT    Recommendations for Other Services    Frequency 7X/week   Progress towards PT Goals Progress towards PT goals: Progressing toward goals  Plan Current plan remains appropriate    Precautions / Restrictions Precautions Precautions: Knee Restrictions LLE Weight Bearing: Weight bearing as tolerated   Pertinent Vitals/Pain Complaints of soreness. RN provided medication to assist with pain control     Mobility  Bed Mobility Supine to Sit: 6: Modified independent (Device/Increase time) Transfers Sit to Stand: 6: Modified independent (Device/Increase time) Stand to Sit: 6: Modified independent (Device/Increase time) Ambulation/Gait Ambulation/Gait Assistance: 5: Supervision Ambulation Distance (Feet): 250 Feet Assistive device: Rolling walker Gait Pattern: Step-through pattern Stairs: Yes Stairs Assistance: 4: Min guard Stair Management Technique: Step to pattern;Sideways;One rail Left Number of Stairs: 12    Exercises Total Joint Exercises Quad Sets: AROM;Left;10 reps;Seated Heel Slides: AAROM;Left;10 reps Hip ABduction/ADduction: AROM;Left;10 reps;Seated Straight Leg Raises: Strengthening;Left;10 reps Long Arc Quad: AROM;Left;10 reps;Seated   PT Diagnosis:    PT Problem List:   PT Treatment Interventions:     PT  Goals (current goals can now be found in the care plan section)    Visit Information  Last PT Received On: 07/17/13 Assistance Needed: +1 History of Present Illness: s/p elective Lt Knee     Subjective Data      Cognition  Cognition Arousal/Alertness: Awake/alert Behavior During Therapy: WFL for tasks assessed/performed Overall Cognitive Status: Within Functional Limits for tasks assessed    Balance     End of Session PT - End of Session Equipment Utilized During Treatment: Gait belt Activity Tolerance: Patient tolerated treatment well Patient left: with call bell/phone within reach;in chair Nurse Communication: Mobility status   GP     Fredrich Birks 07/17/2013, 8:28 AM 07/17/2013 Fredrich Birks PTA [phone] pager [phone] office

## 2013-08-13 ENCOUNTER — Other Ambulatory Visit (HOSPITAL_COMMUNITY): Payer: Managed Care, Other (non HMO)

## 2013-09-16 ENCOUNTER — Other Ambulatory Visit (HOSPITAL_COMMUNITY): Payer: Managed Care, Other (non HMO)

## 2013-09-18 ENCOUNTER — Ambulatory Visit (INDEPENDENT_AMBULATORY_CARE_PROVIDER_SITE_OTHER): Payer: Managed Care, Other (non HMO) | Admitting: Neurology

## 2013-09-18 ENCOUNTER — Encounter: Payer: Self-pay | Admitting: Neurology

## 2013-09-18 VITALS — BP 120/76 | HR 60 | Resp 16 | Ht 74.0 in | Wt 174.0 lb

## 2013-09-18 DIAGNOSIS — F341 Dysthymic disorder: Secondary | ICD-10-CM

## 2013-09-18 DIAGNOSIS — F418 Other specified anxiety disorders: Secondary | ICD-10-CM | POA: Insufficient documentation

## 2013-09-18 DIAGNOSIS — F489 Nonpsychotic mental disorder, unspecified: Secondary | ICD-10-CM

## 2013-09-18 NOTE — Progress Notes (Signed)
[facility]  SLEEP MEDICINE CLINIC  Provider:  Melvyn Novas, M D  Referring Provider: Catha Gosselin, MD Primary Care Physician:  Mickie Hillier, MD  HPI:  Samuel Richmond is a 57 y.o. male  Is seen here as a referral/ revisit  from Dr. Clarene Duke for a sleep consultation.  Dear Dr. Clarene Duke,  Thank  you for referring patient to a sleep medicine  Consultation. Your patient, a 57 year old left-handed Caucasian, married gentleman presents today with a history of chronic insomnia and fatigue. The patient states that he had had significant impairment by depression symptoms for at least a decade perhaps all his life. He is more concerned about having a" sleep disturbance" but is not classically describing symptoms of apnea.  His wife has witnessed snoring but also has not told him about apnea.He goes to bed at 10:30  For many years, falls asleep spontaneously within less than 30 minutes, he has multiple busing breaks at night but he feels that nocturia is not necessarily waking him but he uses the opportunity when already spontaneously arouse. In the morning he rises around 6 AM. But he wakes up spontaneously during the night he uses an alarm in the morning.  He never feels refreshed or restored in the morning.  He cannot recall at the dreaming in the morning, but he also doesn't describe any nightmares or nocturnal vision, dream intrusions hallucinations or sleep paralysis.   He sometimes feels a tension in has had that he would not call and headache. He does not have any other physical signs or pains of discomfort but cognitively he feels not functioning in the morning and often seems to the 80s or doors off of one at the breakfast table. 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 He drinks only in AM one cup of coffee. He exercises ( biking ) outdoors.  He feels that he is procrastinator, not always organized, a daydreamer. He carries a  diagnosis of adult residual ADD but not ADHD. He was not a sleepy child but may have been more of an "acting out" character in his childhood , he does recall impulsivity.  The patient states that he has an aspect. Throughout his life, for example he does not recall the birth of his children should be an emotionally important event for him. His recall for names is poor he has problems with directions having lost. Please note that the patient has a history of GERD  and was diagnosed with an COPD , in  a moderate form by Dr. Sherene Sires. He also had once  a bronchoscopy related pneumothorax injury in 2006 /07. Undiagnosed respiratory problems , which  did not respond to inhalers.        Review of Systems: Out of a complete 14 system review, the patient complains of only the following symptoms, and all other reviewed systems are negative.  Epworth  # 3, Fss 40 , GDS is high at 15 , more a disthymia type.   History   Social History  . Marital Status: Married    Spouse Name: N/A    Number of Children: 1  . Years of Education: N/A   Occupational History  .  [employer]   Social History Main Topics  . Smoking status: Former Smoker -- 1.00 packs/day for 20 years    Quit date: 12/03/1998  . Smokeless tobacco: Never Used  . Alcohol Use: No  . Drug Use: No  .  Sexual Activity: Not on file   Other Topics Concern  . Not on file   Social History Narrative   Pt lives at home with wife 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.Pt has had some college    Family History  Problem Relation Age of Onset  . Prostate cancer Father     Past Medical History  Diagnosis Date  . Asthma   . Attention deficit hyperactivity disorder (ADHD)   . Dyslipidemia   . Esophageal reflux   . Elevated PSA 11/04/2012    5.33  . Prostate cancer 02/10/13    gleason 6 (3+3) and 7 (4+3) on right side, volume 28.87 cc  . COPD (chronic obstructive pulmonary disease)   . Arthritis   . Benign prostatic hypertrophy      Past Surgical History  Procedure Laterality Date  . Trigger finger release  09/04/2012    Procedure: MINOR RELEASE TRIGGER FINGER/A-1 PULLEY;  Surgeon: Wyn Forster., MD;  Location: [facility];  Service: Orthopedics;  Laterality: Left;  left long and cyst excision left long  . Hernia repair    . Knee surgery      x 5  . Nasal sinus surgery    . Robot assisted laparoscopic radical prostatectomy Bilateral 03/26/2013    Procedure: ROBOTIC ASSISTED LAPAROSCOPIC RADICAL PROSTATECTOMY LEVEL 2;  Surgeon: Crecencio Mc, MD;  Location: [facility];  Service: Urology;  Laterality: Bilateral;  3 HRS   . Lymphadenectomy Bilateral 03/26/2013    Procedure: LYMPHADENECTOMY;  Surgeon: Crecencio Mc, MD;  Location: [facility];  Service: Urology;  Laterality: Bilateral;  . Total knee arthroplasty Left 07/15/2013    Dr Eulah Pont  . Total knee arthroplasty Left 07/15/2013    Procedure: TOTAL KNEE ARTHROPLASTY;  Surgeon: Loreta Ave, MD;  Location: [facility] OR;  Service: Orthopedics;  Laterality: Left;    Current Outpatient Prescriptions  Medication Sig Dispense Refill  . Calcium Carbonate-Vitamin D (CALCIUM 600+D) 600-200 MG-UNIT TABS Take 1 tablet by mouth 2 (two) times daily.      [name] escitalopram (LEXAPRO) 10 MG tablet Take 10 mg by mouth every morning.       . Fluticasone-Salmeterol (ADVAIR) 100-50 MCG/DOSE AEPB Inhale 1 puff into the lungs every 12 (twelve) hours.      . Omega-3 Fatty Acids (FISH OIL) 1000 MG CAPS Take 1 capsule by mouth 2 (two) times daily.      [name] aspirin 325 MG tablet 1 tablet daily for 1 month to prevent blood clots  30 tablet  0  . bisacodyl (DULCOLAX) 5 MG EC tablet Take 2 tablets every night with dinner until bowel movement.  LAXITIVE.  Restart if two days since last bowel movement  30 tablet  0  . celecoxib (CELEBREX) 200 MG capsule Take 1 capsule (200 mg total) by mouth daily.  30 capsule  0  . CIALIS 5 MG tablet       . docusate sodium 100 MG CAPS 1 tab 2 times a day while on  narcotics.  STOOL SOFTENER  60 capsule  2  . HYDROcodone-acetaminophen (NORCO) 7.5-325 MG per tablet       . methocarbamol (ROBAXIN) 500 MG tablet Take 1 tablet (500 mg total) by mouth every 6 (six) hours as needed (for muscle spasm).  60 tablet  0  . oxyCODONE (OXY IR/ROXICODONE) 5 MG immediate release tablet 1-2 tablets every 4-6 hrs as needed for pain  100 tablet  0  . zolpidem (AMBIEN) 10  MG tablet        No current facility-administered medications for this visit.    Allergies as of 09/18/2013 - Review Complete 09/18/2013  Allergen Reaction Noted  . Levaquin [levofloxacin] Other (See Comments) 03/10/2013    Vitals: BP 120/76  Pulse 60  Resp 16  Ht 6\' 2"  (1.88 m)  Wt 174 lb (78.926 kg)  BMI 22.33 kg/m2 Last Weight:  Wt Readings from Last 1 Encounters:  09/18/13 174 lb (78.926 kg)   Last Height:   Ht Readings from Last 1 Encounters:  09/18/13 6\' 2"  (1.88 m)   Physical exam:  General: The patient is awake, alert and appears not in acute distress. The patient is well groomed. Head: Normocephalic, atraumatic. Neck is supple. Mallampati 2 , neck circumference: 14 inches.  Cardiovascular:  Regular rate and rhythm , without  murmurs or carotid bruit, and without distended neck veins. Respiratory: Lungs are clear to auscultation. Skin:  Without evidence of edema, or rash Trunk: BMI is  normal .  Neurologic exam : The patient is awake and alert, oriented to place and time.  Memory subjective described as amnestic impaired  There is a normal attention span & concentration ability.  Speech is fluent without   dysarthria, dysphonia or aphasia. He sometimes use inappropriate words.  Mood and affect are dysthymic   Cranial nerves: Pupils are equal and briskly reactive to light. Funduscopic exam without evidence of pallor or edema. Extraocular movements  in vertical and horizontal planes intact and without nystagmus. Visual fields by finger perimetry are intact. Hearing to finger rub  intact.  Facial sensation intact to fine touch. Facial motor strength is symmetric and tongue and uvula move midline.  Motor exam:    Normal tone and normal muscle bulk and symmetric normal strength in all extremities.  Sensory:  Fine touch, pinprick and vibration were tested in all extremities. Proprioception is normal.  Coordination: Rapid alternating movements in the fingers/hands is tested and normal. Finger-to-nose maneuver tested and normal without evidence of ataxia, dysmetria or tremor.  Gait and station: Patient walks without assistive device. Strength within normal limits. Stance is stable and normal. Tandem gait is unfragmented. Romberg testing is normal.  Deep tendon reflexes: in the  upper and lower extremities are symmetric and intact. Babinski maneuver response is  downgoing.   Assessment:  After physical and neurologic examination, review of laboratory studies, imaging, neurophysiology testing and pre-existing records,  assessment is that of a patient with psychological , non organic insomnia.  His problem is not sleepiness, but fatigue and latent depression, poor energy.  Early arousals from sleep, sleep maintenance insomnia- serotonin ( SSRI )  was already prescribed , he may benefit to take it at PM . .   He has a daughter with sleep walking history.   Plan:  Treatment plan ; I recommended to establish a sleep Focussed psychiatric liaison,  He may have a seasonal affective component as well. Perhaps a Light box can help to get him started in the morning.  He certainly will not need a sleep aid , but should continue melatonin 10 mg 30 minutes before bedtime.

## 2013-09-18 NOTE — Patient Instructions (Signed)

## 2013-09-25 ENCOUNTER — Encounter: Payer: Self-pay | Admitting: Neurology

## 2013-09-30 ENCOUNTER — Inpatient Hospital Stay (HOSPITAL_COMMUNITY)
Admission: RE | Admit: 2013-09-30 | Payer: Managed Care, Other (non HMO) | Source: Ambulatory Visit | Admitting: Orthopedic Surgery

## 2013-10-08 ENCOUNTER — Other Ambulatory Visit: Payer: Self-pay

## 2013-10-13 ENCOUNTER — Other Ambulatory Visit (HOSPITAL_COMMUNITY): Payer: Managed Care, Other (non HMO)

## 2013-10-21 ENCOUNTER — Encounter (HOSPITAL_COMMUNITY): Admission: RE | Payer: Self-pay | Source: Ambulatory Visit

## 2013-10-21 SURGERY — ARTHROPLASTY, KNEE, TOTAL
Anesthesia: General | Laterality: Right

## 2014-09-01 ENCOUNTER — Ambulatory Visit (INDEPENDENT_AMBULATORY_CARE_PROVIDER_SITE_OTHER): Payer: BC Managed Care – PPO | Admitting: Family Medicine

## 2014-09-01 ENCOUNTER — Encounter: Payer: Self-pay | Admitting: Family Medicine

## 2014-09-01 VITALS — BP 110/78 | HR 76 | Temp 98.4°F | Resp 16 | Ht 71.5 in | Wt 183.4 lb

## 2014-09-01 DIAGNOSIS — F418 Other specified anxiety disorders: Secondary | ICD-10-CM

## 2014-09-01 DIAGNOSIS — K219 Gastro-esophageal reflux disease without esophagitis: Secondary | ICD-10-CM

## 2014-09-01 DIAGNOSIS — F341 Dysthymic disorder: Secondary | ICD-10-CM

## 2014-09-01 DIAGNOSIS — J4520 Mild intermittent asthma, uncomplicated: Secondary | ICD-10-CM

## 2014-09-01 DIAGNOSIS — Z2911 Encounter for prophylactic immunotherapy for respiratory syncytial virus (RSV): Secondary | ICD-10-CM

## 2014-09-01 DIAGNOSIS — Z23 Encounter for immunization: Secondary | ICD-10-CM

## 2014-09-01 DIAGNOSIS — C61 Malignant neoplasm of prostate: Secondary | ICD-10-CM

## 2014-09-01 DIAGNOSIS — J45909 Unspecified asthma, uncomplicated: Secondary | ICD-10-CM

## 2014-09-01 MED ORDER — OMEPRAZOLE 20 MG PO CPDR: 20.0000 mg | DELAYED_RELEASE_CAPSULE | Freq: Every day | ORAL | Status: DC

## 2014-09-01 MED ORDER — BUPROPION HCL ER (XL) 150 MG PO TB24: 150.0000 mg | ORAL_TABLET | Freq: Every day | ORAL | Status: DC

## 2014-09-01 MED ORDER — ESCITALOPRAM OXALATE 20 MG PO TABS: 20.0000 mg | ORAL_TABLET | Freq: Every day | ORAL | Status: DC

## 2014-09-01 NOTE — Patient Instructions (Signed)
Schedule your complete physical in 6 months Start the Wellbutrin in addition to the Lexapro daily- if problems, please call Start the Omeprazole daily for reflux Call with any questions or concerns Welcome!  We're glad to have you!!

## 2014-09-01 NOTE — Assessment & Plan Note (Signed)
New to provider, ongoing for pt.  Not well controlled on Lexapro 20mg .  Will add Wellbutrin for better symptom control.  Pt expressed understanding and is in agreement w/ plan.

## 2014-09-01 NOTE — Progress Notes (Signed)
   Subjective:    Patient ID: Samuel Richmond, male    DOB: 06-05-1956, 58 y.o.   MRN: [id number]  HPI New to establish.  Previous MD- Little.  Neuro- Dohmeier, no longer following  UroAlinda Money  Prostate Cancer- pt w/ hx of previous, following w/ Dr Alinda Money.  S/p prostatectomy 03/2013.  Pt reports PSA has risen as high as 0.1 s/p surgery and is having q3 month PSA checks.    Depression- chronic problem, currently on Lexapro w/ adequate but not good symptom control.  Pt has taken multiple other meds previously.  Taking 20mg  daily x1 yr.  COPD- pt reports sxs started ~2000.  Pt had sensation of hyperinflation.  Has had evaluations at [facility] w/ CT scans, bronchoscopy.  Has been told by different docs asthma vs COPD.  Pt reports if he takes 'too much Advair- twice a day' it 'becomes a negative thing'.  Pt reports he will have typically 1 flare yearly, occurs in fall or winter.  Will last 30 days.  When it occurs will take Advair, PPI and 'wait it out'.  No relief w/ abx.  GERD- pt reports he has intermittent occurences that usually occur for 30 days.  Will take OTC PPI PRN.  Pt has been told that he has mild hiatal hernia.   Review of Systems For ROS see HPI     Objective:   Physical Exam  Vitals reviewed. Constitutional: He is oriented to person, place, and time. He appears well-developed and well-nourished. No distress.  HENT:  Head: Normocephalic and atraumatic.  Eyes: Conjunctivae and EOM are normal. Pupils are equal, round, and reactive to light.  Neck: Normal range of motion. Neck supple. No thyromegaly present.  Cardiovascular: Normal rate, regular rhythm, normal heart sounds and intact distal pulses.   No murmur heard. Pulmonary/Chest: Effort normal and breath sounds normal. No respiratory distress.  Abdominal: Soft. Bowel sounds are normal. He exhibits no distension.  Musculoskeletal: He exhibits no edema.  Lymphadenopathy:    He has no cervical adenopathy.  Neurological: He is  alert and oriented to person, place, and time. No cranial nerve deficit.  Skin: Skin is warm and dry.  Psychiatric: He has a normal mood and affect. His behavior is normal.          Assessment & Plan:

## 2014-09-01 NOTE — Assessment & Plan Note (Signed)
New.  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  Will follow.

## 2014-09-01 NOTE — Assessment & Plan Note (Signed)
New to provider, ongoing for pt.  Suspect that pt's 'lung flares' are reflux induced bronchospasm w/ hiatal hernia.  Start daily PPI.  Reviewed lifestyle and dietary modifications.  Pt expressed understanding and is in agreement w/ plan.

## 2014-09-01 NOTE — Progress Notes (Signed)
Pre visit review using our clinic review tool, if applicable. No additional management support is needed unless otherwise documented below in the visit note. 

## 2014-09-01 NOTE — Assessment & Plan Note (Signed)
New to provider, ongoing for pt.  Following w/ Dr Alinda Money.  Will follow along and assist as able.

## 2014-09-09 ENCOUNTER — Encounter: Payer: Self-pay | Admitting: Family Medicine

## 2014-09-09 MED ORDER — ESCITALOPRAM OXALATE 20 MG PO TABS: 20.0000 mg | ORAL_TABLET | Freq: Every day | ORAL | Status: DC

## 2014-09-09 NOTE — Telephone Encounter (Signed)
Med filled.  

## 2014-09-15 ENCOUNTER — Other Ambulatory Visit: Payer: Self-pay | Admitting: General Practice

## 2014-09-15 MED ORDER — ESCITALOPRAM OXALATE 20 MG PO TABS: 20.0000 mg | ORAL_TABLET | Freq: Every day | ORAL | Status: DC

## 2014-09-23 ENCOUNTER — Telehealth: Payer: Self-pay | Admitting: Family Medicine

## 2014-09-23 MED ORDER — PREDNISONE 10 MG PO TABS: ORAL_TABLET | ORAL | Status: DC

## 2014-09-23 NOTE — Telephone Encounter (Signed)
Pt states that during last OV on 09/01/14 pt and Dr. Birdie Riddle discussed reactive airway disease.  He states that the PPI that was prescribed is not relieving pt's symptoms and he would like a prescription for a tapered dose of prednisone to be called into the pharmacy.    Please advise.

## 2014-09-23 NOTE — Telephone Encounter (Signed)
[facility] for Prednisone 10mg - 3 for 3 days, 2 for 3 days, 1 for 3 days, #18.  Take w/ food.  If no improvement after prednisone, will need OV

## 2014-09-23 NOTE — Telephone Encounter (Signed)
Caller name: Shakim   Call back number: [phone]   Reason for call: Pt requesting to speak with Jess about med. Please advise

## 2014-09-23 NOTE — Telephone Encounter (Signed)
Med filled and pt notified.  

## 2014-09-23 NOTE — Telephone Encounter (Signed)
Left message for call back.

## 2014-11-08 ENCOUNTER — Encounter: Payer: Self-pay | Admitting: Radiation Oncology

## 2014-11-08 NOTE — Progress Notes (Signed)
GU Location of Tumor / Histology: prostate adenocarcinoma  If Prostate Cancer, Gleason Score is (4 + 3) and PSA is (0.09 on 07/09/14)  Samuel Richmond presented 01/2013 with signs/symptoms of: elevated PSA  Biopsies of  (if applicable) revealed:  2/58/52 Diagnosis 1. Prostate, radical resection - PROSTATIC ADENOCARCINOMA, GLEASON'S SCORE 4+3=7 INVOLVING BOTH LOBES. FOCAL EXTRAPROSTATIC EXTENSION PRESENT. - NO EVIDENCE OF SEMINAL VESICLE INVOLVEMENT OR ANGIOLYMPHATIC INVASION IDENTIFIED. - ALL THE RESECTION MARGINS, NEGATIVE FOR ATYPIA OR MALIGNANCY. 2. Lymph nodes, regional resection, right pelvic - FIVE LYMPH NODES, NEGATIVE FOR METASTATIC CARCINOMA (0/5). 3. Lymph nodes, regional resection, left pelvic - FOUR LYMPH NODES, NEGATIVE FOR METASTATIC CARCINOMA (0/4).  Past/Anticipated interventions by urology, if any: 03/26/13 prostatectomy following positive prostate biopsy in 01/2013  Past/Anticipated interventions by medical oncology, if any: no  Weight changes, if any: no  Bowel/Bladder complaints, if any:  IPSS 2, nocturia x 2  Nausea/Vomiting, if any: no  Pain issues, if any:  no  SAFETY ISSUES:  Prior radiation? no  Pacemaker/ICD? no  Possible current pregnancy? na  Is the patient on methotrexate? no  Current Complaints / other details:  Married Rising PSA s/p prostatectomy, bilateral pelvic lymphadenectomy on  03/2013

## 2014-11-09 ENCOUNTER — Ambulatory Visit
Admission: RE | Admit: 2014-11-09 | Discharge: 2014-11-09 | Disposition: A | Discharge status: Home / Self Care | Payer: BLUE CROSS/BLUE SHIELD | Source: Ambulatory Visit | Attending: Radiation Oncology | Admitting: Radiation Oncology

## 2014-11-09 ENCOUNTER — Encounter: Payer: Self-pay | Admitting: Radiation Oncology

## 2014-11-09 VITALS — BP 129/74 | HR 66 | Temp 98.1°F | Resp 20 | Wt 185.2 lb

## 2014-11-09 DIAGNOSIS — N529 Male erectile dysfunction, unspecified: Secondary | ICD-10-CM | POA: Diagnosis not present

## 2014-11-09 DIAGNOSIS — C61 Malignant neoplasm of prostate: Secondary | ICD-10-CM | POA: Diagnosis not present

## 2014-11-09 DIAGNOSIS — Z9079 Acquired absence of other genital organ(s): Secondary | ICD-10-CM | POA: Insufficient documentation

## 2014-11-09 DIAGNOSIS — N393 Stress incontinence (female) (male): Secondary | ICD-10-CM | POA: Diagnosis not present

## 2014-11-09 DIAGNOSIS — Z510 Encounter for antineoplastic radiation therapy: Secondary | ICD-10-CM | POA: Insufficient documentation

## 2014-11-09 NOTE — Progress Notes (Signed)
Please see the Nurse Progress Note in the MD Initial Consult Encounter for this patient. 

## 2014-11-09 NOTE — Progress Notes (Addendum)
CC: Dr. Annye Asa, Dr. Raynelle Bring   Follow-up note:  Diagnosis: PSA recurrent carcinoma the prostate, pathologic stage pT3 adenocarcinoma prostate  History: The patient returns today for review and consideration of salvage radiation therapy in the management of his PSA recurrent carcinoma the prostate.  I first saw the patient in consultation on 02/26/2013.  He was referred by Dr. Gaynelle Arabian for evaluation of stage TIc intermediate risk adenocarcinoma prostate.  He presented with a PSA of 5.33 in December 2013. He was seen by Dr. Gaynelle Arabian who performed ultrasound-guided biopsies on 02/10/2013. His was found to have Gleason 7 (4+3) involving 20% of one core from the right mid gland and Gleason 6 (3+3) involving less than 5% of one core from right lateral apex and 5% of one core from the right apex. His gland volume was approximately 29 cc. he was seen by Dr. Alinda Money who offered him a robotic prostatectomy which she underwent on 03/26/2013.  He was found to have Gleason 7 (4+3) involving both lobes with focal extraprostatic extension.  Surgical margins were negative and seminal vesicles were uninvolved.  5 lymph nodes from the left and 4 from the right were  free of metastatic disease.  Less than 8% of the prostate tissue contained carcinoma.  Postoperatively, his PSA became undetectable.  His PSA in October 2014 was 0.03 rising to 0.08 by 03/23/2014.  He tells me that his most recent PSA was 0.17.  I do not have Dr. Lynne Logan most recent office note.  He is doing well from a GU and GI standpoint.  He has rare stress incontinence.  He have mild erectile dysfunction which is much improved with sildenafil.  He made an appointment to see me today to discuss salvage radiation therapy.  Physical examination: Alert and oriented. Filed Vitals:   11/09/14 1237  BP: 129/74  Pulse: 66  Temp: 98.1 F (36.7 C)  Resp: 20   Rectal examination: Prostate bed is flat and is without masses or  nodularity.  Laboratory data: Recent PSA 0.17 according to the patient.  Impression: PSA recurrent carcinoma the prostate.  We discussed clinical factors which may predict for increased risk for a local recurrence, and thus possible salvage with radiation therapy.  Pertinent clinical factors include pathologic stage T3a disease, disease-free interval and initial presentation of a Gleason score less than or equal to 7 and a PSA less than 10.  He does not have the most important prognostic factor which would be a positive surgical margin.  We also discussed the timing of salvage radiation therapy which should be done before his PSA reaches 0.5.  There are experts who feel that the lower the PSA, the more favorable prognosis.  The patient is anxious to get started after the holidays.  I discussed the potential acute and late toxicities of radiation therapy.  We discussed bladder filling to minimize urinary toxicity.  Consent is signed today.  Plan: As discussed above.  He will return here for CT simulation in early January 2016.  50 minutes was spent face-to-face with the patient, primarily counseling patient and coordinating his care.

## 2014-11-30 ENCOUNTER — Other Ambulatory Visit: Payer: Self-pay | Admitting: Orthopedic Surgery

## 2014-12-02 ENCOUNTER — Encounter (HOSPITAL_BASED_OUTPATIENT_CLINIC_OR_DEPARTMENT_OTHER): Payer: Self-pay | Admitting: *Deleted

## 2014-12-06 ENCOUNTER — Ambulatory Visit
Admission: RE | Admit: 2014-12-06 | Discharge: 2014-12-06 | Disposition: A | Discharge status: Home / Self Care | Payer: BLUE CROSS/BLUE SHIELD | Source: Ambulatory Visit | Attending: Radiation Oncology | Admitting: Radiation Oncology

## 2014-12-06 DIAGNOSIS — Z510 Encounter for antineoplastic radiation therapy: Secondary | ICD-10-CM | POA: Diagnosis not present

## 2014-12-06 DIAGNOSIS — C61 Malignant neoplasm of prostate: Secondary | ICD-10-CM

## 2014-12-06 NOTE — Progress Notes (Signed)
Complex simulation/treatment planning note: The patient was taken to the CT simulator.  He was placed supine.  A Vac lock immobilization device was constructed.  A red rubber tube was placed within the rectal vault.  He was then catheterized and contrast instilled into his bladder/urethra.  The CT data set was sent to the MIM planning system where contoured his high risk prostate bed, CTV 66 and low risk prostate bed CTV 54.45.  These will be expanded by 0.5 cm to create respective planning target volumes.  I prescribing 6600 cGy in 33 sessions to his high-risk prostate PTV and 5000 445 cGy in 33 sessions to his low risk prostate PTV.  He is now ready for IMRT simulation/treatment planning.

## 2014-12-07 ENCOUNTER — Encounter: Payer: Self-pay | Admitting: Radiation Oncology

## 2014-12-07 DIAGNOSIS — Z510 Encounter for antineoplastic radiation therapy: Secondary | ICD-10-CM | POA: Diagnosis not present

## 2014-12-07 NOTE — Progress Notes (Signed)
IMRT simulation/treatment planning note: The patient completed his dual ARC VMAT IMRT planning in the management of his carcinoma the prostate.  IMRT was chosen to decrease the risk for both acute and late bladder and rectal toxicity compared to 3-D conformal or conventional radiation therapy.  Dose volume histograms were obtained for the target structure including prostate tumor bed in addition to avoidance structures including the bladder, rectum, and femoral heads.  We met our departmental guidelines.  I prescribing 6600 cGy in 33 sessions utilizing 6 MV photons, dual ARC VMAT IMRT.

## 2014-12-08 DIAGNOSIS — Z510 Encounter for antineoplastic radiation therapy: Secondary | ICD-10-CM | POA: Diagnosis not present

## 2014-12-09 ENCOUNTER — Ambulatory Visit (HOSPITAL_BASED_OUTPATIENT_CLINIC_OR_DEPARTMENT_OTHER): Admit: 2014-12-09 | Payer: Self-pay | Admitting: Orthopedic Surgery

## 2014-12-09 ENCOUNTER — Encounter (HOSPITAL_BASED_OUTPATIENT_CLINIC_OR_DEPARTMENT_OTHER): Payer: Self-pay

## 2014-12-09 ENCOUNTER — Ambulatory Visit (HOSPITAL_BASED_OUTPATIENT_CLINIC_OR_DEPARTMENT_OTHER): Payer: BLUE CROSS/BLUE SHIELD | Admitting: Anesthesiology

## 2014-12-09 ENCOUNTER — Ambulatory Visit (HOSPITAL_BASED_OUTPATIENT_CLINIC_OR_DEPARTMENT_OTHER)
Admission: RE | Admit: 2014-12-09 | Discharge: 2014-12-09 | Disposition: A | Discharge status: Home / Self Care | Payer: BLUE CROSS/BLUE SHIELD | Source: Ambulatory Visit | Attending: Orthopedic Surgery | Admitting: Orthopedic Surgery

## 2014-12-09 ENCOUNTER — Encounter (HOSPITAL_BASED_OUTPATIENT_CLINIC_OR_DEPARTMENT_OTHER)
Admission: RE | Disposition: A | Discharge status: Home / Self Care | Payer: Self-pay | Source: Ambulatory Visit | Attending: Orthopedic Surgery

## 2014-12-09 ENCOUNTER — Encounter (HOSPITAL_BASED_OUTPATIENT_CLINIC_OR_DEPARTMENT_OTHER): Payer: Self-pay | Admitting: Anesthesiology

## 2014-12-09 DIAGNOSIS — M65842 Other synovitis and tenosynovitis, left hand: Secondary | ICD-10-CM | POA: Diagnosis present

## 2014-12-09 DIAGNOSIS — F329 Major depressive disorder, single episode, unspecified: Secondary | ICD-10-CM | POA: Insufficient documentation

## 2014-12-09 DIAGNOSIS — K219 Gastro-esophageal reflux disease without esophagitis: Secondary | ICD-10-CM | POA: Insufficient documentation

## 2014-12-09 DIAGNOSIS — F909 Attention-deficit hyperactivity disorder, unspecified type: Secondary | ICD-10-CM | POA: Insufficient documentation

## 2014-12-09 DIAGNOSIS — Z79899 Other long term (current) drug therapy: Secondary | ICD-10-CM | POA: Diagnosis not present

## 2014-12-09 DIAGNOSIS — G4700 Insomnia, unspecified: Secondary | ICD-10-CM | POA: Insufficient documentation

## 2014-12-09 DIAGNOSIS — M1990 Unspecified osteoarthritis, unspecified site: Secondary | ICD-10-CM | POA: Diagnosis not present

## 2014-12-09 DIAGNOSIS — M65841 Other synovitis and tenosynovitis, right hand: Secondary | ICD-10-CM | POA: Diagnosis present

## 2014-12-09 DIAGNOSIS — Z87891 Personal history of nicotine dependence: Secondary | ICD-10-CM | POA: Insufficient documentation

## 2014-12-09 DIAGNOSIS — E785 Hyperlipidemia, unspecified: Secondary | ICD-10-CM | POA: Diagnosis not present

## 2014-12-09 DIAGNOSIS — J45909 Unspecified asthma, uncomplicated: Secondary | ICD-10-CM | POA: Insufficient documentation

## 2014-12-09 DIAGNOSIS — J449 Chronic obstructive pulmonary disease, unspecified: Secondary | ICD-10-CM | POA: Insufficient documentation

## 2014-12-09 DIAGNOSIS — N400 Enlarged prostate without lower urinary tract symptoms: Secondary | ICD-10-CM | POA: Insufficient documentation

## 2014-12-09 DIAGNOSIS — Z8546 Personal history of malignant neoplasm of prostate: Secondary | ICD-10-CM | POA: Insufficient documentation

## 2014-12-09 DIAGNOSIS — Z510 Encounter for antineoplastic radiation therapy: Secondary | ICD-10-CM | POA: Diagnosis not present

## 2014-12-09 HISTORY — DX: Anxiety disorder, unspecified: F41.9

## 2014-12-09 HISTORY — DX: Depression, unspecified: F32.A

## 2014-12-09 HISTORY — DX: Major depressive disorder, single episode, unspecified: F32.9

## 2014-12-09 HISTORY — PX: TRIGGER FINGER RELEASE: SHX641

## 2014-12-09 LAB — POCT HEMOGLOBIN-HEMACUE: Hemoglobin: 14.9 g/dL (ref 13.0–17.0)

## 2014-12-09 SURGERY — RELEASE, A1 PULLEY, FOR TRIGGER FINGER
Anesthesia: General | Site: Hand | Laterality: Bilateral

## 2014-12-09 SURGERY — RELEASE, A1 PULLEY, FOR TRIGGER FINGER
Anesthesia: Choice | Laterality: Bilateral

## 2014-12-09 MED ORDER — CHLORHEXIDINE GLUCONATE 4 % EX LIQD: 60.0000 mL | Freq: Once | CUTANEOUS | Status: DC

## 2014-12-09 MED ORDER — HYDROCODONE-ACETAMINOPHEN 5-325 MG PO TABS: 1.0000 | ORAL_TABLET | Freq: Four times a day (QID) | ORAL | Status: DC | PRN

## 2014-12-09 MED ORDER — ONDANSETRON HCL 4 MG/2ML IJ SOLN: 4.0000 mg | Freq: Once | INTRAMUSCULAR | Status: DC | PRN

## 2014-12-09 MED ORDER — MIDAZOLAM HCL 2 MG/2ML IJ SOLN
INTRAMUSCULAR | Status: AC
  Filled 2014-12-09: qty 2

## 2014-12-09 MED ORDER — GLYCOPYRROLATE 0.2 MG/ML IJ SOLN
INTRAMUSCULAR | Status: DC | PRN
  Administered 2014-12-09: 0.2 mg via INTRAVENOUS

## 2014-12-09 MED ORDER — OXYCODONE HCL 5 MG PO TABS: 5.0000 mg | ORAL_TABLET | Freq: Once | ORAL | Status: DC | PRN

## 2014-12-09 MED ORDER — HYDROMORPHONE HCL 1 MG/ML IJ SOLN: 0.2500 mg | INTRAMUSCULAR | Status: DC | PRN

## 2014-12-09 MED ORDER — MIDAZOLAM HCL 5 MG/5ML IJ SOLN
INTRAMUSCULAR | Status: DC | PRN
  Administered 2014-12-09: 2 mg via INTRAVENOUS

## 2014-12-09 MED ORDER — PROPOFOL 10 MG/ML IV BOLUS
INTRAVENOUS | Status: DC | PRN
  Administered 2014-12-09: 200 mg via INTRAVENOUS

## 2014-12-09 MED ORDER — CEFAZOLIN SODIUM-DEXTROSE 2-3 GM-% IV SOLR
2.0000 g | INTRAVENOUS | Status: AC
  Administered 2014-12-09: 2 g via INTRAVENOUS

## 2014-12-09 MED ORDER — LACTATED RINGERS IV SOLN
INTRAVENOUS | Status: DC
  Administered 2014-12-09: 12:00:00 via INTRAVENOUS

## 2014-12-09 MED ORDER — BUPIVACAINE HCL (PF) 0.25 % IJ SOLN
INTRAMUSCULAR | Status: DC | PRN
  Administered 2014-12-09: 5 mL

## 2014-12-09 MED ORDER — LIDOCAINE HCL (CARDIAC) 20 MG/ML IV SOLN
INTRAVENOUS | Status: DC | PRN
  Administered 2014-12-09: 80 mg via INTRAVENOUS

## 2014-12-09 MED ORDER — ONDANSETRON HCL 4 MG/2ML IJ SOLN
INTRAMUSCULAR | Status: DC | PRN
  Administered 2014-12-09: 4 mg via INTRAVENOUS

## 2014-12-09 MED ORDER — OXYCODONE HCL 5 MG/5ML PO SOLN: 5.0000 mg | Freq: Once | ORAL | Status: DC | PRN

## 2014-12-09 MED ORDER — CEFAZOLIN SODIUM-DEXTROSE 2-3 GM-% IV SOLR
INTRAVENOUS | Status: AC
  Filled 2014-12-09: qty 50

## 2014-12-09 MED ORDER — CEFAZOLIN SODIUM-DEXTROSE 2-3 GM-% IV SOLR: 2.0000 g | INTRAVENOUS | Status: DC

## 2014-12-09 MED ORDER — BUPIVACAINE HCL (PF) 0.25 % IJ SOLN
INTRAMUSCULAR | Status: AC
  Filled 2014-12-09: qty 150

## 2014-12-09 MED ORDER — FENTANYL CITRATE 0.05 MG/ML IJ SOLN
INTRAMUSCULAR | Status: DC | PRN
  Administered 2014-12-09: 100 ug via INTRAVENOUS

## 2014-12-09 MED ORDER — LIDOCAINE HCL 2 % IJ SOLN
INTRAMUSCULAR | Status: AC
  Filled 2014-12-09: qty 20

## 2014-12-09 MED ORDER — MIDAZOLAM HCL 2 MG/2ML IJ SOLN: 1.0000 mg | INTRAMUSCULAR | Status: DC | PRN

## 2014-12-09 MED ORDER — FENTANYL CITRATE 0.05 MG/ML IJ SOLN
INTRAMUSCULAR | Status: AC
  Filled 2014-12-09: qty 6

## 2014-12-09 MED ORDER — FENTANYL CITRATE 0.05 MG/ML IJ SOLN: 50.0000 ug | INTRAMUSCULAR | Status: DC | PRN

## 2014-12-09 SURGICAL SUPPLY — 37 items
BANDAGE COBAN STERILE 2 (GAUZE/BANDAGES/DRESSINGS) ×1 IMPLANT
BLADE SURG 15 STRL LF DISP TIS (BLADE) ×1 IMPLANT
BLADE SURG 15 STRL SS (BLADE) ×2
BNDG CMPR 9X4 STRL LF SNTH (GAUZE/BANDAGES/DRESSINGS) ×1
BNDG COHESIVE 1X5 TAN STRL LF (GAUZE/BANDAGES/DRESSINGS) ×2 IMPLANT
BNDG ESMARK 4X9 LF (GAUZE/BANDAGES/DRESSINGS) ×1 IMPLANT
CHLORAPREP W/TINT 26ML (MISCELLANEOUS) ×3 IMPLANT
CORDS BIPOLAR (ELECTRODE) IMPLANT
COVER BACK TABLE 60X90IN (DRAPES) ×2 IMPLANT
COVER MAYO STAND STRL (DRAPES) ×3 IMPLANT
CUFF TOURNIQUET SINGLE 18IN (TOURNIQUET CUFF) IMPLANT
DECANTER SPIKE VIAL GLASS SM (MISCELLANEOUS) IMPLANT
DRAPE EXTREMITY T 121X128X90 (DRAPE) ×3 IMPLANT
DRAPE SURG 17X23 STRL (DRAPES) ×3 IMPLANT
GAUZE SPONGE 4X4 12PLY STRL (GAUZE/BANDAGES/DRESSINGS) ×2 IMPLANT
GAUZE XEROFORM 1X8 LF (GAUZE/BANDAGES/DRESSINGS) ×2 IMPLANT
GLOVE BIO SURGEON STRL SZ 6.5 (GLOVE) ×1 IMPLANT
GLOVE BIOGEL PI IND STRL 7.0 (GLOVE) IMPLANT
GLOVE BIOGEL PI IND STRL 8.5 (GLOVE) ×1 IMPLANT
GLOVE BIOGEL PI INDICATOR 7.0 (GLOVE) ×2
GLOVE BIOGEL PI INDICATOR 8.5 (GLOVE) ×1
GLOVE ECLIPSE 6.5 STRL STRAW (GLOVE) ×1 IMPLANT
GLOVE SURG ORTHO 8.0 STRL STRW (GLOVE) ×2 IMPLANT
GOWN STRL REUS W/ TWL LRG LVL3 (GOWN DISPOSABLE) ×1 IMPLANT
GOWN STRL REUS W/TWL LRG LVL3 (GOWN DISPOSABLE) ×2
GOWN STRL REUS W/TWL XL LVL3 (GOWN DISPOSABLE) ×2 IMPLANT
LIQUID BAND (GAUZE/BANDAGES/DRESSINGS) ×2 IMPLANT
NDL PRECISIONGLIDE 27X1.5 (NEEDLE) ×1 IMPLANT
NEEDLE PRECISIONGLIDE 27X1.5 (NEEDLE) ×2 IMPLANT
NS IRRIG 1000ML POUR BTL (IV SOLUTION) ×2 IMPLANT
PACK BASIN DAY SURGERY FS (CUSTOM PROCEDURE TRAY) ×2 IMPLANT
STOCKINETTE 4X48 STRL (DRAPES) ×3 IMPLANT
SUT MNCRL AB 4-0 PS2 18 (SUTURE) ×1 IMPLANT
SYR BULB 3OZ (MISCELLANEOUS) ×2 IMPLANT
SYR CONTROL 10ML LL (SYRINGE) ×2 IMPLANT
TOWEL OR 17X24 6PK STRL BLUE (TOWEL DISPOSABLE) ×4 IMPLANT
UNDERPAD 30X30 INCONTINENT (UNDERPADS AND DIAPERS) ×1 IMPLANT

## 2014-12-09 NOTE — Anesthesia Preprocedure Evaluation (Signed)
Anesthesia Evaluation  Patient identified by MRN, date of birth, ID band Patient awake    Reviewed: Allergy & Precautions, NPO status , Patient's Chart, lab work & pertinent test results  Airway Mallampati: I  TM Distance: >3 FB Neck ROM: Full    Dental  (+) Teeth Intact, Dental Advisory Given   Pulmonary asthma , COPDformer smoker,  breath sounds clear to auscultation        Cardiovascular Rhythm:Regular Rate:Normal     Neuro/Psych    GI/Hepatic GERD-  Medicated and Controlled,  Endo/Other    Renal/GU      Musculoskeletal   Abdominal   Peds  Hematology   Anesthesia Other Findings   Reproductive/Obstetrics                             Anesthesia Physical Anesthesia Plan  ASA: II  Anesthesia Plan: General   Post-op Pain Management:    Induction: Intravenous  Airway Management Planned: LMA  Additional Equipment:   Intra-op Plan:   Post-operative Plan: Extubation in OR  Informed Consent: I have reviewed the patients History and Physical, chart, labs and discussed the procedure including the risks, benefits and alternatives for the proposed anesthesia with the patient or authorized representative who has indicated his/her understanding and acceptance.   Dental advisory given  Plan Discussed with: CRNA, Surgeon and Anesthesiologist  Anesthesia Plan Comments:         Anesthesia Quick Evaluation

## 2014-12-09 NOTE — Anesthesia Procedure Notes (Signed)
Procedure Name: LMA Insertion Date/Time: 12/09/2014 11:52 AM Performed by: Lyndee Leo Pre-anesthesia Checklist: Patient identified, Emergency Drugs available, Suction available and Patient being monitored Patient Re-evaluated:Patient Re-evaluated prior to inductionOxygen Delivery Method: Circle System Utilized Preoxygenation: Pre-oxygenation with 100% oxygen Intubation Type: IV induction Ventilation: Mask ventilation without difficulty LMA: LMA inserted LMA Size: 4.0 Number of attempts: 1 Airway Equipment and Method: bite block Placement Confirmation: positive ETCO2 Tube secured with: Tape Dental Injury: Teeth and Oropharynx as per pre-operative assessment

## 2014-12-09 NOTE — Transfer of Care (Signed)
Immediate Anesthesia Transfer of Care Note  Patient: Samuel Richmond  Procedure(s) Performed: Procedure(s): RELEASE A-1 PULLEY RIGHT MIDDLE FINGER,RELEASE A-1 PULLEY LEFT RING FINGER (Bilateral)  Patient Location: PACU  Anesthesia Type:General  Level of Consciousness: awake, sedated and patient cooperative  Airway & Oxygen Therapy: Patient Spontanous Breathing and Patient connected to face mask oxygen  Post-op Assessment: Report given to PACU RN and Post -op Vital signs reviewed and stable  Post vital signs: Reviewed and stable  Complications: No apparent anesthesia complications

## 2014-12-09 NOTE — H&P (Signed)
Samuel Richmond is a 59 year-old right-hand dominant, former patient of Dr. Daylene Katayama, who has not been seen for two years.  He comes in complaining of catching of his right middle and left ring fingers.  He states this has been going on for six months.  He has had trigger fingers released in the past, slowly had Dupuytren's, he is of [place] descent.  He does have nodules on his feet, does not have on his penis.  He has no history of diabetes, thyroid problems. He does have history of arthritis.  No history of gout.  He has not had any treatment or tried anything for this.    ALLERGIES:   Levaquin.  MEDICATIONS:     None.  SURGICAL HISTORY:    Trigger finger releases three years ago, prostate surgery and a total knee replacement.   FAMILY MEDICAL HISTORY:    Positive for arthritis, otherwise negative.   SOCIAL HISTORY:     He does not smoke or drink.  He is married and works as a Freight forwarder at [employer]:     Positive for glasses, asthma, depression, otherwise negative 14 points. Samuel Richmond is an 59 y.o. male.   Chief Complaint: trigger  of his right middle, left ring finger HPI: see above  Past Medical History  Diagnosis Date  . Asthma   . Attention deficit hyperactivity disorder (ADHD)   . Dyslipidemia   . Esophageal reflux   . Elevated PSA 11/04/2012    5.33  . COPD (chronic obstructive pulmonary disease)   . Arthritis   . Benign prostatic hypertrophy   . 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 Prostate cancer 02/10/13    gleason 6 (3+3) and 7 (4+3) on right side, volume 28.87 cc  . Depression   . Anxiety     Past Surgical History  Procedure Laterality Date  . Trigger finger release  09/04/2012    Procedure: MINOR RELEASE TRIGGER FINGER/A-1 PULLEY;  Surgeon: Cammie Sickle., MD;  Location: [facility];  Service: Orthopedics;  Laterality: Left;  left long and cyst excision left long  . Hernia repair    . Knee surgery       x 5  . Nasal sinus surgery    . Robot assisted laparoscopic radical prostatectomy Bilateral 03/26/2013    Procedure: ROBOTIC ASSISTED LAPAROSCOPIC RADICAL PROSTATECTOMY LEVEL 2;  Surgeon: Dutch Gray, MD;  Location: [facility];  Service: Urology;  Laterality: Bilateral;  3 HRS   . Lymphadenectomy Bilateral 03/26/2013    Procedure: LYMPHADENECTOMY;  Surgeon: Dutch Gray, MD;  Location: [facility];  Service: Urology;  Laterality: Bilateral;  . Total knee arthroplasty Left 07/15/2013    Dr Percell Miller  . Total knee arthroplasty Left 07/15/2013    Procedure: TOTAL KNEE ARTHROPLASTY;  Surgeon: Ninetta Lights, MD;  Location: [facility];  Service: Orthopedics;  Laterality: Left;  . Joint replacement Left 2014    Family History  Problem Relation Age of Onset  . Prostate cancer Father    Social History:  reports that he quit smoking about 16 years ago. He has never used smokeless tobacco. He reports that he does not drink alcohol or use illicit drugs.  Allergies:  Allergies  Allergen Reactions  . Levaquin [Levofloxacin] Other (See Comments)    Cramps in legs, chills    Medications Prior to Admission  Medication Sig Dispense Refill  . buPROPion (WELLBUTRIN XL) 150 MG 24 hr tablet Take 1 tablet (  150 mg total) by mouth daily. 30 tablet 3  . escitalopram (LEXAPRO) 20 MG tablet Take 1 tablet (20 mg total) by mouth daily. 30 tablet 6  . naproxen sodium (ANAPROX) 220 MG tablet Take 220 mg by mouth 2 (two) times daily with a meal.    . omeprazole (PRILOSEC) 20 MG capsule Take 1 capsule (20 mg total) by mouth daily. 30 capsule 3  . Fluticasone-Salmeterol (ADVAIR) 100-50 MCG/DOSE AEPB Inhale 1 puff into the lungs every 12 (twelve) hours.    . Omega-3 Fatty Acids (FISH OIL) 1000 MG CAPS Take 1 capsule by mouth 2 (two) times daily.    . Sildenafil Citrate (VIAGRA PO) Take by mouth.      No results found for this or any previous visit (from the past 48 hour(s)).  No results found.   Pertinent items are noted in  HPI.  Blood pressure 139/77, pulse 63, temperature 97.1 F (36.2 C), temperature source Oral, resp. rate 20, height 6' (1.829 m), weight 86.183 kg (190 lb), SpO2 100 %.  General appearance: alert, cooperative and appears stated age Head: Normocephalic, without obvious abnormality Neck: no JVD Resp: clear to auscultation bilaterally Cardio: regular rate and rhythm, S1, S2 normal, no murmur, click, rub or gallop GI: normal findings: bowel sounds normal Extremities: trigger of his right middle, left ring finger Pulses: 2+ and symmetric Skin: Skin color, texture, turgor normal. No rashes or lesions Neurologic: Grossly normal Incision/Wound: na  Assessment/Plan RADIOGRAPHS:    X-rays reveal very early degenerative changes, multiple DIP joints, otherwise essentially negative.   DIAGNOSIS:     Degenerative arthritis, probable Dupuytren's, history of triggering of his right middle, left ring finger.  We have discussed the etiology of Dupuytren's with him, the etiology of trigger fingers.   Pre, peri and post op care are discussed along with risks and complications. Patient is aware there is no guarantee with surgery, possibility of infection, injury to arteries, nerves, and tendons, incomplete relief and dystrophy. He would like to proceed and he  is scheduled for release A-1 pulley of his right middle, left ring finger as an outpatient under regional anesthesia.  Carlene Bickley R 12/09/2014, 10:36 AM

## 2014-12-09 NOTE — Discharge Instructions (Addendum)

## 2014-12-09 NOTE — Brief Op Note (Signed)
12/09/2014  12:50 PM  PATIENT:  Tomasita Morrow  59 y.o. male  PRE-OPERATIVE DIAGNOSIS:  STENOSING TENOSYNOVITIS RIGHT MIDDLE FINGER AND LEFT RING FINGER  POST-OPERATIVE DIAGNOSIS:  Stenosing Tenosynovitis Right Middle finger and Left Ring finger  PROCEDURE:  Procedure(s): RELEASE A-1 PULLEY RIGHT MIDDLE FINGER,RELEASE A-1 PULLEY LEFT RING FINGER (Bilateral)  SURGEON:  Surgeon(s) and Role:    * Daryll Brod, MD - Primary  PHYSICIAN ASSISTANT:   ASSISTANTS: none   ANESTHESIA:   local and general  EBL:  Total I/O In: 500 [I.V.:500] Out: -   BLOOD ADMINISTERED:none  DRAINS: none   LOCAL MEDICATIONS USED:  BUPIVICAINE   SPECIMEN:  No Specimen  DISPOSITION OF SPECIMEN:  N/A  COUNTS:  YES  TOURNIQUET:   Total Tourniquet Time Documented: Upper Arm (Right) - 17 minutes Total: Upper Arm (Right) - 17 minutes  Forearm (Left) - 18 minutes Total: Forearm (Left) - 18 minutes   DICTATION: .Other Dictation: Dictation Number [id number]  PLAN OF CARE: Discharge to home after PACU  PATIENT DISPOSITION:  PACU - hemodynamically stable.

## 2014-12-09 NOTE — Op Note (Signed)
Dictation Number [id number]

## 2014-12-09 NOTE — Anesthesia Postprocedure Evaluation (Signed)
  Anesthesia Post-op Note  Patient: Samuel Richmond  Procedure(s) Performed: Procedure(s): RELEASE A-1 PULLEY RIGHT MIDDLE FINGER,RELEASE A-1 PULLEY LEFT RING FINGER (Bilateral)  Patient Location: PACU  Anesthesia Type: General   Level of Consciousness: awake, alert  and oriented  Airway and Oxygen Therapy: Patient Spontanous Breathing  Post-op Pain: mild  Post-op Assessment: Post-op Vital signs reviewed  Post-op Vital Signs: Reviewed  Last Vitals:  Filed Vitals:   12/09/14 1400  BP: 126/71  Pulse: 64  Temp: 36.4 C  Resp: 18    Complications: No apparent anesthesia complications

## 2014-12-10 ENCOUNTER — Encounter (HOSPITAL_BASED_OUTPATIENT_CLINIC_OR_DEPARTMENT_OTHER): Payer: Self-pay | Admitting: Orthopedic Surgery

## 2014-12-10 NOTE — Op Note (Signed)
NAME:  Samuel Richmond, VILORIA NO.:  [id number]  MEDICAL RECORD NO.:  [id number]  LOCATION:                                 FACILITY:  PHYSICIAN:  Daryll Brod, M.D.            DATE OF BIRTH:  DATE OF PROCEDURE:  12/09/2014 DATE OF DISCHARGE:                              OPERATIVE REPORT   PREOPERATIVE DIAGNOSIS:  Stenosing tenosynovitis, right middle finger; stenosing tenosynovitis, left ring finger.  POSTOPERATIVE DIAGNOSIS:  Stenosing tenosynovitis, right middle finger; stenosing tenosynovitis, left ring finger.  OPERATION: 1. Release of A1 pulley, right middle finger. 2. Release of A1 pulley, left ring finger.  SURGEON:  Daryll Brod, M.D.  ANESTHESIA:  General with local infiltration.  HISTORY:  The patient is a 59 year old male with a history of triggering of his right middle and left ring fingers neither have responded to conservative treatment.  He has elected to undergo surgical decompression of the A1 pulleys of each of those 2 fingers.  Pre, peri, and postoperative course have been discussed along with risks and complications.  He is aware that there is no guarantee with the surgery, possibility of infection; recurrence of injury to arteries, nerves, tendons, incomplete relief of symptoms, dystrophy.  In the preoperative area, the patient is seen, the extremity marked by both the patient and surgeon.  Antibiotic given.  PROCEDURE IN DETAIL:  The patient was brought to the operating room, where general anesthetic was carried out without difficulty under the direction of Dr. Al Corpus.  He was prepped using ChloraPrep in supine position with the right arm free.  A 3-minute dry time was allowed. Time-out taken, confirming the patient and procedure.  The limb was exsanguinated with an Esmarch bandage.  Tourniquet placed and the upper arm was inflated to 250 mmHg.  An oblique incision was made over the metacarpophalangeal joint right middle finger carried down  through subcutaneous tissue.  Bleeders were electrocauterized.  Dissection carried down to the A1 pulley.  Retractors placed protecting the neurovascular bundles radially and ulnarly.  An incision was then made on the radial aspect of the A1 pulley.  A small incision made centrally in A2.  The 2 flexor tendons were then separated.  Partial tenosynovectomy performed proximally.  The finger placed through a full range of motion.  No further triggering was noted.  The wound was copiously irrigated with saline and closed with a subcuticular 4-0 Monocryl suture.  Dermabond was applied and the area injected with 0.25% bupivacaine without epinephrine approximately 4 mL was used.  A sterile compressive dressing with the fingers free was applied.  On deflation of the tourniquet, all fingers immediately pinked.  The left side was then prepped and draped.  Time-out taken, confirming the patient and procedure.  Prep was done with ChloraPrep.  A 3-minute dry time was allowed.  The limb was exsanguinated with an Esmarch bandage. Tourniquet placed on the forearm was inflated to 250 mmHg.  An oblique incision was made over the left ring finger, volar aspect, metacarpophalangeal joint, carried down through subcutaneous tissue. Retractors again placed.  Neurovascular bundles identified and protected.  The A1 pulley was then identified and released on  its radial aspect.  A small incision was made centrally in the A2 pulley.  A partial tenosynovectomy performed proximally with separation of the superficialis profundus tendons to make certain there were not adherent to each other.  Finger placed through a full range of motion.  No further triggering was noted.  The wound was copiously irrigated with saline and the skin closed with a subcuticular 4-0 Monocryl suture.  A local infiltration with 0.25%  bupivacaine without epinephrine was given.  Dermabond applied after cleaning the area with alcohol as was done  on the right side.  A sterile compressive dressing with the fingers free was applied.  On deflation of the tourniquet, all fingers immediately pinked.  He was taken to the recovery room for observation in satisfactory condition.  He will be discharged home to return to the [facility] in 1 week on [facility].          ______________________________ Daryll Brod, M.D.     GK/MEDQ  D:  12/09/2014  T:  12/10/2014  Job:  [id number]

## 2014-12-15 ENCOUNTER — Ambulatory Visit
Admission: RE | Admit: 2014-12-15 | Discharge: 2014-12-15 | Disposition: A | Discharge status: Home / Self Care | Payer: BLUE CROSS/BLUE SHIELD | Source: Ambulatory Visit | Attending: Radiation Oncology | Admitting: Radiation Oncology

## 2014-12-15 DIAGNOSIS — Z510 Encounter for antineoplastic radiation therapy: Secondary | ICD-10-CM | POA: Diagnosis not present

## 2014-12-15 DIAGNOSIS — C61 Malignant neoplasm of prostate: Secondary | ICD-10-CM

## 2014-12-15 NOTE — Progress Notes (Signed)
Chart note: The patient began his IMRT today in the management of his carcinoma the prostate.  He is being treated with dual ARC VMAT IMRT with 2 sets of dynamic MLCs representing one set of IMRT treatment devices (32122).

## 2014-12-16 ENCOUNTER — Ambulatory Visit
Admission: RE | Admit: 2014-12-16 | Discharge: 2014-12-16 | Disposition: A | Discharge status: Home / Self Care | Payer: BLUE CROSS/BLUE SHIELD | Source: Ambulatory Visit | Attending: Radiation Oncology | Admitting: Radiation Oncology

## 2014-12-16 DIAGNOSIS — Z510 Encounter for antineoplastic radiation therapy: Secondary | ICD-10-CM | POA: Diagnosis not present

## 2014-12-17 ENCOUNTER — Ambulatory Visit
Admission: RE | Admit: 2014-12-17 | Discharge: 2014-12-17 | Disposition: A | Discharge status: Home / Self Care | Payer: BLUE CROSS/BLUE SHIELD | Source: Ambulatory Visit | Attending: Radiation Oncology | Admitting: Radiation Oncology

## 2014-12-17 DIAGNOSIS — Z510 Encounter for antineoplastic radiation therapy: Secondary | ICD-10-CM | POA: Diagnosis not present

## 2014-12-20 ENCOUNTER — Ambulatory Visit
Admission: RE | Admit: 2014-12-20 | Discharge: 2014-12-20 | Disposition: A | Discharge status: Home / Self Care | Payer: BLUE CROSS/BLUE SHIELD | Source: Ambulatory Visit | Attending: Radiation Oncology | Admitting: Radiation Oncology

## 2014-12-20 ENCOUNTER — Encounter: Payer: Self-pay | Admitting: Radiation Oncology

## 2014-12-20 VITALS — BP 126/80 | HR 65 | Temp 98.3°F | Resp 12 | Wt 191.2 lb

## 2014-12-20 DIAGNOSIS — C61 Malignant neoplasm of prostate: Secondary | ICD-10-CM

## 2014-12-20 DIAGNOSIS — Z510 Encounter for antineoplastic radiation therapy: Secondary | ICD-10-CM | POA: Diagnosis not present

## 2014-12-20 NOTE — Addendum Note (Signed)
Encounter addended by: Jenene Slicker, RN on: 12/20/2014  4:53 PM<BR>     Documentation filed: Notes Section

## 2014-12-20 NOTE — Progress Notes (Signed)
Weekly Management Note:  Site: Prostate bed Current Dose:  800  cGy Projected Dose: 6600  cGy  Narrative: The patient is seen today for routine under treatment assessment. CBCT/MVCT images/port films were reviewed. The chart was reviewed.   Bladder filling satisfactory.  His bladder filling on day 1 was extreme.  I believe that he now knows the sensation of a "comfortably full bladder".  No GI difficulties.  Physical Examination:  Filed Vitals:   12/20/14 1612  BP: 126/80  Pulse: 65  Temp: 98.3 F (36.8 C)  Resp: 12  .  Weight: 191 lb 3.2 oz (86.728 kg).  No change.  Impression: Tolerating radiation therapy well.  Plan: Continue radiation therapy as planned.

## 2014-12-20 NOTE — Progress Notes (Signed)
Pt here for patient teaching.  Radiation and You booklet given with areas of pertinence flagged and highlighted.  Reviewed diarrhea, fatigue, hair loss, nausea, vomiting, sexual and fertility changes, skin changes, urinary and bladder changes. Pt able to give teach back of to use unscented baby wipes after bowel movements, to have Imodium on hand, avoid caffeine beverages if having bothersome urinary or bowel symptoms. Pt demonstrated understanding of information given and will contact nursing with any questions or concerns.

## 2014-12-20 NOTE — Progress Notes (Signed)
He is currently in no pain.  Denies abnormal urinary symptoms.  Pt states they urinate 1 - 2 times per night.  Pt reports soft bowel movement every day. BP 126/80 mmHg  Pulse 65  Temp(Src) 98.3 F (36.8 C) (Oral)  Resp 12  Wt 191 lb 3.2 oz (86.728 kg)  SpO2 100%

## 2014-12-21 ENCOUNTER — Ambulatory Visit
Admission: RE | Admit: 2014-12-21 | Discharge: 2014-12-21 | Disposition: A | Discharge status: Home / Self Care | Payer: BLUE CROSS/BLUE SHIELD | Source: Ambulatory Visit | Attending: Radiation Oncology | Admitting: Radiation Oncology

## 2014-12-21 DIAGNOSIS — Z510 Encounter for antineoplastic radiation therapy: Secondary | ICD-10-CM | POA: Diagnosis not present

## 2014-12-22 ENCOUNTER — Encounter: Payer: Self-pay | Admitting: Family Medicine

## 2014-12-22 ENCOUNTER — Ambulatory Visit
Admission: RE | Admit: 2014-12-22 | Discharge: 2014-12-22 | Disposition: A | Discharge status: Home / Self Care | Payer: BLUE CROSS/BLUE SHIELD | Source: Ambulatory Visit | Attending: Radiation Oncology | Admitting: Radiation Oncology

## 2014-12-22 DIAGNOSIS — Z510 Encounter for antineoplastic radiation therapy: Secondary | ICD-10-CM | POA: Diagnosis not present

## 2014-12-23 ENCOUNTER — Ambulatory Visit
Admission: RE | Admit: 2014-12-23 | Discharge: 2014-12-23 | Disposition: A | Discharge status: Home / Self Care | Payer: BLUE CROSS/BLUE SHIELD | Source: Ambulatory Visit | Attending: Radiation Oncology | Admitting: Radiation Oncology

## 2014-12-23 ENCOUNTER — Other Ambulatory Visit: Payer: Self-pay | Admitting: General Practice

## 2014-12-23 DIAGNOSIS — Z510 Encounter for antineoplastic radiation therapy: Secondary | ICD-10-CM | POA: Diagnosis not present

## 2014-12-23 MED ORDER — BUPROPION HCL ER (XL) 150 MG PO TB24: 150.0000 mg | ORAL_TABLET | Freq: Every day | ORAL | Status: DC

## 2014-12-23 MED ORDER — TIOTROPIUM BROMIDE MONOHYDRATE 1.25 MCG/ACT IN AERS: 2.0000 | INHALATION_SPRAY | Freq: Every day | RESPIRATORY_TRACT | Status: DC

## 2014-12-24 ENCOUNTER — Ambulatory Visit: Payer: BLUE CROSS/BLUE SHIELD

## 2014-12-27 ENCOUNTER — Ambulatory Visit: Payer: BLUE CROSS/BLUE SHIELD

## 2014-12-27 ENCOUNTER — Ambulatory Visit
Admission: RE | Admit: 2014-12-27 | Payer: BLUE CROSS/BLUE SHIELD | Source: Ambulatory Visit | Admitting: Radiation Oncology

## 2014-12-28 ENCOUNTER — Ambulatory Visit
Admission: RE | Admit: 2014-12-28 | Discharge: 2014-12-28 | Disposition: A | Discharge status: Home / Self Care | Payer: BLUE CROSS/BLUE SHIELD | Source: Ambulatory Visit | Attending: Radiation Oncology | Admitting: Radiation Oncology

## 2014-12-28 ENCOUNTER — Encounter: Payer: Self-pay | Admitting: Radiation Oncology

## 2014-12-28 VITALS — BP 119/76 | HR 70 | Temp 98.2°F | Ht 72.0 in | Wt 193.7 lb

## 2014-12-28 DIAGNOSIS — C61 Malignant neoplasm of prostate: Secondary | ICD-10-CM

## 2014-12-28 DIAGNOSIS — Z510 Encounter for antineoplastic radiation therapy: Secondary | ICD-10-CM | POA: Diagnosis not present

## 2014-12-28 NOTE — Progress Notes (Signed)
Samuel Richmond reports no changes in status since start of XRT.

## 2014-12-28 NOTE — Progress Notes (Signed)
Weekly Management Note:  Site: Prostate bed  Current Dose:  1600  cGy Projected Dose: 6600  cGy  Narrative: The patient is seen today for routine under treatment assessment. CBCT/MVCT images/port films were reviewed. The chart was reviewed.   Bladder filling remains excellent.  No new GU or GI difficulty.  Physical Examination:  Filed Vitals:   12/28/14 1641  BP: 119/76  Pulse: 70  Temp: 98.2 F (36.8 C)  .  Weight: 193 lb 11.2 oz (87.862 kg).  No change.  Impression: Tolerating radiation therapy well.  Plan: Continue radiation therapy as planned.

## 2014-12-29 ENCOUNTER — Ambulatory Visit
Admission: RE | Admit: 2014-12-29 | Discharge: 2014-12-29 | Disposition: A | Discharge status: Home / Self Care | Payer: BLUE CROSS/BLUE SHIELD | Source: Ambulatory Visit | Attending: Radiation Oncology | Admitting: Radiation Oncology

## 2014-12-29 DIAGNOSIS — Z510 Encounter for antineoplastic radiation therapy: Secondary | ICD-10-CM | POA: Diagnosis not present

## 2014-12-30 ENCOUNTER — Ambulatory Visit
Admission: RE | Admit: 2014-12-30 | Discharge: 2014-12-30 | Disposition: A | Discharge status: Home / Self Care | Payer: BLUE CROSS/BLUE SHIELD | Source: Ambulatory Visit | Attending: Radiation Oncology | Admitting: Radiation Oncology

## 2014-12-30 DIAGNOSIS — Z510 Encounter for antineoplastic radiation therapy: Secondary | ICD-10-CM | POA: Diagnosis not present

## 2014-12-31 ENCOUNTER — Ambulatory Visit
Admission: RE | Admit: 2014-12-31 | Discharge: 2014-12-31 | Disposition: A | Discharge status: Home / Self Care | Payer: BLUE CROSS/BLUE SHIELD | Source: Ambulatory Visit | Attending: Radiation Oncology | Admitting: Radiation Oncology

## 2014-12-31 DIAGNOSIS — Z510 Encounter for antineoplastic radiation therapy: Secondary | ICD-10-CM | POA: Diagnosis not present

## 2015-01-03 ENCOUNTER — Ambulatory Visit
Admission: RE | Admit: 2015-01-03 | Discharge: 2015-01-03 | Disposition: A | Discharge status: Home / Self Care | Payer: BLUE CROSS/BLUE SHIELD | Source: Ambulatory Visit | Attending: Radiation Oncology | Admitting: Radiation Oncology

## 2015-01-03 ENCOUNTER — Encounter: Payer: Self-pay | Admitting: Radiation Oncology

## 2015-01-03 VITALS — BP 138/78 | HR 82 | Temp 98.9°F | Resp 12 | Wt 192.2 lb

## 2015-01-03 DIAGNOSIS — Z510 Encounter for antineoplastic radiation therapy: Secondary | ICD-10-CM | POA: Diagnosis not present

## 2015-01-03 DIAGNOSIS — C61 Malignant neoplasm of prostate: Secondary | ICD-10-CM

## 2015-01-03 NOTE — Progress Notes (Signed)
He is currently in no pain.  Pt reports urinary frequency, urgency and retention. Pt states they urinate 1 - 2 times per night.  Pt reports soft bowel movement every day.  BP 138/78 mmHg  Pulse 82  Temp(Src) 98.9 F (37.2 C) (Oral)  Resp 12  Wt 192 lb 3.2 oz (87.181 kg)  SpO2 100%

## 2015-01-03 NOTE — Progress Notes (Signed)
Weekly Management Note:  Site: Prostate bed Current Dose:  2400  cGy Projected Dose: 6600  cGy  Narrative: The patient is seen today for routine under treatment assessment. CBCT/MVCT images/port films were reviewed. The chart was reviewed.   Bladder filling is excellent.  No new GU or GI difficulties.  Physical Examination:  Filed Vitals:   01/03/15 1635  BP: 138/78  Pulse: 82  Temp: 98.9 F (37.2 C)  Resp: 12  .  Weight: 192 lb 3.2 oz (87.181 kg).  No change.  Impression: Tolerating radiation therapy well.  Plan: Continue radiation therapy as planned.

## 2015-01-04 ENCOUNTER — Ambulatory Visit
Admission: RE | Admit: 2015-01-04 | Discharge: 2015-01-04 | Disposition: A | Discharge status: Home / Self Care | Payer: BLUE CROSS/BLUE SHIELD | Source: Ambulatory Visit | Attending: Radiation Oncology | Admitting: Radiation Oncology

## 2015-01-04 DIAGNOSIS — Z510 Encounter for antineoplastic radiation therapy: Secondary | ICD-10-CM | POA: Diagnosis not present

## 2015-01-05 ENCOUNTER — Ambulatory Visit
Admission: RE | Admit: 2015-01-05 | Discharge: 2015-01-05 | Disposition: A | Discharge status: Home / Self Care | Payer: BLUE CROSS/BLUE SHIELD | Source: Ambulatory Visit | Attending: Radiation Oncology | Admitting: Radiation Oncology

## 2015-01-05 DIAGNOSIS — Z510 Encounter for antineoplastic radiation therapy: Secondary | ICD-10-CM | POA: Diagnosis not present

## 2015-01-06 ENCOUNTER — Ambulatory Visit
Admission: RE | Admit: 2015-01-06 | Discharge: 2015-01-06 | Disposition: A | Discharge status: Home / Self Care | Payer: BLUE CROSS/BLUE SHIELD | Source: Ambulatory Visit | Attending: Radiation Oncology | Admitting: Radiation Oncology

## 2015-01-06 DIAGNOSIS — Z510 Encounter for antineoplastic radiation therapy: Secondary | ICD-10-CM | POA: Diagnosis not present

## 2015-01-07 ENCOUNTER — Ambulatory Visit
Admission: RE | Admit: 2015-01-07 | Discharge: 2015-01-07 | Disposition: A | Discharge status: Home / Self Care | Payer: BLUE CROSS/BLUE SHIELD | Source: Ambulatory Visit | Attending: Radiation Oncology | Admitting: Radiation Oncology

## 2015-01-07 DIAGNOSIS — Z510 Encounter for antineoplastic radiation therapy: Secondary | ICD-10-CM | POA: Diagnosis not present

## 2015-01-10 ENCOUNTER — Ambulatory Visit
Admission: RE | Admit: 2015-01-10 | Discharge: 2015-01-10 | Disposition: A | Discharge status: Home / Self Care | Payer: BLUE CROSS/BLUE SHIELD | Source: Ambulatory Visit | Attending: Radiation Oncology | Admitting: Radiation Oncology

## 2015-01-10 VITALS — BP 133/81 | HR 65 | Temp 98.4°F | Wt 195.7 lb

## 2015-01-10 DIAGNOSIS — Z510 Encounter for antineoplastic radiation therapy: Secondary | ICD-10-CM | POA: Diagnosis not present

## 2015-01-10 DIAGNOSIS — C61 Malignant neoplasm of prostate: Secondary | ICD-10-CM

## 2015-01-10 NOTE — Progress Notes (Signed)
Weekly assessment of radiation to prostate bed.completed treatment #17 today.Denies pain or any urinary or bowel problems.Nocturia x 2.

## 2015-01-10 NOTE — Progress Notes (Signed)
Weekly Management Note:  Site: prostate bed Current Dose:   3400  cGy Projected Dose:  6600  cGy  Narrative: The patient is seen today for routine under treatment assessment. CBCT/MVCT images/port films were reviewed. The chart was reviewed.    Bladder full today was satisfactory but suboptimal. He lost track of time and did not have adequate bladder filling prior to his arrival. No GU or GI difficulties.  Physical Examination:  Filed Vitals:   01/10/15 1636  BP: 133/81  Pulse: 65  Temp: 98.4 F (36.9 C)  .  Weight: 195 lb 11.2 oz (88.769 kg).  No change.  Impression: Tolerating radiation therapy well.  Plan: Continue radiation therapy as planned.

## 2015-01-11 ENCOUNTER — Ambulatory Visit
Admission: RE | Admit: 2015-01-11 | Discharge: 2015-01-11 | Disposition: A | Discharge status: Home / Self Care | Payer: BLUE CROSS/BLUE SHIELD | Source: Ambulatory Visit | Attending: Radiation Oncology | Admitting: Radiation Oncology

## 2015-01-11 DIAGNOSIS — Z510 Encounter for antineoplastic radiation therapy: Secondary | ICD-10-CM | POA: Diagnosis not present

## 2015-01-12 ENCOUNTER — Ambulatory Visit
Admission: RE | Admit: 2015-01-12 | Discharge: 2015-01-12 | Disposition: A | Discharge status: Home / Self Care | Payer: BLUE CROSS/BLUE SHIELD | Source: Ambulatory Visit | Attending: Radiation Oncology | Admitting: Radiation Oncology

## 2015-01-12 DIAGNOSIS — Z510 Encounter for antineoplastic radiation therapy: Secondary | ICD-10-CM | POA: Diagnosis not present

## 2015-01-13 ENCOUNTER — Ambulatory Visit
Admission: RE | Admit: 2015-01-13 | Discharge: 2015-01-13 | Disposition: A | Discharge status: Home / Self Care | Payer: BLUE CROSS/BLUE SHIELD | Source: Ambulatory Visit | Attending: Radiation Oncology | Admitting: Radiation Oncology

## 2015-01-13 ENCOUNTER — Encounter: Payer: Self-pay | Admitting: Family Medicine

## 2015-01-13 DIAGNOSIS — Z510 Encounter for antineoplastic radiation therapy: Secondary | ICD-10-CM | POA: Diagnosis not present

## 2015-01-14 ENCOUNTER — Ambulatory Visit
Admission: RE | Admit: 2015-01-14 | Discharge: 2015-01-14 | Disposition: A | Discharge status: Home / Self Care | Payer: BLUE CROSS/BLUE SHIELD | Source: Ambulatory Visit | Attending: Radiation Oncology | Admitting: Radiation Oncology

## 2015-01-14 DIAGNOSIS — Z510 Encounter for antineoplastic radiation therapy: Secondary | ICD-10-CM | POA: Diagnosis not present

## 2015-01-17 ENCOUNTER — Encounter: Payer: Self-pay | Admitting: Radiation Oncology

## 2015-01-17 ENCOUNTER — Ambulatory Visit
Admission: RE | Admit: 2015-01-17 | Discharge: 2015-01-17 | Disposition: A | Discharge status: Home / Self Care | Payer: BLUE CROSS/BLUE SHIELD | Source: Ambulatory Visit | Attending: Radiation Oncology | Admitting: Radiation Oncology

## 2015-01-17 VITALS — BP 127/81 | HR 62 | Temp 98.1°F | Resp 20 | Wt 191.6 lb

## 2015-01-17 DIAGNOSIS — C61 Malignant neoplasm of prostate: Secondary | ICD-10-CM

## 2015-01-17 DIAGNOSIS — Z510 Encounter for antineoplastic radiation therapy: Secondary | ICD-10-CM | POA: Diagnosis not present

## 2015-01-17 NOTE — Progress Notes (Signed)
Weekly rad txs prostate 22 completed, no dysuria,hematuria, regular bowel movements, no c/o"everything is good", appetite good no fatigue, no pain 3:05 PM

## 2015-01-17 NOTE — Progress Notes (Signed)
Weekly Management Note:  Site: Prostate bed Current Dose:  4400  cGy Projected Dose: 6000  cGy  Narrative: The patient is seen today for routine under treatment assessment. CBCT/MVCT images/port films were reviewed. The chart was reviewed.   Bladder filling is excellent.  No new GU or GI difficulties.  Physical Examination:  Filed Vitals:   01/17/15 1503  BP: 127/81  Pulse: 62  Temp: 98.1 F (36.7 C)  Resp: 20  .  Weight: 191 lb 9.6 oz (86.909 kg).  No change.  Impression: Tolerating radiation therapy well.  Plan: Continue radiation therapy as planned.

## 2015-01-18 ENCOUNTER — Ambulatory Visit
Admission: RE | Admit: 2015-01-18 | Discharge: 2015-01-18 | Disposition: A | Discharge status: Home / Self Care | Payer: BLUE CROSS/BLUE SHIELD | Source: Ambulatory Visit | Attending: Radiation Oncology | Admitting: Radiation Oncology

## 2015-01-18 DIAGNOSIS — Z510 Encounter for antineoplastic radiation therapy: Secondary | ICD-10-CM | POA: Diagnosis not present

## 2015-01-19 ENCOUNTER — Ambulatory Visit
Admission: RE | Admit: 2015-01-19 | Discharge: 2015-01-19 | Disposition: A | Discharge status: Home / Self Care | Payer: BLUE CROSS/BLUE SHIELD | Source: Ambulatory Visit | Attending: Radiation Oncology | Admitting: Radiation Oncology

## 2015-01-19 DIAGNOSIS — Z510 Encounter for antineoplastic radiation therapy: Secondary | ICD-10-CM | POA: Diagnosis not present

## 2015-01-20 ENCOUNTER — Ambulatory Visit
Admission: RE | Admit: 2015-01-20 | Discharge: 2015-01-20 | Disposition: A | Discharge status: Home / Self Care | Payer: BLUE CROSS/BLUE SHIELD | Source: Ambulatory Visit | Attending: Radiation Oncology | Admitting: Radiation Oncology

## 2015-01-20 DIAGNOSIS — Z510 Encounter for antineoplastic radiation therapy: Secondary | ICD-10-CM | POA: Diagnosis not present

## 2015-01-21 ENCOUNTER — Ambulatory Visit
Admission: RE | Admit: 2015-01-21 | Discharge: 2015-01-21 | Disposition: A | Discharge status: Home / Self Care | Payer: BLUE CROSS/BLUE SHIELD | Source: Ambulatory Visit | Attending: Radiation Oncology | Admitting: Radiation Oncology

## 2015-01-21 DIAGNOSIS — Z510 Encounter for antineoplastic radiation therapy: Secondary | ICD-10-CM | POA: Diagnosis not present

## 2015-01-24 ENCOUNTER — Ambulatory Visit
Admission: RE | Admit: 2015-01-24 | Discharge: 2015-01-24 | Disposition: A | Discharge status: Home / Self Care | Payer: BLUE CROSS/BLUE SHIELD | Source: Ambulatory Visit | Attending: Radiation Oncology | Admitting: Radiation Oncology

## 2015-01-24 ENCOUNTER — Encounter: Payer: Self-pay | Admitting: Radiation Oncology

## 2015-01-24 VITALS — BP 120/80 | HR 68 | Temp 98.1°F | Resp 12 | Wt 197.0 lb

## 2015-01-24 DIAGNOSIS — Z510 Encounter for antineoplastic radiation therapy: Secondary | ICD-10-CM | POA: Diagnosis not present

## 2015-01-24 DIAGNOSIS — C61 Malignant neoplasm of prostate: Secondary | ICD-10-CM

## 2015-01-24 NOTE — Progress Notes (Signed)
He is currently in no pain.  Pt denies urinary abnormalities. Pt states they urinate 1 - 2 times per night.  Pt reports a soft bowel movement everyday/everyother day. BP 120/80 mmHg  Pulse 68  Temp(Src) 98.1 F (36.7 C) (Oral)  Resp 12  Wt 197 lb (89.359 kg)  SpO2 100%

## 2015-01-24 NOTE — Progress Notes (Signed)
Weekly Management Note:  Site: Prostate bed Current Dose:  5400  cGy Projected Dose: 6600  cGy  Narrative: The patient is seen today for routine under treatment assessment. CBCT/MVCT images/port films were reviewed. The chart was reviewed.   Bladder filling is excellent.  No significant GU or GI difficulties.  Physical Examination:  Filed Vitals:   01/24/15 1640  BP: 120/80  Pulse: 68  Temp: 98.1 F (36.7 C)  Resp: 12  .  Weight: 197 lb (89.359 kg).  No change.  Impression: Tolerating radiation therapy well.  Plan: Continue radiation therapy as planned.

## 2015-01-25 ENCOUNTER — Ambulatory Visit
Admission: RE | Admit: 2015-01-25 | Discharge: 2015-01-25 | Disposition: A | Discharge status: Home / Self Care | Payer: BLUE CROSS/BLUE SHIELD | Source: Ambulatory Visit | Attending: Radiation Oncology | Admitting: Radiation Oncology

## 2015-01-25 DIAGNOSIS — Z510 Encounter for antineoplastic radiation therapy: Secondary | ICD-10-CM | POA: Diagnosis not present

## 2015-01-26 ENCOUNTER — Ambulatory Visit
Admission: RE | Admit: 2015-01-26 | Discharge: 2015-01-26 | Disposition: A | Discharge status: Home / Self Care | Payer: BLUE CROSS/BLUE SHIELD | Source: Ambulatory Visit | Attending: Radiation Oncology | Admitting: Radiation Oncology

## 2015-01-26 DIAGNOSIS — Z510 Encounter for antineoplastic radiation therapy: Secondary | ICD-10-CM | POA: Diagnosis not present

## 2015-01-27 ENCOUNTER — Ambulatory Visit
Admission: RE | Admit: 2015-01-27 | Discharge: 2015-01-27 | Disposition: A | Discharge status: Home / Self Care | Payer: BLUE CROSS/BLUE SHIELD | Source: Ambulatory Visit | Attending: Radiation Oncology | Admitting: Radiation Oncology

## 2015-01-27 DIAGNOSIS — Z510 Encounter for antineoplastic radiation therapy: Secondary | ICD-10-CM | POA: Diagnosis not present

## 2015-01-28 ENCOUNTER — Ambulatory Visit: Payer: BLUE CROSS/BLUE SHIELD | Admitting: Family Medicine

## 2015-01-28 ENCOUNTER — Ambulatory Visit: Payer: BLUE CROSS/BLUE SHIELD

## 2015-01-28 ENCOUNTER — Ambulatory Visit
Admission: RE | Admit: 2015-01-28 | Discharge: 2015-01-28 | Disposition: A | Discharge status: Home / Self Care | Payer: BLUE CROSS/BLUE SHIELD | Source: Ambulatory Visit | Attending: Radiation Oncology | Admitting: Radiation Oncology

## 2015-01-28 DIAGNOSIS — Z510 Encounter for antineoplastic radiation therapy: Secondary | ICD-10-CM | POA: Diagnosis not present

## 2015-01-31 ENCOUNTER — Ambulatory Visit
Admission: RE | Admit: 2015-01-31 | Discharge: 2015-01-31 | Disposition: A | Discharge status: Home / Self Care | Payer: BLUE CROSS/BLUE SHIELD | Source: Ambulatory Visit | Attending: Radiation Oncology | Admitting: Radiation Oncology

## 2015-01-31 ENCOUNTER — Ambulatory Visit: Payer: BLUE CROSS/BLUE SHIELD

## 2015-01-31 ENCOUNTER — Encounter: Payer: Self-pay | Admitting: Radiation Oncology

## 2015-01-31 VITALS — BP 127/82 | HR 66 | Temp 98.3°F | Resp 12 | Wt 192.6 lb

## 2015-01-31 DIAGNOSIS — Z510 Encounter for antineoplastic radiation therapy: Secondary | ICD-10-CM | POA: Diagnosis not present

## 2015-01-31 DIAGNOSIS — C61 Malignant neoplasm of prostate: Secondary | ICD-10-CM

## 2015-01-31 NOTE — Progress Notes (Signed)
He is currently in no pain.  Pt states they urinate 1 - 2 times per night. Denies abnormal urinary symptoms. Pt reports occasional Constipation with rectal bleeding.  Reports continued rectal tenderness. BP 127/82 mmHg  Pulse 66  Temp(Src) 98.3 F (36.8 C) (Oral)  Resp 12  Wt 192 lb 9.6 oz (87.363 kg)  SpO2 100%

## 2015-01-31 NOTE — Progress Notes (Signed)

## 2015-01-31 NOTE — Progress Notes (Signed)
Weekly Management Note:  Site: Prostate bed Current Dose:  6400  cGy Projected Dose: 6600  cGy  Narrative: The patient is seen today for routine under treatment assessment. CBCT/MVCT images/port films were reviewed. The chart was reviewed.   Bladder filling is satisfactory.  No new GU or GI difficulty except for rectal bleeding over the weekend secondary to constipation.  He has been using Preparation H.  He also started MiraLAX.  He will finish his radiation therapy tomorrow.  Physical Examination:  Filed Vitals:   01/31/15 1644  BP: 127/82  Pulse: 66  Temp: 98.3 F (36.8 C)  Resp: 12  .  Weight: 192 lb 9.6 oz (87.363 kg).  No change.  Impression: Tolerating radiation therapy well.  Plan: Continue radiation therapy as planned.

## 2015-02-01 ENCOUNTER — Ambulatory Visit
Admission: RE | Admit: 2015-02-01 | Discharge: 2015-02-01 | Disposition: A | Discharge status: Home / Self Care | Payer: BLUE CROSS/BLUE SHIELD | Source: Ambulatory Visit | Attending: Radiation Oncology | Admitting: Radiation Oncology

## 2015-02-01 ENCOUNTER — Ambulatory Visit: Payer: BLUE CROSS/BLUE SHIELD

## 2015-02-01 DIAGNOSIS — Z510 Encounter for antineoplastic radiation therapy: Secondary | ICD-10-CM | POA: Diagnosis not present

## 2015-02-02 ENCOUNTER — Encounter: Payer: Self-pay | Admitting: Radiation Oncology

## 2015-02-02 NOTE — Progress Notes (Signed)
[facility] End of Treatment Note  Name:Samuel Richmond  Date: 02/02/2015 OZY:[id number] DOB:07-Mar-1956   Status:outpatient    CC: Annye Asa, MD  Dr. Dutch Gray  REFERRING PHYSICIAN: Dr. Dutch Gray   DIAGNOSIS:  PSA recurrent carcinoma the prostate, pathologic stage pT3a  INDICATION FOR TREATMENT: Curative   TREATMENT DATES: 12/15/2014 through 02/01/2015                          SITE/DOSE:    Prostate bed 6600 cGy in 33 sessions                        BEAMS/ENERGY:  Dual ARC VMAT IMRT with 6 MV photons                 NARRATIVE:  The patient tolerated treatment well with no significant GU or GI toxicity by completion of therapy except for one episode of rectal bleeding secondary to constipation during his last week of therapy.   He started MiraLAX.                      PLAN: Routine followup in one month. Patient instructed to call if questions or worsening complaints in interim.

## 2015-02-03 ENCOUNTER — Other Ambulatory Visit: Payer: Self-pay | Admitting: Dermatology

## 2015-03-02 ENCOUNTER — Encounter: Payer: Self-pay | Admitting: Radiation Oncology

## 2015-03-02 ENCOUNTER — Encounter: Payer: BC Managed Care – PPO | Admitting: Family Medicine

## 2015-03-07 ENCOUNTER — Other Ambulatory Visit: Payer: Self-pay | Admitting: General Practice

## 2015-03-07 MED ORDER — OMEPRAZOLE 20 MG PO CPDR: 20.0000 mg | DELAYED_RELEASE_CAPSULE | Freq: Every day | ORAL | Status: DC

## 2015-03-08 ENCOUNTER — Ambulatory Visit
Admission: RE | Admit: 2015-03-08 | Discharge: 2015-03-08 | Disposition: A | Discharge status: Home / Self Care | Payer: BLUE CROSS/BLUE SHIELD | Source: Ambulatory Visit | Attending: Radiation Oncology | Admitting: Radiation Oncology

## 2015-03-08 ENCOUNTER — Encounter: Payer: Self-pay | Admitting: Radiation Oncology

## 2015-03-08 VITALS — BP 126/79 | Temp 99.0°F | Ht 72.0 in | Wt 199.2 lb

## 2015-03-08 DIAGNOSIS — C61 Malignant neoplasm of prostate: Secondary | ICD-10-CM

## 2015-03-08 HISTORY — DX: Personal history of irradiation: Z92.3

## 2015-03-08 NOTE — Progress Notes (Signed)
Samuel Richmond here for reassessment s/p radiation therapy for prostate cancer.  He reports "occassional stress leakage'.  Denies any dysuria.  Nocturia 1-2 times.  Does not have a future appt. yet with Dr. Gaynelle Arabian.

## 2015-03-08 NOTE — Progress Notes (Signed)
CC: Samuel Richmond  Follow-up note:  [name] visits today approximately 1 month following completion of radiation therapy in the management of his PSA recurrent carcinoma the prostate ( pathologic stage  PT3a).  He is generally doing well from a GU and GI standpoint.  He does have infrequent stress leakage which is not bothersome.  He does not yet have a follow-up appointment with Samuel Richmond.  He tells me that he gets his PSAs at his place of employment.  Physical examination: Alert and oriented. Filed Vitals:   03/08/15 1029  BP: 126/79  Temp: 99 F (37.2 C)   Rectal examination not performed today.  Impression: Satisfactory progress.  He will get a PSA in approximately 1 month and see Samuel Richmond in 2 months.  He will contact Samuel Richmond office to arrange for a follow-up visit.  I've not scheduled the patient for a formal follow-up visit and I ask that Samuel Richmond keep me posted on his progress.  Plan: As above.

## 2015-04-21 ENCOUNTER — Other Ambulatory Visit: Payer: Self-pay | Admitting: General Practice

## 2015-04-21 MED ORDER — OMEPRAZOLE 20 MG PO CPDR: 20.0000 mg | DELAYED_RELEASE_CAPSULE | Freq: Every day | ORAL | Status: DC

## 2015-05-06 ENCOUNTER — Other Ambulatory Visit: Payer: Self-pay | Admitting: Family Medicine

## 2015-05-06 NOTE — Telephone Encounter (Signed)
Med filled.  

## 2015-05-11 ENCOUNTER — Encounter: Payer: Self-pay | Admitting: Family Medicine

## 2015-05-11 NOTE — Telephone Encounter (Signed)
Orders were faxed.

## 2015-05-11 NOTE — Telephone Encounter (Signed)
Ok to fax order for CPE labs?  BMP CBC w/Diff Lipids LFT TSH PSA  Use Routine CPE ICD code or is it ok to use malignant neoplasm of prostate for PSA?

## 2015-05-12 ENCOUNTER — Telehealth: Payer: Self-pay | Admitting: Family Medicine

## 2015-05-12 NOTE — Telephone Encounter (Signed)
Pre Visit letter sent  °

## 2015-05-17 LAB — HEPATIC FUNCTION PANEL
ALT: 17 U/L (ref 10–40)
AST: 18 U/L (ref 14–40)
Alkaline Phosphatase: 71 U/L (ref 25–125)
BILIRUBIN DIRECT: 0.2 mg/dL (ref 0.01–0.4)
Bilirubin, Total: 0.6 mg/dL

## 2015-05-17 LAB — TSH: TSH: 2.5 u[IU]/mL (ref 0.41–5.90)

## 2015-05-17 LAB — LIPID PANEL
Cholesterol: 167 mg/dL (ref 0–200)
HDL: 47 mg/dL (ref 35–70)
LDL CALC: 93 mg/dL
LDL/HDL RATIO: 4
TRIGLYCERIDES: 136 mg/dL (ref 40–160)

## 2015-05-17 LAB — BASIC METABOLIC PANEL
BUN: 16 mg/dL (ref 4–21)
Creatinine: 0.8 mg/dL (ref 0.6–1.3)
GLUCOSE: 89 mg/dL
POTASSIUM: 4.2 mmol/L (ref 3.4–5.3)
Sodium: 139 mmol/L (ref 137–147)

## 2015-05-17 LAB — PSA: PSA: 0.01

## 2015-05-17 LAB — CBC AND DIFFERENTIAL
HCT: 42 % (ref 41–53)
Hemoglobin: 14.2 g/dL (ref 13.5–17.5)
Neutrophils Absolute: 3 /uL
Platelets: 252 10*3/uL (ref 150–399)
WBC: 4.2 10^3/mL

## 2015-05-23 ENCOUNTER — Other Ambulatory Visit: Payer: Self-pay | Admitting: General Practice

## 2015-06-01 ENCOUNTER — Encounter: Payer: Self-pay | Admitting: *Deleted

## 2015-06-01 ENCOUNTER — Telehealth: Payer: Self-pay | Admitting: *Deleted

## 2015-06-01 NOTE — Telephone Encounter (Signed)
Unable to reach patient at time of Pre-Visit Call.  Left message for patient to return call when available.    

## 2015-06-01 NOTE — Addendum Note (Signed)
Addended by: Leticia Penna A on: 06/01/2015 02:04 PM   Modules accepted: Medications

## 2015-06-01 NOTE — Telephone Encounter (Signed)
Pre-Visit Call completed with patient and chart updated.   Pre-Visit Info documented in Specialty Comments under [employer].    

## 2015-06-02 ENCOUNTER — Ambulatory Visit (INDEPENDENT_AMBULATORY_CARE_PROVIDER_SITE_OTHER): Payer: BLUE CROSS/BLUE SHIELD | Admitting: Family Medicine

## 2015-06-02 ENCOUNTER — Encounter: Payer: Self-pay | Admitting: Family Medicine

## 2015-06-02 VITALS — BP 122/80 | HR 85 | Temp 98.2°F | Resp 16 | Ht 71.75 in | Wt 178.1 lb

## 2015-06-02 DIAGNOSIS — Z0000 Encounter for general adult medical examination without abnormal findings: Secondary | ICD-10-CM

## 2015-06-02 DIAGNOSIS — R918 Other nonspecific abnormal finding of lung field: Secondary | ICD-10-CM | POA: Insufficient documentation

## 2015-06-02 MED ORDER — BUPROPION HCL ER (XL) 150 MG PO TB24: 150.0000 mg | ORAL_TABLET | Freq: Every day | ORAL | Status: DC

## 2015-06-02 MED ORDER — ESCITALOPRAM OXALATE 20 MG PO TABS: ORAL_TABLET | ORAL | Status: DC

## 2015-06-02 NOTE — Progress Notes (Signed)
Pre visit review using our clinic review tool, if applicable. No additional management support is needed unless otherwise documented below in the visit note. 

## 2015-06-02 NOTE — Progress Notes (Signed)
   Subjective:    Patient ID: Samuel Richmond, male    DOB: 22-Apr-1956, 59 y.o.   MRN: [id number]  HPI CPE- UTD on colonoscopy, urology.  No concerns today.   Review of Systems Patient reports no vision/hearing changes, anorexia, fever ,adenopathy, persistant/recurrent hoarseness, swallowing issues, chest pain, palpitations, edema, persistant/recurrent cough, hemoptysis, dyspnea (rest,exertional, paroxysmal nocturnal), gastrointestinal  bleeding (melena, rectal bleeding), abdominal pain, excessive heart burn, GU symptoms (dysuria, hematuria, voiding/incontinence issues) syncope, focal weakness, memory loss, numbness & tingling, skin/hair/nail changes, depression, anxiety, abnormal bruising/bleeding, musculoskeletal symptoms/signs.     Objective:   Physical Exam General Appearance:    Alert, cooperative, no distress, appears stated age  Head:    Normocephalic, without obvious abnormality, atraumatic  Eyes:    PERRL, conjunctiva/corneas clear, EOM's intact, fundi    benign, both eyes       Ears:    Normal TM's and external ear canals, both ears  Nose:   Nares normal, septum midline, mucosa normal, no drainage   or sinus tenderness  Throat:   Lips, mucosa, and tongue normal; teeth and gums normal  Neck:   Supple, symmetrical, trachea midline, no adenopathy;       thyroid:  No enlargement/tenderness/nodules  Back:     Symmetric, no curvature, ROM normal, no CVA tenderness  Lungs:     Clear to auscultation bilaterally, respirations unlabored  Chest wall:    No tenderness or deformity  Heart:    Regular rate and rhythm, S1 and S2 normal, no murmur, rub   or gallop  Abdomen:     Soft, non-tender, bowel sounds active all four quadrants,    no masses, no organomegaly  Genitalia:    Deferred to urology  Rectal:    Extremities:   Extremities normal, atraumatic, no cyanosis or edema  Pulses:   2+ and symmetric all extremities  Skin:   Skin color, texture, turgor normal, no rashes or lesions    Lymph nodes:   Cervical, supraclavicular, and axillary nodes normal  Neurologic:   CNII-XII intact. Normal strength, sensation and reflexes      throughout          Assessment & Plan:

## 2015-06-02 NOTE — Assessment & Plan Note (Signed)
New to provider.  Pt had CT scan done at [facility] in 2008 that showed scattered bilateral pulmonary nodules and recommended 1 yr f/u.  Pt never followed through and just recently remembered this recommendation.  Will order f/u CT scan.  Pt expressed understanding and is in agreement w/ plan.

## 2015-06-02 NOTE — Assessment & Plan Note (Signed)
Pt's PE WNL.  UTD on colonoscopy, urology.  Check labs.  Anticipatory guidance provided.  

## 2015-06-02 NOTE — Patient Instructions (Signed)
Follow up in 1 year or as needed Your lab work looked great!  Keep up the good work! We'll call you with your CT appt Call with any questions or concerns Happy 4th Of July!!!  Enjoy the beach!

## 2015-06-03 ENCOUNTER — Encounter: Payer: Self-pay | Admitting: General Practice

## 2015-06-07 ENCOUNTER — Encounter: Payer: Self-pay | Admitting: Family Medicine

## 2015-06-07 DIAGNOSIS — M25579 Pain in unspecified ankle and joints of unspecified foot: Secondary | ICD-10-CM

## 2015-06-08 ENCOUNTER — Ambulatory Visit (HOSPITAL_BASED_OUTPATIENT_CLINIC_OR_DEPARTMENT_OTHER)
Admission: RE | Admit: 2015-06-08 | Discharge: 2015-06-08 | Disposition: A | Discharge status: Home / Self Care | Payer: BLUE CROSS/BLUE SHIELD | Source: Ambulatory Visit | Attending: Family Medicine | Admitting: Family Medicine

## 2015-06-08 DIAGNOSIS — I2510 Atherosclerotic heart disease of native coronary artery without angina pectoris: Secondary | ICD-10-CM | POA: Insufficient documentation

## 2015-06-08 DIAGNOSIS — R918 Other nonspecific abnormal finding of lung field: Secondary | ICD-10-CM | POA: Diagnosis not present

## 2015-06-20 ENCOUNTER — Telehealth: Payer: Self-pay | Admitting: Family Medicine

## 2015-06-20 ENCOUNTER — Other Ambulatory Visit: Payer: Self-pay | Admitting: Family Medicine

## 2015-06-20 DIAGNOSIS — I2510 Atherosclerotic heart disease of native coronary artery without angina pectoris: Secondary | ICD-10-CM

## 2015-06-20 NOTE — Telephone Encounter (Signed)
Caller name: [place] Relation to pt: Call back number: [phone] Pharmacy:  Reason for call:   Is wanting to discuss last CT

## 2015-06-20 NOTE — Telephone Encounter (Signed)
Spoke with pt, and advised on CT results. Pt stated he wanted to go ahead with cards referral. Placed today.

## 2015-06-23 ENCOUNTER — Ambulatory Visit (INDEPENDENT_AMBULATORY_CARE_PROVIDER_SITE_OTHER): Payer: BLUE CROSS/BLUE SHIELD | Admitting: Cardiovascular Disease

## 2015-06-23 ENCOUNTER — Encounter: Payer: Self-pay | Admitting: Cardiovascular Disease

## 2015-06-23 VITALS — BP 104/72 | HR 52 | Ht 72.0 in | Wt 180.2 lb

## 2015-06-23 DIAGNOSIS — I2510 Atherosclerotic heart disease of native coronary artery without angina pectoris: Secondary | ICD-10-CM | POA: Diagnosis not present

## 2015-06-23 NOTE — Patient Instructions (Signed)
Follow up as needed

## 2015-06-23 NOTE — Assessment & Plan Note (Signed)
Samuel Richmond had coronary artery calcification noted on CT scanning performed 06/18/15. This was a screening CT scan read by Dr. Weber Cooks . His coronary vessels the risk factor profile is notable for 50-pack-years of tobacco abuse having stopped 16 years ago but otherwise is negative. He has never had a heart attack or stroke. He denies chest pain or shortness of breath. At this point, I do not feel compelled to perform functional's testing since he is completely asymptomatic. I will see back when necessary. Should he have any symptoms that would have a low threshold to perform functional testing and/or more invasive evaluation.

## 2015-06-23 NOTE — Progress Notes (Signed)
06/23/2015 BRET VANESSEN   1956/10/25  [id number]  Primary Physician Annye Asa, MD Primary Cardiologist: Lorretta Harp MD Renae Gloss   HPI:  Mr. Stuard is a delightful 59 year old married Caucasian male father of 2 children referred by Dr. Genice Rouge for cardiovascular evaluation. He is a Freight forwarder at [employer] . He had a recent screening chest CT performed on 06/18/15 for follow-up of remotely documented nodules that showed coronary calcification 2 arteries. His cardiovascular risk factor profile is notable for 20-pack-years of tobacco abuse having quit in 2000. There is no family history for heart disease. He has never had a heart attack or stroke. He denies chest pain or shortness of breath. He has had prostate cancer in the past surgically removed and treated with radiation therapy.   Current Outpatient Prescriptions  Medication Sig Dispense Refill  . buPROPion (WELLBUTRIN XL) 150 MG 24 hr tablet Take 1 tablet (150 mg total) by mouth daily. 90 tablet 1  . escitalopram (LEXAPRO) 20 MG tablet TAKE 1 TABLET (20 MG TOTAL) BY MOUTH DAILY. 90 tablet 1  . meloxicam (MOBIC) 15 MG tablet Take 1 tablet by mouth daily as needed.  1  . Omega-3 Fatty Acids (OMEGA 3 PO) Take 2,400 mg by mouth daily.    [name] omeprazole (PRILOSEC) 20 MG capsule Take 1 capsule (20 mg total) by mouth daily. As needed 30 capsule 3  . Sildenafil Citrate (VIAGRA PO) Take by mouth.    . Tiotropium Bromide Monohydrate 1.25 MCG/ACT AERS Inhale 2 puffs into the lungs daily as needed.     No current facility-administered medications for this visit.    Allergies  Allergen Reactions  . Levaquin [Levofloxacin] Other (See Comments)    Cramps in legs, chills    History   Social History  . Marital Status: Married    Spouse Name: N/A  . Number of Children: 1  . Years of Education: N/A   Occupational History  .  [employer]   Social History Main Topics  . Smoking status: Former Smoker -- 1.00 packs/day for 20  years    Quit date: 12/03/1998  . Smokeless tobacco: Never Used  . Alcohol Use: No  . Drug Use: No  . Sexual Activity: Not on file   Other Topics Concern  . Not on file   Social History Narrative   Pt lives at home with wife 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.Pt has had some college     Review of Systems: General: negative for chills, fever, night sweats or weight changes.  Cardiovascular: negative for chest pain, dyspnea on exertion, edema, orthopnea, palpitations, paroxysmal nocturnal dyspnea or shortness of breath Dermatological: negative for rash Respiratory: negative for cough or wheezing Urologic: negative for hematuria Abdominal: negative for nausea, vomiting, diarrhea, bright red blood per rectum, melena, or hematemesis Neurologic: negative for visual changes, syncope, or dizziness All other systems reviewed and are otherwise negative except as noted above.    Blood pressure 104/72, pulse 52, height 6' (1.829 m), weight 180 lb 3.2 oz (81.738 kg).  General appearance: alert and no distress Neck: no adenopathy, no carotid bruit, no JVD, supple, symmetrical, trachea midline and thyroid not enlarged, symmetric, no tenderness/mass/nodules Lungs: clear to auscultation bilaterally Heart: regular rate and rhythm, S1, S2 normal, no murmur, click, rub or gallop Extremities: extremities normal, atraumatic, no cyanosis or edema  EKG sinus bradycardia 52 without ST or T-wave changes. I personally reviewed this EKG  ASSESSMENT AND PLAN:  Coronary artery calcification seen on CAT scan Mr. Wichmann had coronary artery calcification noted on CT scanning performed 06/18/15. This was a screening CT scan read by Dr. Weber Cooks . His coronary vessels the risk factor profile is notable for 50-pack-years of tobacco abuse having stopped 16 years ago but otherwise is negative. He has never had a heart attack or stroke. He denies chest pain or shortness of breath. At this point, I do not  feel compelled to perform functional's testing since he is completely asymptomatic. I will see back when necessary. Should he have any symptoms that would have a low threshold to perform functional testing and/or more invasive evaluation.      Lorretta Harp MD FACP,FACC,FAHA, [facility] 06/23/2015 12:20 PM

## 2015-06-28 ENCOUNTER — Encounter: Payer: Self-pay | Admitting: Cardiovascular Disease

## 2015-07-04 ENCOUNTER — Encounter: Payer: Self-pay | Admitting: General Practice

## 2015-07-04 ENCOUNTER — Encounter: Payer: Self-pay | Admitting: Family Medicine

## 2015-07-04 NOTE — Telephone Encounter (Signed)
He needs to be seen by one of [place] so we have it documented. --- surgeons want office notes

## 2015-07-04 NOTE — Telephone Encounter (Signed)
Pt last OV 06/23/2015 (CPE) Updated chart to reflect past history with hernia repairs. Ok to place referral or does pt need face-to-face first for evaluation?

## 2015-07-05 ENCOUNTER — Encounter: Payer: Self-pay | Admitting: Family

## 2015-07-05 ENCOUNTER — Ambulatory Visit (INDEPENDENT_AMBULATORY_CARE_PROVIDER_SITE_OTHER): Payer: BLUE CROSS/BLUE SHIELD | Admitting: Family

## 2015-07-05 VITALS — BP 122/80 | HR 60 | Temp 98.1°F | Resp 16 | Ht 71.75 in | Wt 181.2 lb

## 2015-07-05 DIAGNOSIS — K4090 Unilateral inguinal hernia, without obstruction or gangrene, not specified as recurrent: Secondary | ICD-10-CM | POA: Diagnosis not present

## 2015-07-05 NOTE — Progress Notes (Signed)
Subjective:    Patient ID: Samuel Richmond, male    DOB: 1956/01/09, 59 y.o.   MRN: [id number]  HPI  Samuel Richmond is a 59 yr old male who presents today with complaint of left inguinal swelling. Has mild associated discomfort.   Denies associate nausea/vomitting. No known injury.  Noticed several weeks ago.  Hx of RIH repair.      Review of Systems    see HPI  Past Medical History  Diagnosis Date  . Asthma   . Attention deficit hyperactivity disorder (ADHD)   . Dyslipidemia   . Esophageal reflux   . Elevated PSA 11/04/2012    5.33  . COPD (chronic obstructive pulmonary disease)   . Arthritis   . Benign prostatic hypertrophy   . 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 Prostate cancer 02/10/13    gleason 6 (3+3) and 7 (4+3) on right side, volume 28.87 cc  . Depression   . Anxiety   . S/P radiation therapy 12/15/2014 through 03/01/201601/13/2016 through 02/01/2015      Prostate bed 6600 cGy in 33 sessions   . Coronary artery calcification seen on CAT scan   . Hx of hernia repair     double repair at 79 days old  . Left groin hernia     History   Social History  . Marital Status: Married    Spouse Name: N/A  . Number of Children: 1  . Years of Education: N/A   Occupational History  .  [employer]   Social History Main Topics  . Smoking status: Former Smoker -- 1.00 packs/day for 20 years    Quit date: 12/03/1998  . Smokeless tobacco: Never Used  . Alcohol Use: No  . Drug Use: No  . Sexual Activity: Not on file   Other Topics Concern  . Not on file   Social History Narrative   Pt lives at home with wife 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.Pt has had some college    Past Surgical History  Procedure Laterality Date  . Trigger finger release  09/04/2012    Procedure: MINOR RELEASE TRIGGER FINGER/A-1 PULLEY;  Surgeon:  Cammie Sickle., MD;  Location: [place];  Service: Orthopedics;  Laterality: Left;  left long and cyst excision left long  . Knee surgery      x 5  . Nasal sinus surgery    . Robot assisted laparoscopic radical prostatectomy Bilateral 03/26/2013    Procedure: ROBOTIC ASSISTED LAPAROSCOPIC RADICAL PROSTATECTOMY LEVEL 2;  Surgeon: Dutch Gray, MD;  Location: [facility];  Service: Urology;  Laterality: Bilateral;  3 HRS   . Lymphadenectomy Bilateral 03/26/2013    Procedure: LYMPHADENECTOMY;  Surgeon: Dutch Gray, MD;  Location: [facility];  Service: Urology;  Laterality: Bilateral;  . Total knee arthroplasty Left 07/15/2013    Dr Percell Miller  . Total knee arthroplasty Left 07/15/2013    Procedure: TOTAL KNEE ARTHROPLASTY;  Surgeon: Ninetta Lights, MD;  Location: [place];  Service: Orthopedics;  Laterality: Left;  . Joint replacement Left 2014  . Trigger finger release Bilateral 12/09/2014    Procedure: RELEASE A-1 PULLEY RIGHT MIDDLE FINGER,RELEASE A-1 PULLEY LEFT RING FINGER;  Surgeon: Daryll Brod, MD;  Location: [facility];  Service: Orthopedics;  Laterality: Bilateral;  . Hernia repair      bilateral as infant. Right inguinal hernia 2006.    Family History  Problem Relation Age of Onset  . Prostate  cancer Father   . Rheumatic fever Paternal Grandmother   . Hyperlipidemia Sister     Allergies  Allergen Reactions  . Levaquin [Levofloxacin] Other (See Comments)    Cramps in legs, chills    Current Outpatient Prescriptions on File Prior to Visit  Medication Sig Dispense Refill  . buPROPion (WELLBUTRIN XL) 150 MG 24 hr tablet Take 1 tablet (150 mg total) by mouth daily. 90 tablet 1  . escitalopram (LEXAPRO) 20 MG tablet TAKE 1 TABLET (20 MG TOTAL) BY MOUTH DAILY. 90 tablet 1  . meloxicam (MOBIC) 15 MG tablet Take 1 tablet by mouth daily as needed.  1  . Omega-3 Fatty Acids (OMEGA 3 PO) Take 2,400 mg by mouth daily.    [name] omeprazole (PRILOSEC) 20 MG capsule Take 1  capsule (20 mg total) by mouth daily. As needed 30 capsule 3  . Sildenafil Citrate (VIAGRA PO) Take by mouth.    . Tiotropium Bromide Monohydrate 1.25 MCG/ACT AERS Inhale 2 puffs into the lungs daily as needed.     No current facility-administered medications on file prior to visit.    BP 122/80 mmHg  Pulse 60  Temp(Src) 98.1 F (36.7 C) (Oral)  Resp 16  Ht 5' 11.75" (1.822 m)  Wt 181 lb 3.2 oz (82.192 kg)  BMI 24.76 kg/m2  SpO2 99%    Objective:   Physical Exam  Constitutional: Samuel Richmond is oriented to person, place, and time. Samuel Richmond appears well-developed and well-nourished. No distress.  Abdominal: Soft.  + LIH- protrudes with valsalva, easily reducible  Neurological: Samuel Richmond is alert and oriented to person, place, and time.  Skin: Skin is warm and dry.  Psychiatric: Samuel Richmond has a normal mood and affect. His behavior is normal. Judgment and thought content normal.          Assessment & Plan:  [facility]- new, refer for surgical repair. Advised pt on symptoms of incarcerated hernia and advised him to go to the ED if these occur.

## 2015-07-05 NOTE — Patient Instructions (Signed)
You will be contacted about your referral to see the surgeon. Please let me know if you have not heard back in 1 week about the referral.  Inguinal Hernia, Adult Muscles help keep everything in the body in its proper place. But if a weak spot in the muscles develops, something can poke through. That is called a hernia. When this happens in the lower part of the belly (abdomen), it is called an inguinal hernia. (It takes its name from a part of the body in this region called the inguinal canal.) A weak spot in the wall of muscles lets some fat or part of the small intestine bulge through. An inguinal hernia can develop at any age. Men get them more often than women. CAUSES  In adults, an inguinal hernia develops over time.  It can be triggered by:  Suddenly straining the muscles of the lower abdomen.  Lifting heavy objects.  Straining to have a bowel movement. Difficult bowel movements (constipation) can lead to this.  Constant coughing. This may be caused by smoking or lung disease.  Being overweight.  Being pregnant.  Working at a job that requires long periods of standing or heavy lifting.  Having had an inguinal hernia before. One type can be an emergency situation. It is called a strangulated inguinal hernia. It develops if part of the small intestine slips through the weak spot and cannot get back into the abdomen. The blood supply can be cut off. If that happens, part of the intestine may die. This situation requires emergency surgery. SYMPTOMS  Often, a small inguinal hernia has no symptoms. It is found when a healthcare provider does a physical exam. Larger hernias usually have symptoms.   In adults, symptoms may include:  A lump in the groin. This is easier to see when the person is standing. It might disappear when lying down.  In men, a lump in the scrotum.  Pain or burning in the groin. This occurs especially when lifting, straining or coughing.  A dull ache or  feeling of pressure in the groin.  Signs of a strangulated hernia can include:  A bulge in the groin that becomes very painful and tender to the touch.  A bulge that turns red or purple.  Fever, nausea and vomiting.  Inability to have a bowel movement or to pass gas. DIAGNOSIS  To decide if you have an inguinal hernia, a healthcare provider will probably do a physical examination.  This will include asking questions about any symptoms you have noticed.  The healthcare provider might feel the groin area and ask you to cough. If an inguinal hernia is felt, the healthcare provider may try to slide it back into the abdomen.  Usually no other tests are needed. TREATMENT  Treatments can vary. The size of the hernia makes a difference. Options include:  Watchful waiting. This is often suggested if the hernia is small and you have had no symptoms.  No medical procedure will be done unless symptoms develop.  You will need to watch closely for symptoms. If any occur, contact your healthcare provider right away.  Surgery. This is used if the hernia is larger or you have symptoms.  Open surgery. This is usually an outpatient procedure (you will not stay overnight in a hospital). An cut (incision) is made through the skin in the groin. The hernia is put back inside the abdomen. The weak area in the muscles is then repaired by herniorrhaphy or hernioplasty. Herniorrhaphy: in this  type of surgery, the weak muscles are sewn back together. Hernioplasty: a patch or mesh is used to close the weak area in the abdominal wall.  Laparoscopy. In this procedure, a surgeon makes small incisions. A thin tube with a tiny video camera (called a laparoscope) is put into the abdomen. The surgeon repairs the hernia with mesh by looking with the video camera and using two long instruments. HOME CARE INSTRUCTIONS   After surgery to repair an inguinal hernia:  You will need to take pain medicine prescribed by your  healthcare provider. Follow all directions carefully.  You will need to take care of the wound from the incision.  Your activity will be restricted for awhile. This will probably include no heavy lifting for several weeks. You also should not do anything too active for a few weeks. When you can return to work will depend on the type of job that you have.  During "watchful waiting" periods, you should:  Maintain a healthy weight.  Eat a diet high in fiber (fruits, vegetables and whole grains).  Drink plenty of fluids to avoid constipation. This means drinking enough water and other liquids to keep your urine clear or pale yellow.  Do not lift heavy objects.  Do not stand for long periods of time.  Quit smoking. This should keep you from developing a frequent cough. SEEK MEDICAL CARE IF:   A bulge develops in your groin area.  You feel pain, a burning sensation or pressure in the groin. This might be worse if you are lifting or straining.  You develop a fever of more than 100.5 F (38.1 C). SEEK IMMEDIATE MEDICAL CARE IF:   Pain in the groin increases suddenly.  A bulge in the groin gets bigger suddenly and does not go down.  For men, there is sudden pain in the scrotum. Or, the size of the scrotum increases.  A bulge in the groin area becomes red or purple and is painful to touch.  You have nausea or vomiting that does not go away.  You feel your heart beating much faster than normal.  You cannot have a bowel movement or pass gas.  You develop a fever of more than 102.0 F (38.9 C). Document Released: 04/07/2009 Document Revised: 02/11/2012 Document Reviewed: 04/07/2009 [facility] Patient Information 2015 [place], [place]. This information is not intended to replace advice given to you by your health care provider. Make sure you discuss any questions you have with your health care provider.

## 2015-07-05 NOTE — Progress Notes (Signed)
Pre visit review using our clinic review tool, if applicable. No additional management support is needed unless otherwise documented below in the visit note. 

## 2015-09-03 DIAGNOSIS — M6530 Trigger finger, unspecified finger: Secondary | ICD-10-CM

## 2015-09-03 HISTORY — DX: Trigger finger, unspecified finger: M65.30

## 2015-09-09 ENCOUNTER — Other Ambulatory Visit: Payer: Self-pay | Admitting: Orthopedic Surgery

## 2015-09-26 ENCOUNTER — Encounter (HOSPITAL_BASED_OUTPATIENT_CLINIC_OR_DEPARTMENT_OTHER): Payer: Self-pay | Admitting: *Deleted

## 2015-09-30 ENCOUNTER — Ambulatory Visit (HOSPITAL_BASED_OUTPATIENT_CLINIC_OR_DEPARTMENT_OTHER)
Admission: RE | Admit: 2015-09-30 | Discharge: 2015-09-30 | Disposition: A | Discharge status: Home / Self Care | Payer: BLUE CROSS/BLUE SHIELD | Source: Ambulatory Visit | Attending: Orthopedic Surgery | Admitting: Orthopedic Surgery

## 2015-09-30 ENCOUNTER — Ambulatory Visit (HOSPITAL_BASED_OUTPATIENT_CLINIC_OR_DEPARTMENT_OTHER): Payer: BLUE CROSS/BLUE SHIELD | Admitting: Anesthesiology

## 2015-09-30 ENCOUNTER — Encounter (HOSPITAL_BASED_OUTPATIENT_CLINIC_OR_DEPARTMENT_OTHER)
Admission: RE | Disposition: A | Discharge status: Home / Self Care | Payer: Self-pay | Source: Ambulatory Visit | Attending: Orthopedic Surgery

## 2015-09-30 ENCOUNTER — Encounter (HOSPITAL_BASED_OUTPATIENT_CLINIC_OR_DEPARTMENT_OTHER): Payer: Self-pay | Admitting: Anesthesiology

## 2015-09-30 DIAGNOSIS — Z881 Allergy status to other antibiotic agents status: Secondary | ICD-10-CM | POA: Insufficient documentation

## 2015-09-30 DIAGNOSIS — E785 Hyperlipidemia, unspecified: Secondary | ICD-10-CM | POA: Diagnosis not present

## 2015-09-30 DIAGNOSIS — J45909 Unspecified asthma, uncomplicated: Secondary | ICD-10-CM | POA: Insufficient documentation

## 2015-09-30 DIAGNOSIS — Z87891 Personal history of nicotine dependence: Secondary | ICD-10-CM | POA: Diagnosis not present

## 2015-09-30 DIAGNOSIS — K219 Gastro-esophageal reflux disease without esophagitis: Secondary | ICD-10-CM | POA: Insufficient documentation

## 2015-09-30 DIAGNOSIS — M65841 Other synovitis and tenosynovitis, right hand: Secondary | ICD-10-CM | POA: Insufficient documentation

## 2015-09-30 DIAGNOSIS — M65322 Trigger finger, left index finger: Secondary | ICD-10-CM | POA: Diagnosis not present

## 2015-09-30 DIAGNOSIS — J449 Chronic obstructive pulmonary disease, unspecified: Secondary | ICD-10-CM | POA: Insufficient documentation

## 2015-09-30 DIAGNOSIS — M65321 Trigger finger, right index finger: Secondary | ICD-10-CM | POA: Insufficient documentation

## 2015-09-30 DIAGNOSIS — F329 Major depressive disorder, single episode, unspecified: Secondary | ICD-10-CM | POA: Diagnosis not present

## 2015-09-30 DIAGNOSIS — F419 Anxiety disorder, unspecified: Secondary | ICD-10-CM | POA: Insufficient documentation

## 2015-09-30 DIAGNOSIS — Z8546 Personal history of malignant neoplasm of prostate: Secondary | ICD-10-CM | POA: Diagnosis not present

## 2015-09-30 HISTORY — DX: Trigger finger, unspecified finger: M65.30

## 2015-09-30 HISTORY — PX: TRIGGER FINGER RELEASE: SHX641

## 2015-09-30 HISTORY — DX: Other nonspecific abnormal finding of lung field: R91.8

## 2015-09-30 HISTORY — DX: Gastro-esophageal reflux disease without esophagitis: K21.9

## 2015-09-30 HISTORY — DX: Personal history of malignant neoplasm of prostate: Z85.46

## 2015-09-30 HISTORY — DX: Dental restoration status: Z98.811

## 2015-09-30 SURGERY — RELEASE, A1 PULLEY, FOR TRIGGER FINGER
Anesthesia: Regional | Site: Hand | Laterality: Bilateral

## 2015-09-30 MED ORDER — MIDAZOLAM HCL 2 MG/2ML IJ SOLN: 1.0000 mg | INTRAMUSCULAR | Status: DC | PRN

## 2015-09-30 MED ORDER — DEXAMETHASONE SODIUM PHOSPHATE 10 MG/ML IJ SOLN
INTRAMUSCULAR | Status: DC | PRN
  Administered 2015-09-30: 10 mg via INTRAVENOUS

## 2015-09-30 MED ORDER — GLYCOPYRROLATE 0.2 MG/ML IJ SOLN
INTRAMUSCULAR | Status: DC | PRN
  Administered 2015-09-30: 0.2 mg via INTRAVENOUS

## 2015-09-30 MED ORDER — PROPOFOL 10 MG/ML IV BOLUS
INTRAVENOUS | Status: AC
  Filled 2015-09-30: qty 20

## 2015-09-30 MED ORDER — GLYCOPYRROLATE 0.2 MG/ML IJ SOLN
INTRAMUSCULAR | Status: AC
  Filled 2015-09-30: qty 1

## 2015-09-30 MED ORDER — MEPERIDINE HCL 25 MG/ML IJ SOLN: 6.2500 mg | INTRAMUSCULAR | Status: DC | PRN

## 2015-09-30 MED ORDER — DEXAMETHASONE SODIUM PHOSPHATE 10 MG/ML IJ SOLN
INTRAMUSCULAR | Status: AC
  Filled 2015-09-30: qty 1

## 2015-09-30 MED ORDER — GLYCOPYRROLATE 0.2 MG/ML IJ SOLN
0.2000 mg | Freq: Once | INTRAMUSCULAR | Status: DC | PRN
Start: 2015-09-30 — End: 2015-09-30

## 2015-09-30 MED ORDER — LIDOCAINE HCL (CARDIAC) 20 MG/ML IV SOLN
INTRAVENOUS | Status: DC | PRN
  Administered 2015-09-30: 50 mg via INTRAVENOUS

## 2015-09-30 MED ORDER — CHLORHEXIDINE GLUCONATE 4 % EX LIQD: 60.0000 mL | Freq: Once | CUTANEOUS | Status: DC

## 2015-09-30 MED ORDER — LIDOCAINE HCL (CARDIAC) 20 MG/ML IV SOLN
INTRAVENOUS | Status: AC
  Filled 2015-09-30: qty 5

## 2015-09-30 MED ORDER — HYDROCODONE-ACETAMINOPHEN 5-325 MG PO TABS: 1.0000 | ORAL_TABLET | Freq: Four times a day (QID) | ORAL | Status: DC | PRN

## 2015-09-30 MED ORDER — ONDANSETRON HCL 4 MG/2ML IJ SOLN: 4.0000 mg | Freq: Once | INTRAMUSCULAR | Status: DC | PRN

## 2015-09-30 MED ORDER — SCOPOLAMINE 1 MG/3DAYS TD PT72: 1.0000 | MEDICATED_PATCH | Freq: Once | TRANSDERMAL | Status: DC | PRN

## 2015-09-30 MED ORDER — LACTATED RINGERS IV SOLN
INTRAVENOUS | Status: DC
  Administered 2015-09-30 (×2): via INTRAVENOUS

## 2015-09-30 MED ORDER — KETOROLAC TROMETHAMINE 30 MG/ML IJ SOLN
INTRAMUSCULAR | Status: AC
  Filled 2015-09-30: qty 1

## 2015-09-30 MED ORDER — HYDROMORPHONE HCL 1 MG/ML IJ SOLN: 0.2500 mg | INTRAMUSCULAR | Status: DC | PRN

## 2015-09-30 MED ORDER — FENTANYL CITRATE (PF) 100 MCG/2ML IJ SOLN
INTRAMUSCULAR | Status: DC | PRN
  Administered 2015-09-30: 100 ug via INTRAVENOUS

## 2015-09-30 MED ORDER — ONDANSETRON HCL 4 MG/2ML IJ SOLN
INTRAMUSCULAR | Status: DC | PRN
  Administered 2015-09-30: 4 mg via INTRAVENOUS

## 2015-09-30 MED ORDER — CEFAZOLIN SODIUM-DEXTROSE 2-3 GM-% IV SOLR
INTRAVENOUS | Status: AC
  Filled 2015-09-30: qty 50

## 2015-09-30 MED ORDER — CEFAZOLIN SODIUM-DEXTROSE 2-3 GM-% IV SOLR: 2.0000 g | INTRAVENOUS | Status: DC

## 2015-09-30 MED ORDER — PROPOFOL 10 MG/ML IV BOLUS
INTRAVENOUS | Status: DC | PRN
  Administered 2015-09-30: 100 mg via INTRAVENOUS
  Administered 2015-09-30: 30 mg via INTRAVENOUS
  Administered 2015-09-30: 200 mg via INTRAVENOUS

## 2015-09-30 MED ORDER — LIDOCAINE HCL (PF) 0.5 % IJ SOLN
INTRAMUSCULAR | Status: DC | PRN
  Administered 2015-09-30: 50 mL via INTRAVENOUS

## 2015-09-30 MED ORDER — MIDAZOLAM HCL 5 MG/5ML IJ SOLN
INTRAMUSCULAR | Status: DC | PRN
  Administered 2015-09-30: 2 mg via INTRAVENOUS

## 2015-09-30 MED ORDER — ONDANSETRON HCL 4 MG/2ML IJ SOLN
INTRAMUSCULAR | Status: AC
  Filled 2015-09-30: qty 2

## 2015-09-30 MED ORDER — FENTANYL CITRATE (PF) 100 MCG/2ML IJ SOLN
50.0000 ug | INTRAMUSCULAR | Status: DC | PRN
Start: 2015-09-30 — End: 2015-09-30

## 2015-09-30 MED ORDER — BUPIVACAINE HCL (PF) 0.25 % IJ SOLN
INTRAMUSCULAR | Status: DC | PRN
  Administered 2015-09-30: 4 mL

## 2015-09-30 MED ORDER — CEFAZOLIN SODIUM-DEXTROSE 2-3 GM-% IV SOLR
2.0000 g | INTRAVENOUS | Status: AC
  Administered 2015-09-30: 2 g via INTRAVENOUS

## 2015-09-30 SURGICAL SUPPLY — 35 items
BANDAGE COBAN STERILE 2 (GAUZE/BANDAGES/DRESSINGS) ×2 IMPLANT
BLADE SURG 15 STRL LF DISP TIS (BLADE) ×1 IMPLANT
BLADE SURG 15 STRL SS (BLADE) ×2
BNDG CMPR 9X4 STRL LF SNTH (GAUZE/BANDAGES/DRESSINGS)
BNDG ESMARK 4X9 LF (GAUZE/BANDAGES/DRESSINGS) IMPLANT
CHLORAPREP W/TINT 26ML (MISCELLANEOUS) ×3 IMPLANT
CORDS BIPOLAR (ELECTRODE) ×1 IMPLANT
COVER BACK TABLE 60X90IN (DRAPES) ×2 IMPLANT
COVER MAYO STAND STRL (DRAPES) ×3 IMPLANT
CUFF TOURNIQUET SINGLE 18IN (TOURNIQUET CUFF) ×2 IMPLANT
DECANTER SPIKE VIAL GLASS SM (MISCELLANEOUS) IMPLANT
DRAPE EXTREMITY T 121X128X90 (DRAPE) ×3 IMPLANT
DRAPE SURG 17X23 STRL (DRAPES) ×3 IMPLANT
GAUZE SPONGE 4X4 12PLY STRL (GAUZE/BANDAGES/DRESSINGS) ×2 IMPLANT
GAUZE XEROFORM 1X8 LF (GAUZE/BANDAGES/DRESSINGS) ×2 IMPLANT
GLOVE BIO SURGEON STRL SZ 6.5 (GLOVE) ×1 IMPLANT
GLOVE BIOGEL PI IND STRL 7.0 (GLOVE) IMPLANT
GLOVE BIOGEL PI IND STRL 8.5 (GLOVE) ×1 IMPLANT
GLOVE BIOGEL PI INDICATOR 7.0 (GLOVE) ×2
GLOVE BIOGEL PI INDICATOR 8.5 (GLOVE) ×1
GLOVE SURG ORTHO 8.0 STRL STRW (GLOVE) ×2 IMPLANT
GOWN STRL REUS W/ TWL LRG LVL3 (GOWN DISPOSABLE) ×1 IMPLANT
GOWN STRL REUS W/TWL LRG LVL3 (GOWN DISPOSABLE) ×4
GOWN STRL REUS W/TWL XL LVL3 (GOWN DISPOSABLE) ×2 IMPLANT
NDL PRECISIONGLIDE 27X1.5 (NEEDLE) ×1 IMPLANT
NEEDLE PRECISIONGLIDE 27X1.5 (NEEDLE) ×4 IMPLANT
NS IRRIG 1000ML POUR BTL (IV SOLUTION) ×2 IMPLANT
PACK BASIN DAY SURGERY FS (CUSTOM PROCEDURE TRAY) ×2 IMPLANT
STOCKINETTE 4X48 STRL (DRAPES) ×3 IMPLANT
SUT ETHILON 4 0 PS 2 18 (SUTURE) ×3 IMPLANT
SUT MON AB 5-0 PS2 18 (SUTURE) ×2 IMPLANT
SYR BULB 3OZ (MISCELLANEOUS) ×2 IMPLANT
SYR CONTROL 10ML LL (SYRINGE) ×2 IMPLANT
TOWEL OR 17X24 6PK STRL BLUE (TOWEL DISPOSABLE) ×4 IMPLANT
UNDERPAD 30X30 (UNDERPADS AND DIAPERS) ×2 IMPLANT

## 2015-09-30 NOTE — Anesthesia Preprocedure Evaluation (Signed)
Anesthesia Evaluation  Patient identified by MRN, date of birth, ID band Patient awake    Reviewed: Allergy & Precautions, NPO status , Patient's Chart, lab work & pertinent test results  Airway Mallampati: I  TM Distance: >3 FB Neck ROM: Full    Dental   Pulmonary asthma , former smoker,    Pulmonary exam normal        Cardiovascular Normal cardiovascular exam     Neuro/Psych Anxiety Depression    GI/Hepatic GERD  Medicated and Controlled,  Endo/Other    Renal/GU      Musculoskeletal   Abdominal   Peds  Hematology   Anesthesia Other Findings   Reproductive/Obstetrics                             Anesthesia Physical Anesthesia Plan  ASA: II  Anesthesia Plan: Bier Block   Post-op Pain Management:    Induction: Intravenous  Airway Management Planned: Natural Airway  Additional Equipment:   Intra-op Plan:   Post-operative Plan:   Informed Consent: I have reviewed the patients History and Physical, chart, labs and discussed the procedure including the risks, benefits and alternatives for the proposed anesthesia with the patient or authorized representative who has indicated his/her understanding and acceptance.     Plan Discussed with: CRNA and Surgeon  Anesthesia Plan Comments:         Anesthesia Quick Evaluation

## 2015-09-30 NOTE — Transfer of Care (Signed)
Immediate Anesthesia Transfer of Care Note  Patient: Samuel Richmond  Procedure(s) Performed: Procedure(s): RELEASE TRIGGER FINGER/A-1 PULLEY BILATERAL INDEX FINGERS (Bilateral)  Patient Location: PACU  Anesthesia Type:General  Level of Consciousness: awake and patient cooperative  Airway & Oxygen Therapy: Patient Spontanous Breathing and Patient connected to face mask oxygen  Post-op Assessment: Report given to RN and Post -op Vital signs reviewed and stable  Post vital signs: Reviewed and stable  Last Vitals:  Filed Vitals:   09/30/15 1230  BP: 122/78  Pulse: 64  Temp: 36.8 C  Resp: 20    Complications: No apparent anesthesia complications

## 2015-09-30 NOTE — Op Note (Signed)
Dictation Number [id number]

## 2015-09-30 NOTE — H&P (Signed)
Samuel Richmond is a 59 year-old right-hand dominant male complaining of catching of his hands following injection of bilateral index finger stenosing tenosynovitis.  He underwent injections to the left and right index fingers 05/09/15.  [name]  He has had trigger fingers released in the past, slowly had Dupuytren's, he is of [place] descent.  He does have nodules on his feet, does not have on his penis.  He has no history of diabetes, thyroid problems. He does have history of arthritis.  No history of gout.  He has not had any treatment or tried anything for this.    ALLERGIES:   Levaquin.  MEDICATIONS:     None.  SURGICAL HISTORY:    Trigger finger releases three years ago, prostate surgery and a total knee replacement.   FAMILY MEDICAL HISTORY:    Positive for arthritis, otherwise negative.   SOCIAL HISTORY:     He does not smoke or drink.  He is married and works as a Freight forwarder at [employer]:     Positive for glasses, asthma, depression, otherwise negative 14 points. [name] is an 59 y.o. male.   Chief Complaint: catching index fingers bilateral hands HPI: see above  Past Medical History  Diagnosis Date  . Dyslipidemia     no current med.  . Arthritis   . Depression   . Anxiety   . S/P radiation therapy 12/15/2014 - 02/01/2015         . Stenosing tenosynovitis of finger 09/2015    bilateral index finger  . GERD (gastroesophageal reflux disease)     no current med.  [name] History of prostate cancer   . COPD (chronic obstructive pulmonary disease) (HCC)     mild; denies SOB with ADLs; no current med.  . Pulmonary nodules     "stable", per CT chest 06/08/2015  . Asthma     no current med.  . Dental crowns present     Past Surgical History  Procedure Laterality Date  . Trigger finger release  09/04/2012    Procedure: MINOR RELEASE TRIGGER FINGER/A-1 PULLEY;  Surgeon: Cammie Sickle., MD;   Location: [facility] [place];  Service: Orthopedics;  Laterality: Left;  left long and cyst excision left long  . Robot assisted laparoscopic radical prostatectomy Bilateral 03/26/2013    Procedure: ROBOTIC ASSISTED LAPAROSCOPIC RADICAL PROSTATECTOMY LEVEL 2;  Surgeon: Dutch Gray, MD;  Location: [facility];  Service: Urology;  Laterality: Bilateral;  3 HRS   . Lymphadenectomy Bilateral 03/26/2013    Procedure: LYMPHADENECTOMY;  Surgeon: Dutch Gray, MD;  Location: [facility];  Service: Urology;  Laterality: Bilateral;  . Total knee arthroplasty Left 07/15/2013    Dr Percell Miller  . Total knee arthroplasty Left 07/15/2013    Procedure: TOTAL KNEE ARTHROPLASTY;  Surgeon: Ninetta Lights, MD;  Location: [facility];  Service: Orthopedics;  Laterality: Left;  . Trigger finger release Bilateral 12/09/2014    Procedure: RELEASE A-1 PULLEY RIGHT MIDDLE FINGER,RELEASE A-1 PULLEY LEFT RING FINGER;  Surgeon: Daryll Brod, MD;  Location: [facility];  Service: Orthopedics;  Laterality: Bilateral;  . Inguinal hernia repair Left   . Knee arthroscopy Bilateral     multiple   . Septoplasty  08/01/2000  . Nasal turbinate reduction  08/01/2000  . Nasal concha bullosa resection Right 08/01/2000  . Inguinal hernia repair Right 03/30/2004  . Hydrocele excision Right 03/30/2004    Family History  Problem Relation Age of Onset  . Prostate cancer  Father   . Rheumatic fever Paternal Grandmother   . Hyperlipidemia Sister    Social History:  reports that he quit smoking about 16 years ago. He has never used smokeless tobacco. He reports that he does not drink alcohol or use illicit drugs.  Allergies:  Allergies  Allergen Reactions  . Levaquin [Levofloxacin] Other (See Comments)    LEG CRAMPS, CHILLS    Medications Prior to Admission  Medication Sig Dispense Refill  . buPROPion (WELLBUTRIN XL) 150 MG 24 hr tablet Take 1 tablet (150 mg total) by mouth daily. 90 tablet 1  . escitalopram (LEXAPRO) 20 MG tablet TAKE 1  TABLET (20 MG TOTAL) BY MOUTH DAILY. 90 tablet 1  . Multiple Vitamin (MULTIVITAMIN) tablet Take 1 tablet by mouth daily.    . Omega-3 Fatty Acids (OMEGA 3 PO) Take 2,400 mg by mouth daily.      No results found for this or any previous visit (from the past 48 hour(s)).  No results found.   Pertinent items are noted in HPI.  Blood pressure 122/78, pulse 64, temperature 98.2 F (36.8 C), temperature source Oral, resp. rate 20, height 6' (1.829 m), weight 84.823 kg (187 lb), SpO2 100 %.  General appearance: alert, cooperative and appears stated age Head: Normocephalic, without obvious abnormality Neck: no JVD Resp: clear to auscultation bilaterally Cardio: regular rate and rhythm, S1, S2 normal, no murmur, click, rub or gallop GI: soft, non-tender; bowel sounds normal; no masses,  no organomegaly Extremities: catching index fingers Pulses: 2+ and symmetric Skin: Skin color, texture, turgor normal. No rashes or lesions Neurologic: Grossly normal Incision/Wound: na  Assessment/Plan Diagnosis: bilateral trigger index fingers The procedure, risks, benefits and postoperative course were again discussed with the patient and he is in agreement with this plan.  He will call when he is ready to schedule surgery.   Samuel Richmond R 09/30/2015, 1:36 PM

## 2015-09-30 NOTE — Discharge Instructions (Addendum)
[facility] Instructions Hand Surgery  Wound Care: Keep your hand elevated above the level of your heart.  Do not allow it to dangle by your side.  Keep the dressing dry and do not remove it unless your doctor advises you to do so.  He will usually change it at the time of your post-op visit.  Moving your fingers is advised to stimulate circulation but will depend on the site of your surgery.  If you have a splint applied, your doctor will advise you regarding movement.  Activity: Do not drive or operate machinery today.  Rest today and then you may return to your normal activity and work as indicated by your physician.  Diet:  Drink liquids today or eat a light diet.  You may resume a regular diet tomorrow.    General expectations: Pain for two to three days. Fingers may become slightly swollen.  Call your doctor if any of the following occur: Severe pain not relieved by pain medication. Elevated temperature. Dressing soaked with blood. Inability to move fingers. White or bluish color to fingers.  He may remove dressing tomorrow and apply nexcare bandaids   Post Anesthesia Home Care Instructions  Activity: Get plenty of rest for the remainder of the day. A responsible adult should stay with you for 24 hours following the procedure.  For the next 24 hours, DO NOT: -Drive a car -Paediatric nurse -Drink alcoholic beverages -Take any medication unless instructed by your physician -Make any legal decisions or sign important papers.  Meals: Start with liquid foods such as gelatin or soup. Progress to regular foods as tolerated. Avoid greasy, spicy, heavy foods. If nausea and/or vomiting occur, drink only clear liquids until the nausea and/or vomiting subsides. Call your physician if vomiting continues.  Special Instructions/Symptoms: Your throat may feel dry or sore from the anesthesia or the breathing tube placed in your throat during surgery. If this causes discomfort, gargle  with warm salt water. The discomfort should disappear within 24 hours.  If you had a scopolamine patch placed behind your ear for the management of post- operative nausea and/or vomiting:  1. The medication in the patch is effective for 72 hours, after which it should be removed.  Wrap patch in a tissue and discard in the trash. Wash hands thoroughly with soap and water. 2. You may remove the patch earlier than 72 hours if you experience unpleasant side effects which may include dry mouth, dizziness or visual disturbances. 3. Avoid touching the patch. Wash your hands with soap and water after contact with the patch.

## 2015-09-30 NOTE — Anesthesia Postprocedure Evaluation (Signed)
Anesthesia Post Note  Patient: Samuel Richmond  Procedure(s) Performed: Procedure(s) (LRB): RELEASE TRIGGER FINGER/A-1 PULLEY BILATERAL INDEX FINGERS (Bilateral)  Anesthesia type: general  Patient location: PACU  Post pain: Pain level controlled  Post assessment: Patient's Cardiovascular Status Stable  Last Vitals:  Filed Vitals:   09/30/15 1554  BP: 129/81  Pulse: 77  Temp: 36.1 C  Resp: 20    Post vital signs: Reviewed and stable  Level of consciousness: sedated  Complications: No apparent anesthesia complications

## 2015-09-30 NOTE — Anesthesia Procedure Notes (Signed)
Procedure Name: LMA Insertion Date/Time: 09/30/2015 2:12 PM Performed by: Toula Moos L Pre-anesthesia Checklist: Patient identified, Emergency Drugs available, Suction available, Patient being monitored and Timeout performed Patient Re-evaluated:Patient Re-evaluated prior to inductionOxygen Delivery Method: Circle System Utilized Preoxygenation: Pre-oxygenation with 100% oxygen Intubation Type: IV induction Ventilation: Mask ventilation without difficulty LMA: LMA inserted LMA Size: 5.0 Number of attempts: 1 Airway Equipment and Method: Bite block Placement Confirmation: positive ETCO2 Tube secured with: Tape Dental Injury: Teeth and Oropharynx as per pre-operative assessment

## 2015-09-30 NOTE — Brief Op Note (Signed)
09/30/2015  3:04 PM  PATIENT:  Samuel Richmond  59 y.o. male  PRE-OPERATIVE DIAGNOSIS:  STENOSING TENOSYNOVITIS BILATERAL INDEX FINGERS  POST-OPERATIVE DIAGNOSIS:  STENOSING TENOSYNOVITIS BILATERAL INDEX FINGERS  PROCEDURE:  Procedure(s): RELEASE TRIGGER FINGER/A-1 PULLEY BILATERAL INDEX FINGERS (Bilateral)  SURGEON:  Surgeon(s) and Role:    * Daryll Brod, MD - Primary  PHYSICIAN ASSISTANT:   ASSISTANTS: none   ANESTHESIA:   local and general  EBL:  Total I/O In: 1000 [I.V.:1000] Out: -   BLOOD ADMINISTERED:none  DRAINS: none   LOCAL MEDICATIONS USED:  BUPIVICAINE   SPECIMEN:  No Specimen  DISPOSITION OF SPECIMEN:  N/A  COUNTS:  YES  TOURNIQUET:   Total Tourniquet Time Documented: Forearm (Left) - 20 minutes Total: Forearm (Left) - 20 minutes  Forearm (Right) - 15 minutes Total: Forearm (Right) - 15 minutes   DICTATION: .Other Dictation: Dictation Number [id number]  PLAN OF CARE: Discharge to home after PACU  PATIENT DISPOSITION:  PACU - hemodynamically stable.

## 2015-10-01 NOTE — Op Note (Signed)
NAME:  Samuel Richmond, Samuel Richmond NO.:  [id number]  MEDICAL RECORD NO.:  [id number]  LOCATION:                                 FACILITY:  PHYSICIAN:  Daryll Brod, M.D.       DATE OF BIRTH:  January 30, 1956  DATE OF PROCEDURE:  09/30/2015 DATE OF DISCHARGE:                              OPERATIVE REPORT   PREOPERATIVE DIAGNOSIS:  Stenosing tenosynovitis, right index finger. Stenosing tenosynovitis, left index finger.  POSTOPERATIVE DIAGNOSIS:  Stenosing tenosynovitis, right index finger. Stenosing tenosynovitis, left index finger.  OPERATION:  Release of A1 pulley, left index.  Release of A1 pulley, right index.  SURGEON:  Daryll Brod, MD.  ASSISTANT:  None.  ANESTHESIA:  General with local infiltration.  ANESTHESIOLOGIST:  Crissie Sickles. Conrad [place], M.D.  HISTORY:  The patient is a 59 year old male with history of triggering of multiple fingers.  He has developed bilateral trigger fingers.  He has elected to undergo surgical decompression of each having had multiple trigger fingers release in the past.  Pre, peri, and postoperative courses he is aware of including infection; recurrence; injury to arteries, nerves, tendons; incomplete relief of symptoms; dystrophy.  In the preoperative area, the patient is seen, the extremity marked by both patient and surgeon.  Antibiotic given.  PROCEDURE IN DETAIL:  The patient was brought to the operating room, where a general anesthetic was carried out without difficulty.  He was prepped using ChloraPrep, supine position with the left arm free.  A 3- minute dry time was allowed.  Time-out taken, confirming the patient and procedure.  The limb was exsanguinated with an Esmarch bandage. Tourniquet placed on the forearm was inflated to 250 mmHg.  An oblique incision was made over the A1 pulley of left index finger, carried down through subcutaneous tissue.  Bleeders were electrocauterized with bipolar.  The dissection carried down to the A1  pulley which was markedly thickened.  This was released on its radial aspect after placing retractors protecting neurovascular bundles radially and ulnarly.  The A1 pulley was released on its radial aspect.  A small incision made centrally in A2.  Partial tenosynovectomy performed proximally with separation of the 2 tendons.  Finger placed through full passive flexion, no further triggering was noted.  The wound was irrigated with saline.  The skin then closed with a subcuticular 5-0 Monocryl suture and then Dermabond applied.  A sterile compressive dressing was applied after local infiltration with 0.25% bupivacaine without epinephrine, 4 mL was used.  On deflation of the tourniquet, all fingers immediately pinked.  His right arm was then prepped and draped using ChloraPrep, supine position again, the arm free.  The limb exsanguinated with an Esmarch bandage.  Tourniquet placed on the high and the arm was inflated to 250 mmHg.  An oblique incision was made over the A1 pulley of the right index finger, carried down through subcutaneous tissue.  Bleeders again electrocauterized.  Retractors placed protecting neurovascular bundles radially and ulnarly.  Release of the A1 pulley was then on its radial aspect with a small incision made centrally in A2.  Partial tenosynovectomy performed proximally along with separation of the 2 tendons.  The finger placed through  full flexion passively, no further triggering was noted.  The wound was irrigated.  The skin was then closed with a subcuticular 5-0 Monocryl suture and Dermabond.  A sterile compressive dressing was applied after local infiltration with 0.25% bipuvicaine, 4 mL again was used.  The patient tolerated the procedure well and was taken to the recovery room for observation in satisfactory condition.  He will be discharged home to return to the [facility] in 1 week, on Vicodin.           ______________________________ Daryll Brod, M.D.     GK/MEDQ  D:  09/30/2015  T:  10/01/2015  Job:  [id number]

## 2015-10-03 ENCOUNTER — Encounter (HOSPITAL_BASED_OUTPATIENT_CLINIC_OR_DEPARTMENT_OTHER): Payer: Self-pay | Admitting: Orthopedic Surgery

## 2015-12-28 ENCOUNTER — Other Ambulatory Visit: Payer: Self-pay

## 2015-12-28 ENCOUNTER — Encounter: Payer: Self-pay | Admitting: Gastroenterology

## 2015-12-28 ENCOUNTER — Telehealth: Payer: Self-pay | Admitting: Family Medicine

## 2015-12-28 DIAGNOSIS — Z1211 Encounter for screening for malignant neoplasm of colon: Secondary | ICD-10-CM

## 2015-12-28 NOTE — Telephone Encounter (Signed)
Patient would like to have referral for Colonoscopy since correction made on date due.

## 2015-12-28 NOTE — Telephone Encounter (Signed)
Ok for referral to GI for colon cancer screen

## 2015-12-28 NOTE — Telephone Encounter (Signed)
Relation to [id number] Call back number:[phone] Pharmacy:  Reason for call:  Patient would like to discuss when he's due for COLONOSCOPY

## 2015-12-30 ENCOUNTER — Other Ambulatory Visit: Payer: Self-pay

## 2015-12-30 ENCOUNTER — Other Ambulatory Visit: Payer: Self-pay | Admitting: Family Medicine

## 2015-12-30 DIAGNOSIS — Z1211 Encounter for screening for malignant neoplasm of colon: Secondary | ICD-10-CM

## 2015-12-30 NOTE — Telephone Encounter (Signed)
Referral department has called patient with appointment.

## 2015-12-30 NOTE — Telephone Encounter (Addendum)
Order placed. Patient notified.  

## 2016-01-02 NOTE — Progress Notes (Signed)
Order(s) created erroneously. Erroneous order ID: [id number]  Order moved by: Darden Palmer  Order move date/time: 01/02/2016 10:36 AM  Source Patient: [id number]  Source Contact: 09/01/2014  Destination Patient: [id number]  Destination Contact: 02/17/2013

## 2016-02-19 IMAGING — CT CT CHEST W/O CM
2 of 3 series · 15 of 36 positions shown, 18 images · non-contrast
Comparison: Chest CT 01/10/2007.

CLINICAL DATA: 59-year-old male with history of pulmonary nodules
noted on prior CT scan from [facility]. Currently asymptomatic.

EXAM:
CT CHEST WITHOUT CONTRAST
TECHNIQUE: Multidetector CT imaging of the chest was performed following the
standard protocol without IV contrast.

[Series 2: chest 5.0 b31f · axial · 0.68mm/px · z∈[+1170,+1450]mm · 12 of 66 slices shown, 15 images]
[im 5/66  mediastinal]
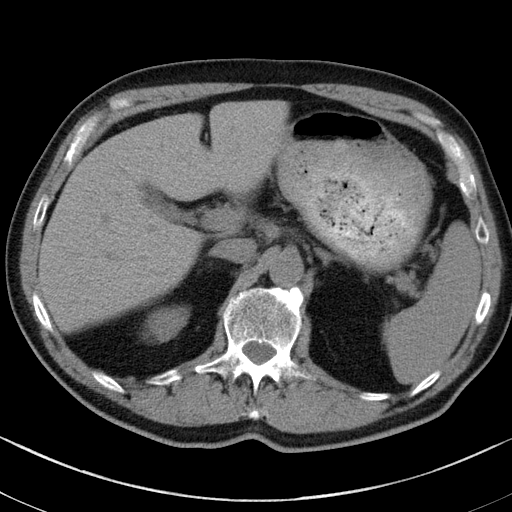
[im 5/66  lung]
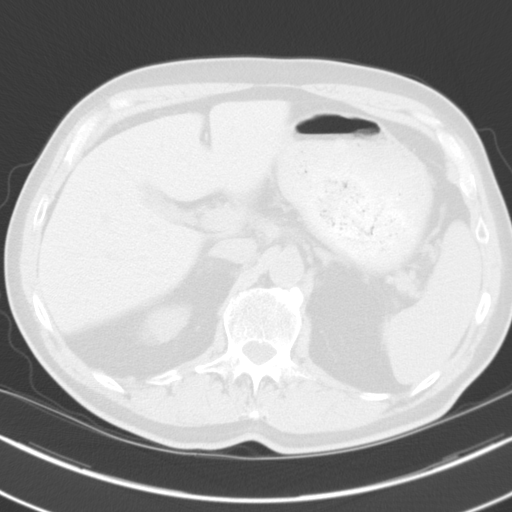
[im 10/66  lung]
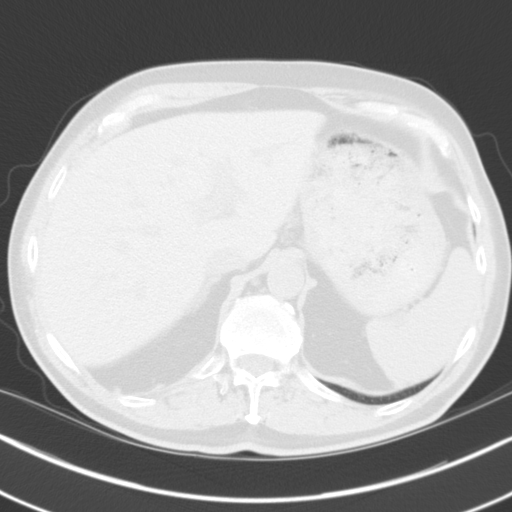
[im 15/66  lung]
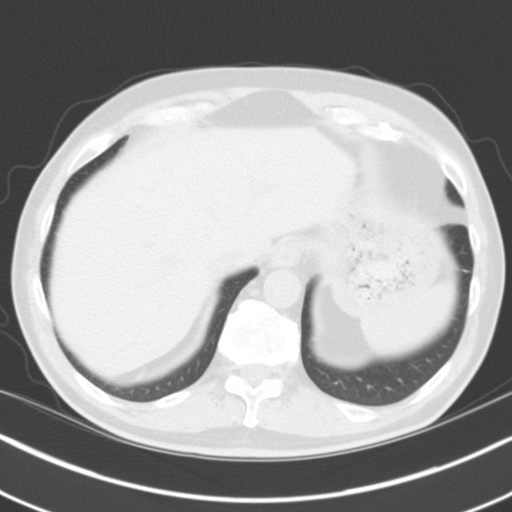
[im 20/66  lung]
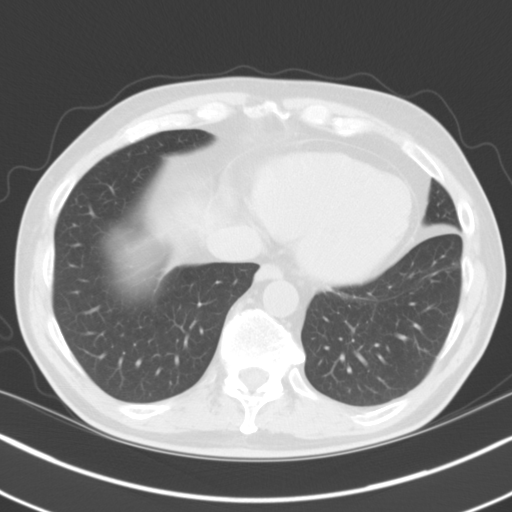
[im 25/66  mediastinal]
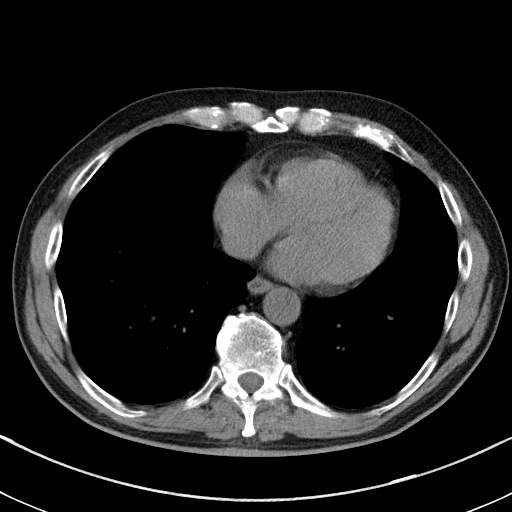
[im 25/66  lung]
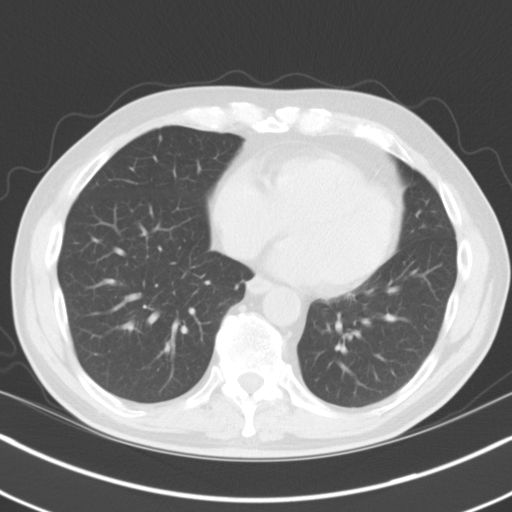
[im 29/66  lung]
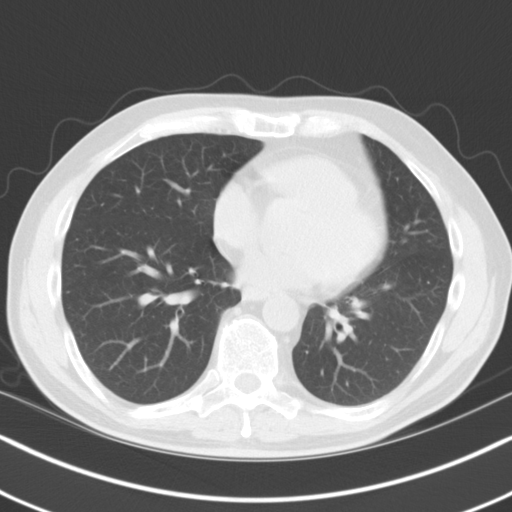
[im 37/66  lung]
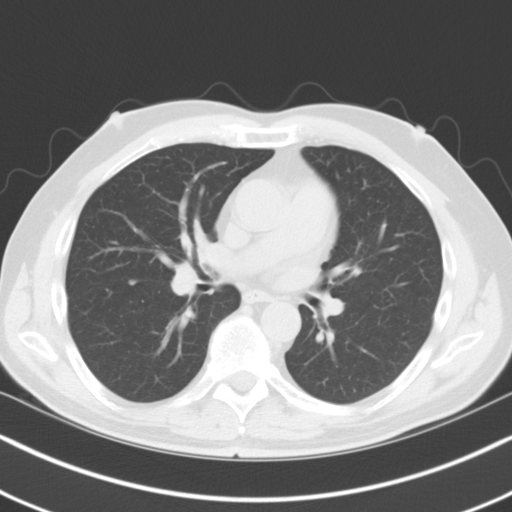
[im 41/66  lung]
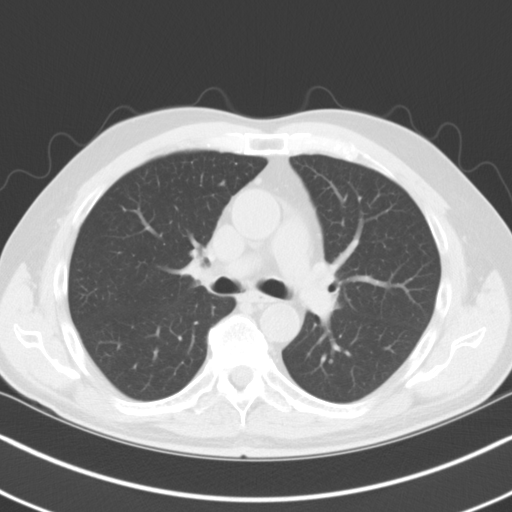
[im 46/66  mediastinal]
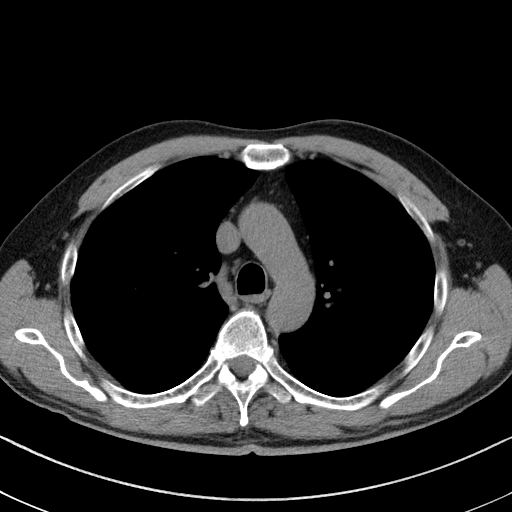
[im 46/66  lung]
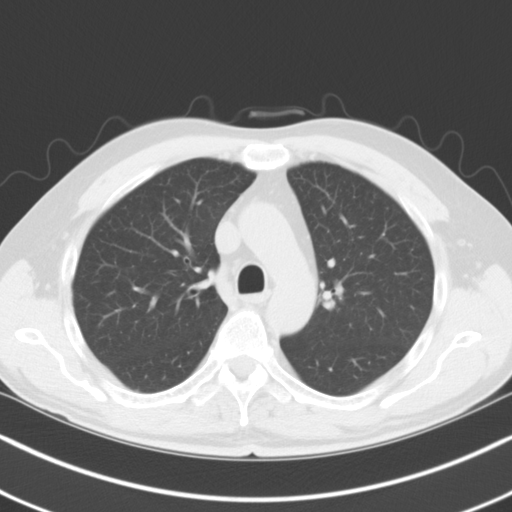
[im 51/66  lung]
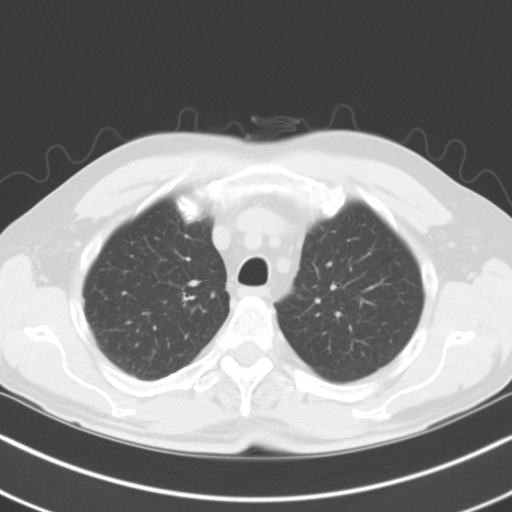
[im 56/66  lung]
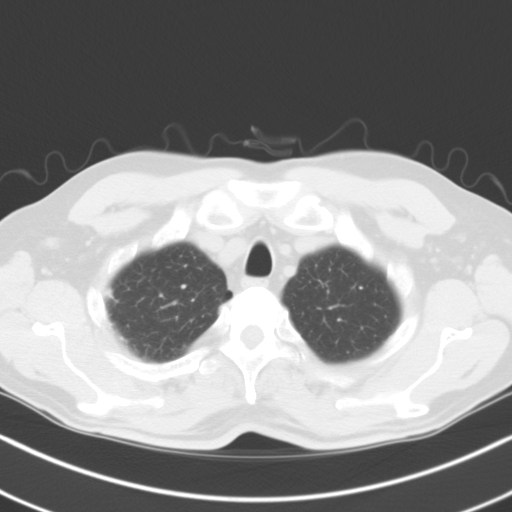
[im 61/66  lung]
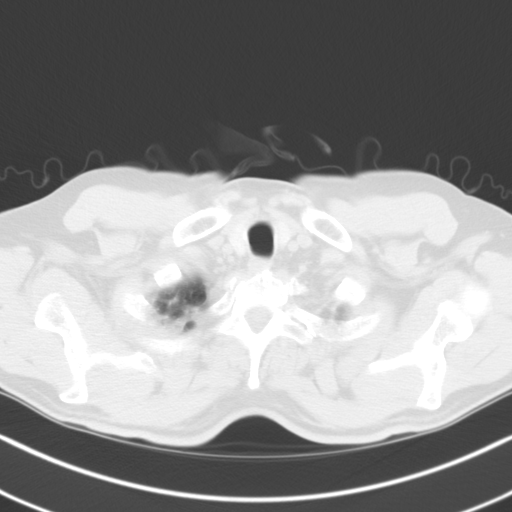

[Series 6: chest 3.0 coronal · coronal · 0.64mm/px · 3 of 90 slices shown]
[im 18/90  lung]
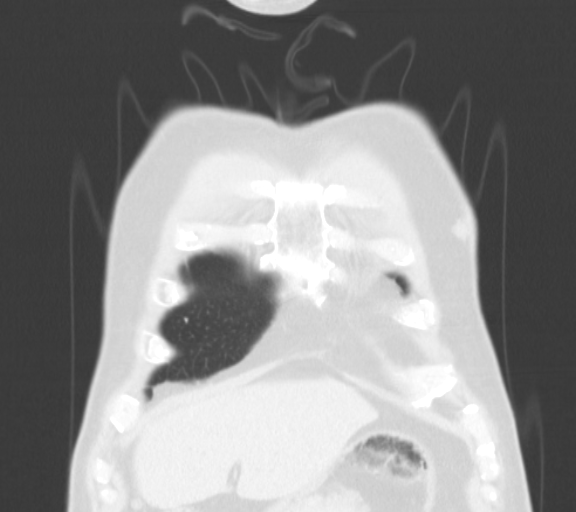
[im 36/90  lung]
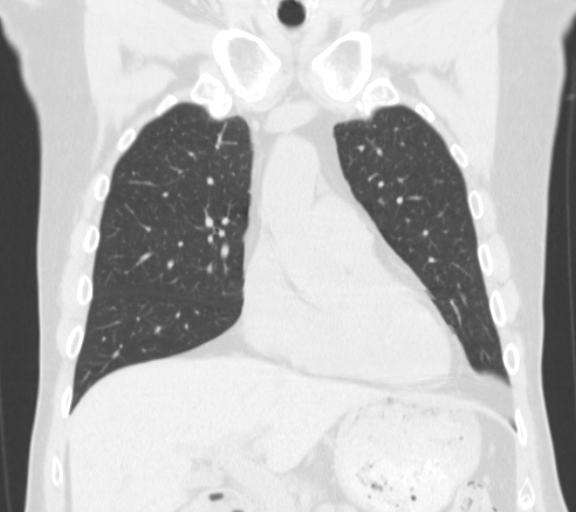
[im 54/90  lung]
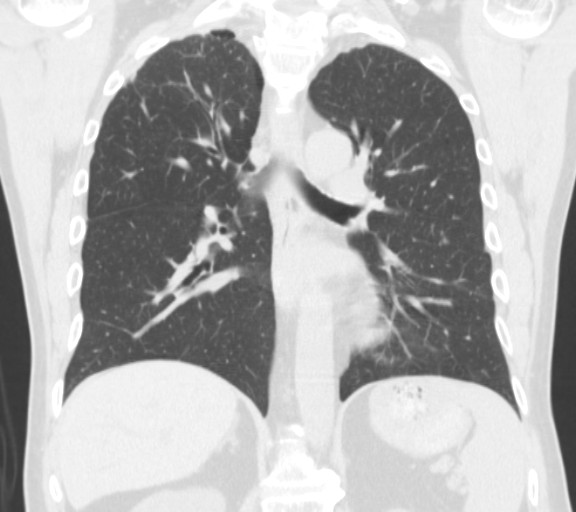

[15 of 36 positions shown; findings below may reference images not displayed]

FINDINGS: Mediastinum/Lymph Nodes: Heart size is normal. There is no
significant pericardial fluid, thickening or pericardial
calcification. There is atherosclerosis of the thoracic aorta, the
great vessels of the mediastinum and the coronary arteries,
including calcified atherosclerotic plaque in the left anterior
descending and right coronary arteries. No pathologically enlarged
mediastinal or hilar lymph nodes. Please note that accurate
exclusion of hilar adenopathy is limited on noncontrast CT scans.
Esophagus is unremarkable in appearance.

Lungs/Pleura: 3 mm nodule lateral segment of the right middle lobe
(image 42 of series 3) is unchanged, considered benign. 5 mm
subpleural nodule along the right major fissure (image 30 of series
3), also unchanged, also considered benign (likely a subpleural
lymph node). 3 mm subpleural nodule in the superior segment of the
right lower lobe (image 22 of series 3) unchanged, considered
benign, likely a subpleural lymph node. 5 mm subpleural nodule along
the left major fissure (image 32 of series 3) is unchanged,
considered benign (likely a subpleural lymph node). 3 mm pulmonary
nodule in the apex of the left upper lobe (image 11 of series 3) is
unchanged, considered benign. No other new suspicious appearing
pulmonary nodules or masses. No acute consolidative airspace
disease. No pleural effusions.

Upper Abdomen: Unremarkable.

Musculoskeletal/Soft Tissues: There are no aggressive appearing
lytic or blastic lesions noted in the visualized portions of the
skeleton.
IMPRESSION: 1. Multiple tiny pulmonary nodules are unchanged in size, number and
pattern of distribution compared to remote prior CT scan from
02/03/2007 and considered benign, as above.
2. Atherosclerosis, including 2 vessel coronary artery disease.
Please note that although the presence of coronary artery calcium
documents the presence of coronary artery disease, the severity of
this disease and any potential stenosis cannot be assessed on this
non-gated CT examination. Assessment for potential risk factor
modification, dietary therapy or pharmacologic therapy may be
warranted, if clinically indicated.

## 2016-02-25 ENCOUNTER — Other Ambulatory Visit: Payer: Self-pay | Admitting: Family Medicine

## 2016-02-27 NOTE — Telephone Encounter (Signed)
Medication filled to pharmacy as requested.   

## 2016-03-15 ENCOUNTER — Ambulatory Visit (INDEPENDENT_AMBULATORY_CARE_PROVIDER_SITE_OTHER): Payer: BLUE CROSS/BLUE SHIELD | Admitting: Internal Medicine

## 2016-03-15 ENCOUNTER — Encounter: Payer: Self-pay | Admitting: Internal Medicine

## 2016-03-15 VITALS — BP 106/60 | HR 82 | Ht 72.0 in | Wt 191.6 lb

## 2016-03-15 DIAGNOSIS — R0600 Dyspnea, unspecified: Secondary | ICD-10-CM

## 2016-03-15 DIAGNOSIS — R0609 Other forms of dyspnea: Secondary | ICD-10-CM

## 2016-03-15 NOTE — Progress Notes (Signed)
Subjective:    Patient ID: Samuel Richmond, male    DOB: [birth year],    MRN: [id number]  HPI  62 yowm quit smoking 2000 with sob and cough and extensive w/u in this clinic and at [facility] "never got a dx" self referred back to this clinic 03/15/2016    03/15/2016 1st [facility] office visit/ [facility]   Chief Complaint  Patient presents with  . Pulmonary Consult    Self referral. Last seen here in 2006.  He states I have had breathing problems "for years" "I think I have Emphysema and my lung are hyperinflated".   since quit smoking in 200 always bothered by doe but limited due L knee / rides stationery bike x 15 min/ with episodes that last up to 2 months of refractory sob at rest and 80% better spont at time of ov / does not disturb sleep. Daytime cough pattern, not waking him up, cough dry  tends to be worse when breathing worse> no better on GERD Rx  Already tried on inhalers including foradil with no improvement and now on spiriva not sure it's doing anything to pattern of "months of sob then all better" for up to a year   No obvious  patterns in day to day or daytime variabilty or assoc excess/ purulent sputum or mucus plugs   or cp or chest tightness, subjective wheeze overt sinus or hb symptoms. No unusual exp hx or h/o childhood pna/ asthma or knowledge of premature birth.  Sleeping ok without nocturnal  or early am exacerbation  of respiratory  c/o's or need for noct saba. Also denies any obvious fluctuation of symptoms with weather or environmental changes or other aggravating or alleviating factors except as outlined above   Current Medications, Allergies, Complete Past Medical History, Past Surgical History, Family History, and Social History were reviewed in [employer] record.             Review of Systems  Constitutional: Negative for fever, chills, activity change, appetite change and unexpected weight change.  HENT: Negative for congestion,  dental problem, postnasal drip, rhinorrhea, sneezing, sore throat, trouble swallowing and voice change.   Eyes: Negative for visual disturbance.  Respiratory: Positive for shortness of breath. Negative for cough and choking.   Cardiovascular: Negative for chest pain and leg swelling.  Gastrointestinal: Negative for nausea, vomiting and abdominal pain.  Genitourinary: Negative for difficulty urinating.  Musculoskeletal: Negative for arthralgias.  Skin: Negative for rash.  Psychiatric/Behavioral: Negative for behavioral problems and confusion.       Objective:   Physical Exam  amb wm Patient failed to answer a single question asked in a straightforward manner, tending to go off on tangents or answer questions with ambiguous medical terms or diagnoses and seemed aggravated  when asked the same question more than once for clarification.   Wt Readings from Last 3 Encounters:  03/15/16 191 lb 9.6 oz (86.909 kg)  09/30/15 187 lb (84.823 kg)  07/05/15 181 lb 3.2 oz (82.192 kg)    Vital signs reviewed    HEENT: nl dentition, turbinates, and oropharynx. Nl external ear canals without cough reflex   NECK :  without JVD/Nodes/TM/ nl carotid upstrokes bilaterally   LUNGS: no acc muscle use,  Nl contour chest which is clear to A and P bilaterally without cough on insp or exp maneuvers   CV:  RRR  no s3 or murmur or increase in P2, no edema   ABD:  soft  and nontender with nl inspiratory excursion in the supine position. No bruits or organomegaly, bowel sounds nl  MS:  Nl gait/ ext warm without deformities, calf tenderness, cyanosis or clubbing No obvious joint restrictions   SKIN: warm and dry without lesions    NEURO:  alert, approp, nl sensorium with  no motor deficits       I personally reviewed images and agree with radiology impression as follows:  CT Chest   06/08/15  Multiple tiny pulmonary nodules are unchanged in size, number and pattern of distribution compared to remote  prior CT scan from 02/03/2007 and considered benign, as above.        Assessment & Plan:

## 2016-03-15 NOTE — Patient Instructions (Addendum)
Stop fish oil and spiriva for now   Try prilosec otc 20mg   Take 30-60 min before first meal of the day and Pepcid ac (famotidine) 20 mg one @  bedtime  Until / including day you return for a methacholine challenge test  GERD (REFLUX)  is an extremely common cause of respiratory symptoms just like yours , many times with no obvious heartburn at all.    It can be treated with medication, but also with lifestyle changes including elevation of the head of your bed (ideally with 6 inch  bed blocks),  Smoking cessation, avoidance of late meals, excessive alcohol, and avoid fatty foods, chocolate, peppermint, colas, red wine, and acidic juices such as orange juice.  NO MINT OR MENTHOL PRODUCTS SO NO COUGH DROPS  USE SUGARLESS CANDY INSTEAD (Jolley ranchers or Stover's or Life Savers) or even ice chips will also do - the key is to swallow to prevent all throat clearing. NO OIL BASED VITAMINS - use powdered substitutes.    Do purse lip breathing as I demonstrated to you today whenever you feel tight in the chest or short of breath     Please see patient coordinator before you leave today  to schedule methacholine challenge test in two weeks no sooner

## 2016-03-17 NOTE — Assessment & Plan Note (Addendum)
[place] note 2009 / pulmonary:  Dx asthma/gerd "well controlled" with anxiety component rec clonazepam > pt denied at ov .03/15/2016 any medication helping his symptoms and only change noted from meds  was irritation from ICS  -PFT's  12/23/2009   FEV1 2.97 (76 % ) ratio 69   with DLCO  84 %   -Methacholine challenge test 03/15/2016 >>>   Symptoms are markedly disproportionate to objective findings and not clear this is a lung problem but pt does appear to have difficult airway management issues. DDX of  difficult airways management almost all start with A and  include Adherence, Ace Inhibitors, Acid Reflux, Active Sinus Disease, Alpha 1 Antitripsin deficiency, Anxiety masquerading as Airways dz,  ABPA,  Allergy(esp in young), Aspiration (esp in elderly), Adverse effects of meds,  Active smokers, A bunch of PE's (a small clot burden can't cause this syndrome unless there is already severe underlying pulm or vascular dz with poor reserve) plus two Bs  = Bronchiectasis and Beta blocker use..and one C= CHF  ? Acid (or non-acid) GERD > always difficult to exclude as up to 75% of pts in some series report no assoc GI/ Heartburn symptoms> rec max (24h)  acid suppression and diet restrictions/ reviewed and instructions given in writing.    - suggested by assoc daytime coughing and if he does have reflux/ so far he's only been treated with acid suppression, which is only half the battle:  rec diet/acid rx - see avs  ? Allergy/asthma > needs MCT on gerd rx to sort out but only while on max gerd rx x 2 weeks minimal   ? Anxiety > usually at the bottom of this list of usual suspects but should be much higher on this pt's based on H and P and note already on psychotropics .   No evidence of sign copd > ok to try off spiriva for now  Total time devoted to counseling  = 35/16m review case with pt including available data from [facility] in care everywhere/ discussion of options/alternatives/ personally creating in presence of  pt  then going over specific  Instructions directly with the pt including how to use all of the meds but in particular covering each new medication in detail (see avs)

## 2016-03-29 ENCOUNTER — Ambulatory Visit (HOSPITAL_COMMUNITY)
Admission: RE | Admit: 2016-03-29 | Discharge: 2016-03-29 | Disposition: A | Discharge status: Home / Self Care | Payer: BLUE CROSS/BLUE SHIELD | Source: Ambulatory Visit | Attending: Internal Medicine | Admitting: Internal Medicine

## 2016-03-29 DIAGNOSIS — R0600 Dyspnea, unspecified: Secondary | ICD-10-CM | POA: Diagnosis not present

## 2016-03-29 LAB — PULMONARY FUNCTION TEST
FEF 25-75 POST: 1.98 L/s
FEF 25-75 Pre: 1.54 L/sec
FEF2575-%Change-Post: 28 %
FEF2575-%Pred-Post: 66 %
FEF2575-%Pred-Pre: 51 %
FEV1-%CHANGE-POST: 5 %
FEV1-%PRED-PRE: 74 %
FEV1-%Pred-Post: 78 %
FEV1-POST: 2.89 L
FEV1-Pre: 2.73 L
FEV1FVC-%Change-Post: 4 %
FEV1FVC-%Pred-Pre: 88 %
FEV6-%Change-Post: 3 %
FEV6-%PRED-POST: 89 %
FEV6-%PRED-PRE: 87 %
FEV6-POST: 4.18 L
FEV6-PRE: 4.04 L
FEV6FVC-%CHANGE-POST: 1 %
FEV6FVC-%PRED-POST: 104 %
FEV6FVC-%PRED-PRE: 103 %
FVC-%CHANGE-POST: 1 %
FVC-%PRED-POST: 86 %
FVC-%PRED-PRE: 84 %
FVC-POST: 4.2 L
FVC-PRE: 4.12 L
PRE FEV6/FVC RATIO: 98 %
Post FEV1/FVC ratio: 69 %
Post FEV6/FVC ratio: 99 %
Pre FEV1/FVC ratio: 66 %

## 2016-03-29 MED ORDER — METHACHOLINE 0.25 MG/ML NEB SOLN
2.0000 mL | Freq: Once | RESPIRATORY_TRACT | Status: AC
  Administered 2016-03-29: 0.5 mg via RESPIRATORY_TRACT

## 2016-03-29 MED ORDER — METHACHOLINE 16 MG/ML NEB SOLN
2.0000 mL | Freq: Once | RESPIRATORY_TRACT | Status: AC
  Administered 2016-03-29: 32 mg via RESPIRATORY_TRACT

## 2016-03-29 MED ORDER — METHACHOLINE 4 MG/ML NEB SOLN
2.0000 mL | Freq: Once | RESPIRATORY_TRACT | Status: AC
  Administered 2016-03-29: 8 mg via RESPIRATORY_TRACT

## 2016-03-29 MED ORDER — SODIUM CHLORIDE 0.9 % IN NEBU
3.0000 mL | INHALATION_SOLUTION | Freq: Once | RESPIRATORY_TRACT | Status: AC
  Administered 2016-03-29: 3 mL via RESPIRATORY_TRACT

## 2016-03-29 MED ORDER — ALBUTEROL SULFATE (2.5 MG/3ML) 0.083% IN NEBU
2.5000 mg | INHALATION_SOLUTION | Freq: Once | RESPIRATORY_TRACT | Status: AC
  Administered 2016-03-29: 2.5 mg via RESPIRATORY_TRACT

## 2016-03-29 MED ORDER — METHACHOLINE 0.0625 MG/ML NEB SOLN
2.0000 mL | Freq: Once | RESPIRATORY_TRACT | Status: AC
  Administered 2016-03-29: 0.125 mg via RESPIRATORY_TRACT

## 2016-03-29 MED ORDER — METHACHOLINE 1 MG/ML NEB SOLN
2.0000 mL | Freq: Once | RESPIRATORY_TRACT | Status: AC
  Administered 2016-03-29: 2 mg via RESPIRATORY_TRACT

## 2016-04-03 ENCOUNTER — Encounter: Payer: Self-pay | Admitting: Gastroenterology

## 2016-04-11 ENCOUNTER — Telehealth: Payer: Self-pay | Admitting: Internal Medicine

## 2016-04-11 NOTE — Progress Notes (Signed)
Quick Note:  LMTCB ______ 

## 2016-04-11 NOTE — Telephone Encounter (Signed)
Notes Recorded by Rosana Berger, CMA on 04/11/2016 at 10:36 AM LMTCB Notes Recorded by Tanda Rockers, MD on 04/10/2016 at 11:44 AM Call patient : Study is unremarkable, neg for asthma/ be sure has f/u ov if not doing better to discuss specifics of study/ regroup re w/u and rx ------------------------------- Pt is aware of results. He wanted to make a follow up appointment with MW. This has been scheduled for 04/12/2016 at 4:30pm.

## 2016-04-12 ENCOUNTER — Ambulatory Visit: Payer: BLUE CROSS/BLUE SHIELD | Admitting: Internal Medicine

## 2016-04-13 ENCOUNTER — Encounter: Payer: Self-pay | Admitting: Gastroenterology

## 2016-04-23 ENCOUNTER — Encounter: Payer: Self-pay | Admitting: Internal Medicine

## 2016-04-23 ENCOUNTER — Ambulatory Visit (INDEPENDENT_AMBULATORY_CARE_PROVIDER_SITE_OTHER): Payer: BLUE CROSS/BLUE SHIELD | Admitting: Internal Medicine

## 2016-04-23 VITALS — BP 122/72 | HR 64 | Ht 72.0 in | Wt 190.0 lb

## 2016-04-23 DIAGNOSIS — R0600 Dyspnea, unspecified: Secondary | ICD-10-CM | POA: Diagnosis not present

## 2016-04-23 NOTE — Progress Notes (Signed)
Subjective:    Patient ID: Samuel Richmond, male    DOB: December 11, 1955,    MRN: [id number]    Brief patient profile:  83 yowm quit smoking 2000 with sob and cough and extensive w/u in this clinic and at [facility] "never got a dx" self referred back to this clinic 03/15/2016    History of Present Illness  03/15/2016 1st [facility] office visit/ Hershall Benkert   Chief Complaint  Patient presents with  . Pulmonary Consult    Self referral. Last seen here in 2006.  He states I have had breathing problems "for years" "I think I have Emphysema and my lung are hyperinflated".   since quit smoking in 200 always bothered by doe but limited due L knee / rides stationery bike x 15 min/ with episodes that last up to 2 months of refractory sob at rest and 80% better spont at time of ov / does not disturb sleep. Daytime cough pattern, not waking him up, cough dry  tends to be worse when breathing worse> no better on GERD Rx  Already tried on inhalers including foradil with no improvement and now on spiriva not sure it's doing anything to pattern of "months of sob then all better" for up to a year  rec Stop fish oil and spiriva for now  Try prilosec otc 20mg   Take 30-60 min before first meal of the day and Pepcid ac (famotidine) 20 mg one @  bedtime  Until / including day you return for a methacholine challenge test GERD (REFLUX)   Please see patient coordinator before you leave today  to schedule methacholine challenge test in two weeks no sooner > neg    04/23/2016  f/u ov/[facility] re: dyspne/ cough >  no rx but ppi/h2hs Chief Complaint  Patient presents with  . Follow-up    Pt is here for PFT results. He would like to know how it compares to his previous PFT from [place]. Pt c/o occasional cough.    no regular ex but really Not limited by breathing from desired activities    No obvious day to day or daytime variability or assoc excess/ purulent sputum or mucus plugs or hemoptysis or cp or chest tightness,  subjective wheeze or overt sinus or hb symptoms. No unusual exp hx or h/o childhood pna/ asthma or knowledge of premature birth.  Sleeping ok without nocturnal  or early am exacerbation  of respiratory  c/o's or need for noct saba. Also denies any obvious fluctuation of symptoms with weather or environmental changes or other aggravating or alleviating factors except as outlined above   Current Medications, Allergies, Complete Past Medical History, Past Surgical History, Family History, and Social History were reviewed in [employer] record.  ROS  The following are not active complaints unless bolded sore throat, dysphagia, dental problems, itching, sneezing,  nasal congestion or excess/ purulent secretions, ear ache,   fever, chills, sweats, unintended wt loss, classically pleuritic or exertional cp,  orthopnea pnd or leg swelling, presyncope, palpitations, abdominal pain, anorexia, nausea, vomiting, diarrhea  or change in bowel or bladder habits, change in stools or urine, dysuria,hematuria,  rash, arthralgias, visual complaints, headache, numbness, weakness or ataxia or problems with walking or coordination,  change in mood/affect or memory.                          Objective:   Physical Exam  amb wm nad   04/23/2016  190  03/15/16 191 lb 9.6 oz (86.909 kg)  09/30/15 187 lb (84.823 kg)  07/05/15 181 lb 3.2 oz (82.192 kg)    Vital signs reviewed    HEENT: nl dentition, turbinates, and oropharynx. Nl external ear canals without cough reflex   NECK :  without JVD/Nodes/TM/ nl carotid upstrokes bilaterally   LUNGS: no acc muscle use,  Nl contour chest which is clear to A and P bilaterally without cough on insp or exp maneuvers   CV:  RRR  no s3 or murmur or increase in P2, no edema   ABD:  soft and nontender with nl inspiratory excursion in the supine position. No bruits or organomegaly, bowel sounds nl  MS:  Nl gait/ ext warm without deformities,  calf tenderness, cyanosis or clubbing No obvious joint restrictions   SKIN: warm and dry without lesions    NEURO:  alert, approp, nl sensorium with  no motor deficits       I personally reviewed images and agree with radiology impression as follows:  CT Chest   06/08/15  Multiple tiny pulmonary nodules are unchanged in size, number and pattern of distribution compared to remote prior CT scan from 02/03/2007 and considered benign, as above.        Assessment & Plan:

## 2016-04-23 NOTE — Patient Instructions (Signed)
You do not have significant copd or asthma at this point and it's unlikely therefore you ever will  Continue the reflux medications and avoid tums   In the event of flare of your symptoms >  prilosec 20 mg x 2 30 min before and supper and pepcid 20 mg at bedtime and pay close attention to the diet   GERD (REFLUX)  is an extremely common cause of respiratory symptoms just like yours , many times with no obvious heartburn at all.    It can be treated with medication, but also with lifestyle changes including elevation of the head of your bed (ideally with 6 inch  bed blocks),  Smoking cessation, avoidance of late meals, excessive alcohol, and avoid fatty foods, chocolate, peppermint, colas, red wine, and acidic juices such as orange juice.  NO MINT OR MENTHOL PRODUCTS SO NO COUGH DROPS  USE SUGARLESS CANDY INSTEAD (Jolley ranchers or Stover's or Life Savers) or even ice chips will also do - the key is to swallow to prevent all throat clearing. NO OIL BASED VITAMINS - use powdered substitutes.

## 2016-04-24 NOTE — Assessment & Plan Note (Signed)
[place] note 2009 / pulmonary:  Dx asthma/gerd "well controlled" with anxiety component rec clonazepam > pt denied at ov .03/15/2016 any medication helping his symptoms and only change noted from meds  Was adverse =  irritation from ICS  PFT's  12/23/2009   FEV1 2.97 (76 % ) ratio 69   with DLCO  84 %   - Try off spiriva 03/15/2016 > no worse   - Methacholine challenge test 03/29/16 > neg for asthma with FEV1 2.89 with ratio 69    I had an extended final summary discussion with the patient reviewing all relevant studies completed to date and  lasting 15 to 20 minutes of a 25 minute visit on the following issues:    He does at baseline barely reach the threshold for dx of copd though that level (below 0.70) is probably too high for pt at age 60 and not age adjusted.  Since he has no baseline symptoms nor evidence for asthma by MCT there is no indication for rx at this point but offered to see back in f/u for any flares and in meantime continue rx for GERD and anxiety as rec also at [facility]  Each maintenance medication was reviewed in detail including most importantly the difference between maintenance and as needed and under what circumstances the prns are to be used.  Please see instructions for details which were reviewed in writing and the patient given a copy.

## 2016-05-02 ENCOUNTER — Other Ambulatory Visit: Payer: Self-pay | Admitting: General Practice

## 2016-05-02 MED ORDER — BUPROPION HCL ER (XL) 150 MG PO TB24: 150.0000 mg | ORAL_TABLET | Freq: Every day | ORAL | Status: DC

## 2016-06-04 ENCOUNTER — Ambulatory Visit (AMBULATORY_SURGERY_CENTER): Payer: Self-pay | Admitting: *Deleted

## 2016-06-04 ENCOUNTER — Encounter: Payer: BLUE CROSS/BLUE SHIELD | Admitting: Family Medicine

## 2016-06-04 VITALS — Ht 72.0 in | Wt 180.0 lb

## 2016-06-04 DIAGNOSIS — Z1211 Encounter for screening for malignant neoplasm of colon: Secondary | ICD-10-CM

## 2016-06-04 MED ORDER — NA SULFATE-K SULFATE-MG SULF 17.5-3.13-1.6 GM/177ML PO SOLN: 1.0000 | Freq: Once | ORAL | Status: DC

## 2016-06-04 NOTE — Progress Notes (Signed)
No egg or soy allergy known to patient  No issues with past sedation with any surgeries  or procedures, no intubation problems  No diet pills per patient No home 02 use per patient  No blood thinners per patient  Pt denies issues with constipation  emmi video declined

## 2016-06-05 ENCOUNTER — Encounter: Payer: Self-pay | Admitting: Gastroenterology

## 2016-06-07 ENCOUNTER — Other Ambulatory Visit: Payer: Self-pay | Admitting: General Practice

## 2016-06-07 MED ORDER — ESCITALOPRAM OXALATE 20 MG PO TABS: ORAL_TABLET | ORAL | Status: DC

## 2016-06-15 ENCOUNTER — Encounter: Payer: Self-pay | Admitting: Gastroenterology

## 2016-06-15 ENCOUNTER — Ambulatory Visit (AMBULATORY_SURGERY_CENTER): Payer: BLUE CROSS/BLUE SHIELD | Admitting: Gastroenterology

## 2016-06-15 VITALS — BP 116/70 | HR 60 | Temp 98.8°F | Resp 14 | Ht 72.0 in | Wt 180.0 lb

## 2016-06-15 DIAGNOSIS — Z1211 Encounter for screening for malignant neoplasm of colon: Secondary | ICD-10-CM

## 2016-06-15 MED ORDER — SODIUM CHLORIDE 0.9 % IV SOLN: 500.0000 mL | INTRAVENOUS | Status: DC

## 2016-06-15 NOTE — Progress Notes (Signed)
A and O x3. Report to RN. Tolerated MAC anesthesia well. 

## 2016-06-15 NOTE — Patient Instructions (Signed)
YOU HAD AN ENDOSCOPIC PROCEDURE TODAY AT THE [facility]:   Refer to the procedure report that was given to you for any specific questions about what was found during the examination.  If the procedure report does not answer your questions, please call your gastroenterologist to clarify.  If you requested that your care partner not be given the details of your procedure findings, then the procedure report has been included in a sealed envelope for you to review at your convenience later.  YOU SHOULD EXPECT: Some feelings of bloating in the abdomen. Passage of more gas than usual.  Walking can help get rid of the air that was put into your GI tract during the procedure and reduce the bloating. If you had a lower endoscopy (such as a colonoscopy or flexible sigmoidoscopy) you may notice spotting of blood in your stool or on the toilet paper. If you underwent a bowel prep for your procedure, you may not have a normal bowel movement for a few days.  Please Note:  You might notice some irritation and congestion in your nose or some drainage.  This is from the oxygen used during your procedure.  There is no need for concern and it should clear up in a day or so.  SYMPTOMS TO REPORT IMMEDIATELY:   Following lower endoscopy (colonoscopy or flexible sigmoidoscopy):  Excessive amounts of blood in the stool  Significant tenderness or worsening of abdominal pains  Swelling of the abdomen that is new, acute  Fever of 100F or higher   For urgent or emergent issues, a gastroenterologist can be reached at any hour by calling [phone].   DIET: Your first meal following the procedure should be a small meal and then it is ok to progress to your normal diet. Heavy or fried foods are harder to digest and may make you feel nauseous or bloated.  Likewise, meals heavy in dairy and vegetables can increase bloating.  Drink plenty of fluids but you should avoid alcoholic beverages for 24  hours.  ACTIVITY:  You should plan to take it easy for the rest of today and you should NOT DRIVE or use heavy machinery until tomorrow (because of the sedation medicines used during the test).    FOLLOW UP: Our staff will call the number listed on your records the next business day following your procedure to check on you and address any questions or concerns that you may have regarding the information given to you following your procedure. If we do not reach you, we will leave a message.  However, if you are feeling well and you are not experiencing any problems, there is no need to return our call.  We will assume that you have returned to your regular daily activities without incident.  If any biopsies were taken you will be contacted by phone or by letter within the next 1-3 weeks.  Please call us at [phone] if you have not heard about the biopsies in 3 weeks.    SIGNATURES/CONFIDENTIALITY: You and/or your care partner have signed paperwork which will be entered into your electronic medical record.  These signatures attest to the fact that that the information above on your After Visit Summary has been reviewed and is understood.  Full responsibility of the confidentiality of this discharge information lies with you and/or your care-partner.  Please review diverticulosis and high fiber diet handouts provided. 

## 2016-06-15 NOTE — Op Note (Signed)
[place] Patient Name: Samuel Richmond Procedure Date: 06/15/2016 7:24 AM MRN: [id number] Endoscopist: Milus Banister , MD Age: 60 Referring MD:  Date of Birth: 09-01-1956 Gender: Male Account #: [id number] Procedure:                Colonoscopy Indications:              Screening for colorectal malignant neoplasm                            (colonoscopy 2007 Dr. Ardis Hughs; no polyps or cancers) Medicines:                Monitored Anesthesia Care Procedure:                Pre-Anesthesia Assessment:                           - Prior to the procedure, a History and Physical                            was performed, and patient medications and                            allergies were reviewed. The patient's tolerance of                            previous anesthesia was also reviewed. The risks                            and benefits of the procedure and the sedation                            options and risks were discussed with the patient.                            All questions were answered, and informed consent                            was obtained. Prior Anticoagulants: The patient has                            taken no previous anticoagulant or antiplatelet                            agents. ASA Grade Assessment: II - A patient with                            mild systemic disease. After reviewing the risks                            and benefits, the patient was deemed in                            satisfactory condition to undergo the procedure.  After obtaining informed consent, the colonoscope                            was passed under direct vision. Throughout the                            procedure, the patient's blood pressure, pulse, and                            oxygen saturations were monitored continuously. The                            Model CF-HQ190L [id number]) scope was introduced                            through the  anus and advanced to the the cecum,                            identified by appendiceal orifice and ileocecal                            valve. The colonoscopy was performed without                            difficulty. The patient tolerated the procedure                            well. The quality of the bowel preparation was                            excellent. The ileocecal valve, appendiceal                            orifice, and rectum were photographed. Scope In: 7:34:28 AM Scope Out: 7:45:42 AM Scope Withdrawal Time: 0 hours 7 minutes 40 seconds  Total Procedure Duration: 0 hours 11 minutes 14 seconds  Findings:                 A few medium-mouthed diverticula were found in the                            entire colon.                           Very mild radiation related proctitis.                           The exam was otherwise without abnormality on                            direct and retroflexion views. Complications:            No immediate complications. Estimated blood loss:                            None. Estimated Blood  Loss:     Estimated blood loss: none. Impression:               - Diverticulosis in the entire examined colon.                           - Very mild radiation related proctitis.                           - The examination was otherwise normal on direct                            and retroflexion views.                           - No polyps or cancers Recommendation:           - Patient has a contact number available for                            emergencies. The signs and symptoms of potential                            delayed complications were discussed with the                            patient. Return to normal activities tomorrow.                            Written discharge instructions were provided to the                            patient.                           - Resume previous diet.                           - Continue present  medications.                           - Repeat colonoscopy in 10 years for screening                            purposes. There is no need for colon cancer                            screening by any method (including stool testing)                            prior to then. Milus Banister, MD 06/15/2016 7:49:41 AM This report has been signed electronically.

## 2016-06-18 ENCOUNTER — Telehealth: Payer: Self-pay

## 2016-06-18 NOTE — Telephone Encounter (Signed)
  Follow up Call-  Call back number 06/15/2016  Post procedure Call Back phone  # [phone]  Permission to leave phone message Yes    Patient was called for follow up after his procedure on 06/15/2016. No answer at the number given for follow up phone call. A message was left on the answering machine.

## 2016-06-19 ENCOUNTER — Encounter: Payer: Self-pay | Admitting: Family Medicine

## 2016-06-19 ENCOUNTER — Ambulatory Visit (INDEPENDENT_AMBULATORY_CARE_PROVIDER_SITE_OTHER): Payer: BLUE CROSS/BLUE SHIELD | Admitting: Family Medicine

## 2016-06-19 VITALS — BP 120/70 | HR 72 | Temp 98.1°F | Resp 16 | Ht 72.0 in | Wt 188.1 lb

## 2016-06-19 DIAGNOSIS — Z1159 Encounter for screening for other viral diseases: Secondary | ICD-10-CM | POA: Diagnosis not present

## 2016-06-19 DIAGNOSIS — Z0000 Encounter for general adult medical examination without abnormal findings: Secondary | ICD-10-CM

## 2016-06-19 DIAGNOSIS — R7989 Other specified abnormal findings of blood chemistry: Secondary | ICD-10-CM

## 2016-06-19 LAB — HEPATIC FUNCTION PANEL
ALBUMIN: 4.4 g/dL (ref 3.5–5.2)
ALT: 20 U/L (ref 0–53)
AST: 18 U/L (ref 0–37)
Alkaline Phosphatase: 70 U/L (ref 39–117)
Bilirubin, Direct: 0.1 mg/dL (ref 0.0–0.3)
TOTAL PROTEIN: 6.6 g/dL (ref 6.0–8.3)
Total Bilirubin: 0.8 mg/dL (ref 0.2–1.2)

## 2016-06-19 LAB — BASIC METABOLIC PANEL
BUN: 11 mg/dL (ref 6–23)
CALCIUM: 9.3 mg/dL (ref 8.4–10.5)
CHLORIDE: 105 meq/L (ref 96–112)
CO2: 30 meq/L (ref 19–32)
Creatinine, Ser: 0.71 mg/dL (ref 0.40–1.50)
GFR: 119.38 mL/min (ref >60.00)
GLUCOSE: 90 mg/dL (ref 70–99)
POTASSIUM: 4.1 meq/L (ref 3.5–5.1)
SODIUM: 139 meq/L (ref 135–145)

## 2016-06-19 LAB — LIPID PANEL
Cholesterol: 173 mg/dL (ref 0–200)
HDL: 35.7 mg/dL — AB (ref >39.00)
NONHDL: 136.93
Total CHOL/HDL Ratio: 5
Triglycerides: 202 mg/dL — ABNORMAL HIGH (ref 0.0–149.0)
VLDL: 40.4 mg/dL — ABNORMAL HIGH (ref 0.0–40.0)

## 2016-06-19 LAB — PSA: PSA: 0.05 ng/mL — AB (ref 0.10–4.00)

## 2016-06-19 LAB — TSH: TSH: 2.4 u[IU]/mL (ref 0.35–4.50)

## 2016-06-19 LAB — LDL CHOLESTEROL, DIRECT: Direct LDL: 107 mg/dL

## 2016-06-19 NOTE — Patient Instructions (Signed)
Follow up in 1 year or as needed We'll notify you of your lab results and make any changes if needed Continue to work on healthy diet and regular exercise- you look great! You are up to date on colonoscopy, Tetanus, Shingles- yay! Call with any questions or concerns Have a great summer!!!

## 2016-06-19 NOTE — Assessment & Plan Note (Signed)
Pt's PE WNL.  UTD on colonoscopy, urology, Tdap, shingles.  Check labs.  Anticipatory guidance provided.

## 2016-06-19 NOTE — Progress Notes (Signed)
Pre visit review using our clinic review tool, if applicable. No additional management support is needed unless otherwise documented below in the visit note. 

## 2016-06-19 NOTE — Progress Notes (Signed)
   Subjective:    Patient ID: Samuel Richmond, male    DOB: 1956-07-18, 60 y.o.   MRN: [id number]  HPI CPE- UTD on colonoscopy, urology, Tdap.   Review of Systems Patient reports no vision/hearing changes, anorexia, fever ,adenopathy, persistant/recurrent hoarseness, swallowing issues, chest pain, palpitations, edema, persistant/recurrent cough, hemoptysis, dyspnea (rest,exertional, paroxysmal nocturnal), gastrointestinal  bleeding (melena, rectal bleeding), abdominal pain, excessive heart burn, GU symptoms (dysuria, hematuria, voiding/incontinence issues) syncope, focal weakness, memory loss, numbness & tingling, skin/hair/nail changes, depression, anxiety, abnormal bruising/bleeding, musculoskeletal symptoms/signs.     Objective:   Physical Exam General Appearance:    Alert, cooperative, no distress, appears stated age  Head:    Normocephalic, without obvious abnormality, atraumatic  Eyes:    PERRL, conjunctiva/corneas clear, EOM's intact, fundi    benign, both eyes       Ears:    Normal TM's and external ear canals, both ears  Nose:   Nares normal, septum midline, mucosa normal, no drainage   or sinus tenderness  Throat:   Lips, mucosa, and tongue normal; teeth and gums normal  Neck:   Supple, symmetrical, trachea midline, no adenopathy;       thyroid:  No enlargement/tenderness/nodules  Back:     Symmetric, no curvature, ROM normal, no CVA tenderness  Lungs:     Clear to auscultation bilaterally, respirations unlabored  Chest wall:    No tenderness or deformity  Heart:    Regular rate and rhythm, S1 and S2 normal, no murmur, rub   or gallop  Abdomen:     Soft, non-tender, bowel sounds active all four quadrants,    no masses, no organomegaly  Genitalia:    Deferred to urology  Rectal:    Extremities:   Extremities normal, atraumatic, no cyanosis or edema  Pulses:   2+ and symmetric all extremities  Skin:   Skin color, texture, turgor normal, no rashes or lesions  Lymph nodes:    Cervical, supraclavicular, and axillary nodes normal  Neurologic:   CNII-XII intact. Normal strength, sensation and reflexes      throughout          Assessment & Plan:

## 2016-06-20 LAB — CBC WITH DIFFERENTIAL/PLATELET
Basophils Absolute: 0 10*3/uL (ref 0.0–0.1)
Basophils Relative: 0.2 % (ref 0.0–3.0)
EOS ABS: 0.1 10*3/uL (ref 0.0–0.7)
Eosinophils Relative: 2.7 % (ref 0.0–5.0)
HCT: 41.5 % (ref 39.0–52.0)
HEMOGLOBIN: 14.1 g/dL (ref 13.0–17.0)
LYMPHS ABS: 0.9 10*3/uL (ref 0.7–4.0)
Lymphocytes Relative: 17 % (ref 12.0–46.0)
MCHC: 34.1 g/dL (ref 30.0–36.0)
MCV: 88.2 fl (ref 78.0–100.0)
MONO ABS: 0.3 10*3/uL (ref 0.1–1.0)
Monocytes Relative: 6.3 % (ref 3.0–12.0)
NEUTROS PCT: 73.8 % (ref 43.0–77.0)
Neutro Abs: 3.9 10*3/uL (ref 1.4–7.7)
Platelets: 240 10*3/uL (ref 150.0–400.0)
RBC: 4.71 Mil/uL (ref 4.22–5.81)
RDW: 13.7 % (ref 11.5–15.5)
WBC: 5.2 10*3/uL (ref 4.0–10.5)

## 2016-06-20 LAB — HEPATITIS C ANTIBODY: HCV AB: NEGATIVE

## 2016-08-16 ENCOUNTER — Other Ambulatory Visit: Payer: Self-pay | Admitting: Family Medicine

## 2016-11-15 DIAGNOSIS — L300 Nummular dermatitis: Secondary | ICD-10-CM | POA: Diagnosis not present

## 2016-11-15 DIAGNOSIS — L821 Other seborrheic keratosis: Secondary | ICD-10-CM | POA: Diagnosis not present

## 2016-11-19 ENCOUNTER — Other Ambulatory Visit: Payer: Self-pay | Admitting: Family Medicine

## 2017-03-07 DIAGNOSIS — H52223 Regular astigmatism, bilateral: Secondary | ICD-10-CM | POA: Diagnosis not present

## 2017-03-07 DIAGNOSIS — H524 Presbyopia: Secondary | ICD-10-CM | POA: Diagnosis not present

## 2017-03-07 DIAGNOSIS — H5203 Hypermetropia, bilateral: Secondary | ICD-10-CM | POA: Diagnosis not present

## 2017-04-22 DIAGNOSIS — C61 Malignant neoplasm of prostate: Secondary | ICD-10-CM | POA: Diagnosis not present

## 2017-04-24 DIAGNOSIS — C61 Malignant neoplasm of prostate: Secondary | ICD-10-CM | POA: Diagnosis not present

## 2017-05-31 ENCOUNTER — Other Ambulatory Visit: Payer: Self-pay | Admitting: Family Medicine

## 2017-06-01 ENCOUNTER — Other Ambulatory Visit: Payer: Self-pay | Admitting: Family Medicine

## 2017-09-04 ENCOUNTER — Other Ambulatory Visit: Payer: Self-pay | Admitting: Family Medicine

## 2017-09-04 NOTE — Telephone Encounter (Signed)
Reviewing in PCP absence. Ok to send in 30 with 0 of each. Patient needs appointment with PCP.

## 2017-09-04 NOTE — Telephone Encounter (Signed)
Noted, med filled #30. appt necessary documented on med. Called pt could not leave a voicemail.

## 2017-09-04 NOTE — Telephone Encounter (Signed)
Pt has not been seen in over a year, Please advise?

## 2017-09-18 DIAGNOSIS — J40 Bronchitis, not specified as acute or chronic: Secondary | ICD-10-CM | POA: Diagnosis not present

## 2017-09-26 ENCOUNTER — Encounter: Payer: Self-pay | Admitting: Family Medicine

## 2017-09-26 ENCOUNTER — Ambulatory Visit (INDEPENDENT_AMBULATORY_CARE_PROVIDER_SITE_OTHER): Payer: BLUE CROSS/BLUE SHIELD | Admitting: Family Medicine

## 2017-09-26 VITALS — BP 120/81 | HR 72 | Temp 98.1°F | Resp 16 | Ht 72.0 in | Wt 180.1 lb

## 2017-09-26 DIAGNOSIS — J439 Emphysema, unspecified: Secondary | ICD-10-CM | POA: Diagnosis not present

## 2017-09-26 DIAGNOSIS — F418 Other specified anxiety disorders: Secondary | ICD-10-CM | POA: Diagnosis not present

## 2017-09-26 MED ORDER — BUPROPION HCL ER (XL) 150 MG PO TB24: 150.0000 mg | ORAL_TABLET | Freq: Every day | ORAL | 1 refills | Status: DC

## 2017-09-26 MED ORDER — ESCITALOPRAM OXALATE 20 MG PO TABS: 20.0000 mg | ORAL_TABLET | Freq: Every day | ORAL | 1 refills | Status: DC

## 2017-09-26 NOTE — Progress Notes (Signed)
   Subjective:    Patient ID: Samuel Richmond, male    DOB: August 24, 1956, 61 y.o.   MRN: [id number]  HPI Bronchitis- pt was seen at [facility] onsite clinic on 10/17 and prescribed Zpack and 12 day pred taper.  Pt reports this is 'a pattern and I get sick every year for the last 18 yrs and I end up sick for an entire month'.  Pt admits that he has been 'doctor hunting for the last 20 yrs' looking for an answer regarding his lungs.  'I know I've got pulmonary problems'.  'my lungs haven't felt right for years'.  Hx of GERD, will only take Omeprazole prn.  Pt is reporting a 'body anxiety' a 'pulmonary awareness' that happens 'once or twice a year'.  Denies SOB w/ exertion or stair climbing.   Review of Systems For ROS see HPI     Objective:   Physical Exam  Constitutional: He is oriented to person, place, and time. He appears well-developed and well-nourished. No distress.  HENT:  Head: Normocephalic and atraumatic.  Cardiovascular: Normal rate, regular rhythm, normal heart sounds and intact distal pulses.   Pulmonary/Chest: Effort normal and breath sounds normal. No respiratory distress. He has no wheezes. He has no rales.  Neurological: He is alert and oriented to person, place, and time.  Skin: Skin is warm and dry.  Psychiatric:  Anxious, frustrated  Vitals reviewed.         Assessment & Plan:

## 2017-09-26 NOTE — Assessment & Plan Note (Signed)
New.  Reviewed CXR and CT reports from [place] in 2008 that reported hyperinflation and emphysema.  Pt has seen multiple doctors over the last 20 yrs looking for answers to his month long symptomatology that occurs 1-2x/year.  Reviewed Dr Gustavus Bryant note that recommended he return for evaluation during flares- pt has not scheduled an appt.  Will place stat referral in hopes he can get an appt while symptomatic.  Pt's lungs are CTAB, no crackles or wheezes.  Will hold on meds at this time as I am not sure what we are treating.  Pt expressed understanding and is in agreement w/ plan.

## 2017-09-26 NOTE — Patient Instructions (Signed)
Schedule your complete physical in 3-4 months We'll notify you of your pulmonary appt so you can be evaluated while you are symptomatic I sent refills on the Lexapro and Wellbutrin for you Call with any questions or concerns Hang in there!!!

## 2017-09-26 NOTE — Assessment & Plan Note (Signed)
Ongoing issue.  Pt reports sxs are well controlled on Lexapro and Wellbutrin but he is very anxious today.  Refills provided.

## 2017-09-30 ENCOUNTER — Ambulatory Visit (INDEPENDENT_AMBULATORY_CARE_PROVIDER_SITE_OTHER)
Admission: RE | Admit: 2017-09-30 | Discharge: 2017-09-30 | Disposition: A | Discharge status: Home / Self Care | Payer: BLUE CROSS/BLUE SHIELD | Source: Ambulatory Visit | Attending: Internal Medicine | Admitting: Internal Medicine

## 2017-09-30 ENCOUNTER — Ambulatory Visit (INDEPENDENT_AMBULATORY_CARE_PROVIDER_SITE_OTHER): Payer: BLUE CROSS/BLUE SHIELD | Admitting: Internal Medicine

## 2017-09-30 ENCOUNTER — Encounter: Payer: Self-pay | Admitting: Internal Medicine

## 2017-09-30 VITALS — BP 150/82 | HR 80 | Ht 72.0 in | Wt 182.0 lb

## 2017-09-30 DIAGNOSIS — R0600 Dyspnea, unspecified: Secondary | ICD-10-CM

## 2017-09-30 DIAGNOSIS — R0609 Other forms of dyspnea: Secondary | ICD-10-CM | POA: Diagnosis not present

## 2017-09-30 DIAGNOSIS — R918 Other nonspecific abnormal finding of lung field: Secondary | ICD-10-CM | POA: Diagnosis not present

## 2017-09-30 NOTE — Progress Notes (Signed)
Subjective:   Patient ID: Samuel Richmond, male    DOB: 05/05/1956,    MRN: [id number]    Brief patient profile:  69 yowm quit smoking 2000 with sob and cough and extensive w/u in this clinic and at [facility] "never got a dx" self referred back to this clinic 03/15/2016    History of Present Illness  03/15/2016 1st [facility] office visit/ Melrose Kearse   Chief Complaint  Patient presents with  . Pulmonary Consult    Self referral. Last seen here in 2006.  He states I have had breathing problems "for years" "I think I have Emphysema and my lung are hyperinflated".   since quit smoking in 200 always bothered by doe but limited due L knee / rides stationery bike x 15 min/ with episodes that last up to 2 months of refractory sob at rest and 80% better spont at time of ov / does not disturb sleep. Daytime cough pattern, not waking him up, cough dry  tends to be worse when breathing worse> no better on GERD Rx  Already tried on inhalers including foradil with no improvement and now on spiriva not sure it's doing anything to pattern of "months of sob then all better" for up to a year  rec Stop fish oil and spiriva for now  Try prilosec otc 20mg   Take 30-60 min before first meal of the day and Pepcid ac (famotidine) 20 mg one @  bedtime  Until / including day you return for a methacholine challenge test GERD (REFLUX)   Please see patient coordinator before you leave today  to schedule methacholine challenge test in two weeks no sooner > neg    04/23/2016  f/u ov/Josalyn Dettmann re: dyspnea/ cough >  no rx but ppi/h2hs Chief Complaint  Patient presents with  . Follow-up    Pt is here for PFT results. He would like to know how it compares to his previous PFT from [place]. Pt c/o occasional cough.    no regular ex but really Not limited by breathing from desired activities  rec You do not have significant copd or asthma at this point and it's unlikely therefore you ever will Continue the reflux medications and  avoid tums  In the event of flare of your symptoms >  prilosec 20 mg x 2 30 min before and supper and pepcid 20 mg at bedtime and pay close attention to the diet  GERD diet    09/30/2017  Extended f/u ov/Iriana Artley re: re-eval doe  Chief Complaint  Patient presents with  . Pulmonary Consult    Referred by Dr. Leretha Pol for eval of Emphysema.  Pt c/o increased SOB, cough and CP x 1 month.    this is a chronic complaint but much worse x one month following ? Samuel Germany in early oct 2018  rx 09/18/17 with pred/ zpak    but did not activate action plan for gerd as rec  above has ex bike but not using  Sleeping is fine with no phelgm congestion but some  dry cough daytime s specific triggers Allergy testing w/in the period of symptoms = 20 years/ nl but can't recall where/when done      No obvious day to day or daytime variability or assoc excess/ purulent sputum or mucus plugs or hemoptysis or cp or chest tightness, subjective wheeze or overt sinus or hb symptoms. No unusual exp hx or h/o childhood pna/ asthma or knowledge of premature birth.  Sleeping ok flat  without nocturnal  or early am exacerbation  of respiratory  c/o's or need for noct saba. Also denies any obvious fluctuation of symptoms with weather or environmental changes or other aggravating or alleviating factors except as outlined above   Current Allergies, Complete Past Medical History, Past Surgical History, Family History, and Social History were reviewed in [employer] record.  ROS  The following are not active complaints unless bolded Hoarseness, sore throat, dysphagia, dental problems, itching, sneezing,  nasal congestion or discharge of excess mucus or purulent secretions, ear ache,   fever, chills, sweats, unintended wt loss or wt gain, classically pleuritic or exertional cp,  orthopnea pnd or leg swelling, presyncope, palpitations, abdominal pain, anorexia, nausea, vomiting, diarrhea  or change in bowel  habits or change in bladder habits, change in stools or change in urine, dysuria, hematuria,  rash, arthralgias, visual complaints, headache, numbness, weakness or ataxia or problems with walking or coordination,  change in mood/affect or memory.        Current Meds  Medication Sig  . buPROPion (WELLBUTRIN XL) 150 MG 24 hr tablet Take 1 tablet (150 mg total) by mouth daily.  [name] escitalopram (LEXAPRO) 20 MG tablet Take 1 tablet (20 mg total) by mouth daily.                               Objective:   Physical Exam  amb wm nad    09/30/2017     182  04/23/2016       190  03/15/16 191 lb 9.6 oz (86.909 kg)  09/30/15 187 lb (84.823 kg)  07/05/15 181 lb 3.2 oz (82.192 kg)    Vital signs reviewed  - Note on arrival 02 sats  100% on RA and bp 150/82     HEENT: nl dentition, turbinates, and oropharynx. Nl external ear canals without cough reflex   NECK :  without JVD/Nodes/TM/ nl carotid upstrokes bilaterally   LUNGS: no acc muscle use,  Nl contour chest which is clear to A and P bilaterally without cough on insp or exp maneuvers   CV:  RRR  no s3 or murmur or increase in P2, no edema   ABD:  soft and nontender with nl inspiratory excursion in the supine position. No bruits or organomegaly, bowel sounds nl  MS:  Nl gait/ ext warm without deformities, calf tenderness, cyanosis or clubbing No obvious joint restrictions   SKIN: warm and dry without lesions    NEURO:  alert, approp, nl sensorium with  no motor deficits      CXR PA and Lateral:   09/30/2017 :    I personally reviewed images and agree with radiology impression as follows:    Hyperinflation.  No active disease.  Image reviews show no significant emphysema on ct chest 06/08/15        Assessment & Plan:

## 2017-09-30 NOTE — Patient Instructions (Addendum)
To get the most out of exercise, you need to be continuously aware that you are short of breath, but never out of breath, for 30 minutes daily. As you improve, it will actually be easier for you to do the same amount of exercise  in  30 minutes so always push to the level where you are short of breath.  If this does not result in improvement in your breathing over 2 weeks call for CPST   If the throat issue continues to be a problem, call for referral to Dr Joya Gaskins at [facility]   In the event of flare of your symptoms >  prilosec 20 mg x 2 30 min before and supper and pepcid 20 mg at bedtime and pay close attention to the diet   Please remember to go to the  x-ray department downstairs in the basement  for your tests - we will call you with the results when they are available.    If you are satisfied with your treatment plan,  let your doctor know and he/she can either refill your medications or you can return here when your prescription runs out.     If in any way you are not 100% satisfied,  please tell us.  If 100% better, tell your friends!  Pulmonary follow up is as needed

## 2017-10-01 NOTE — Assessment & Plan Note (Addendum)
[place] note 2009 / pulmonary:  Dx asthma/gerd "well controlled" with anxiety component rec clonazepam > pt denied at ov  PFT's  12/23/2009   FEV1 2.97 (76 % ) ratio 69   with DLCO  84 %    .03/15/2016  Denied any medication helping his symptoms and only change noted from meds  Was adverse =  irritation from ICS  - Try off spiriva 03/15/2016 > no worse   - Methacholine challenge test 03/29/16 > neg for asthma with FEV1 2.89 with ratio 69  - Spirometry 09/30/2017  FEV1 2.66 (71%)  Ratio 69 with mild curvature on no rx    Symptoms are markedly disproportionate to objective findings and not clear this is actually much of a  lung problem but pt does appear to have difficult to sort out respiratory symptoms of unknown origin for which  DDX  = almost all start with A and  include Adherence, Ace Inhibitors, Acid Reflux, Active Sinus Disease, Alpha 1 Antitripsin deficiency, Anxiety masquerading as Airways dz,  ABPA,  Allergy(esp in young), Aspiration (esp in elderly), Adverse effects of meds,  Active smokers, A bunch of PE's (a small clot burden can't cause this syndrome unless there is already severe underlying pulm or vascular dz with poor reserve) plus two Bs  = Bronchiectasis and Beta blocker use..and one C= CHF     Adherence is always the initial "prime suspect" and is a multilayered concern that requires a "trust but verify" approach in every patient - starting with knowing how to use medications, especially inhalers, correctly, keeping up with refills and understanding the fundamental difference between maintenance and prns vs those medications only taken for a very short course and then stopped and not refilled.  - note he does not comply with recs for gerd rx, even short term, though this is the best way in event of flare to prevent cyclical cough - noting he already has a mild chronic uacs typical of pt with low grade gerd  Of the three most common causes of  Sub-acute or recurrent or chronic cough, only  one (GERD)  can actually contribute to/ trigger  the other two (asthma and post nasal drip syndrome)  and perpetuate the cylce of cough.  While not intuitively obvious, many patients with chronic low grade reflux do not cough until there is a primary insult that disturbs the protective epithelial barrier and exposes sensitive nerve endings.   This is typically viral but can be direct physical injury such as with an endotracheal tube.   The point is that once this occurs, it is difficult to eliminate the cycle  using anything but a maximally effective acid suppression regimen at least in the short run, accompanied by an appropriate diet to address non acid GERD and control / eliminate the cough itself for at least 3 days.    ? Anxiety > usually at the bottom of this list of usual suspects but should be much higher on this pt's based on H and P and note already on psychotropics .    ? Allergy/ asthma > neg mct as above, neg allergy testing during time frame of symptoms rules strongly against   ? CHF/  Component of diastolic dysfunction as does have borderline hbp hx and again today slt elevated > Follow up per Primary Care planned     Best way to sort this out is regular submax ex x min of 2 weeks then proceed with cpst if not improving  Discussed in detail all the  indications, usual  risks and alternatives  relative to the benefits with patient who agrees to proceed with conservative f/u as outlined     I had an extended discussion with the patient reviewing all relevant studies completed to date and  lasting 25 minutes of a 40  minute office visit to re-establish  re  severe non-specific but potentially very serious refractory respiratory symptoms of uncertain and potentially multiple  etiologies.   Each maintenance medication was reviewed in detail including most importantly the difference between maintenance and prns and under what circumstances the prns are to be triggered using an action  plan format that is not reflected in the computer generated alphabetically organized AVS.    Please see AVS for specific instructions unique to this office visit that I personally wrote and verbalized to the the pt in detail and then reviewed with pt  by my nurse highlighting any changes in therapy/plan of care  recommended at today's visit.

## 2017-10-01 NOTE — Progress Notes (Signed)
Spoke with pt and notified of results per Dr. Wert. Pt verbalized understanding and denied any questions. 

## 2017-11-21 DIAGNOSIS — D225 Melanocytic nevi of trunk: Secondary | ICD-10-CM | POA: Diagnosis not present

## 2017-11-21 DIAGNOSIS — L821 Other seborrheic keratosis: Secondary | ICD-10-CM | POA: Diagnosis not present

## 2017-11-21 DIAGNOSIS — L820 Inflamed seborrheic keratosis: Secondary | ICD-10-CM | POA: Diagnosis not present

## 2017-11-21 DIAGNOSIS — Z808 Family history of malignant neoplasm of other organs or systems: Secondary | ICD-10-CM | POA: Diagnosis not present

## 2017-11-21 DIAGNOSIS — L308 Other specified dermatitis: Secondary | ICD-10-CM | POA: Diagnosis not present

## 2017-11-28 DIAGNOSIS — C61 Malignant neoplasm of prostate: Secondary | ICD-10-CM | POA: Diagnosis not present

## 2017-12-04 DIAGNOSIS — C61 Malignant neoplasm of prostate: Secondary | ICD-10-CM | POA: Diagnosis not present

## 2017-12-30 ENCOUNTER — Encounter: Payer: Self-pay | Admitting: Family Medicine

## 2017-12-30 ENCOUNTER — Ambulatory Visit (INDEPENDENT_AMBULATORY_CARE_PROVIDER_SITE_OTHER): Payer: BLUE CROSS/BLUE SHIELD | Admitting: Family Medicine

## 2017-12-30 ENCOUNTER — Other Ambulatory Visit: Payer: Self-pay

## 2017-12-30 VITALS — BP 112/70 | HR 78 | Temp 98.0°F | Resp 16 | Ht 72.0 in | Wt 180.2 lb

## 2017-12-30 DIAGNOSIS — I2510 Atherosclerotic heart disease of native coronary artery without angina pectoris: Secondary | ICD-10-CM

## 2017-12-30 DIAGNOSIS — Z0000 Encounter for general adult medical examination without abnormal findings: Secondary | ICD-10-CM

## 2017-12-30 LAB — CBC WITH DIFFERENTIAL/PLATELET
Basophils Absolute: 0 10*3/uL (ref 0.0–0.1)
Basophils Relative: 0.2 % (ref 0.0–3.0)
EOS ABS: 0.1 10*3/uL (ref 0.0–0.7)
Eosinophils Relative: 1.4 % (ref 0.0–5.0)
HCT: 46.1 % (ref 39.0–52.0)
HEMOGLOBIN: 15.9 g/dL (ref 13.0–17.0)
Lymphocytes Relative: 20.2 % (ref 12.0–46.0)
Lymphs Abs: 1.2 10*3/uL (ref 0.7–4.0)
MCHC: 34.6 g/dL (ref 30.0–36.0)
MCV: 88.3 fl (ref 78.0–100.0)
Monocytes Absolute: 0.5 10*3/uL (ref 0.1–1.0)
Monocytes Relative: 8.6 % (ref 3.0–12.0)
NEUTROS PCT: 69.6 % (ref 43.0–77.0)
Neutro Abs: 4.1 10*3/uL (ref 1.4–7.7)
Platelets: 303 10*3/uL (ref 150.0–400.0)
RBC: 5.22 Mil/uL (ref 4.22–5.81)
RDW: 13 % (ref 11.5–15.5)
WBC: 5.9 10*3/uL (ref 4.0–10.5)

## 2017-12-30 LAB — LIPID PANEL
CHOL/HDL RATIO: 5
CHOLESTEROL: 224 mg/dL — AB (ref 0–200)
HDL: 46.3 mg/dL (ref >39.00)
NonHDL: 177.65
TRIGLYCERIDES: 282 mg/dL — AB (ref 0.0–149.0)
VLDL: 56.4 mg/dL — AB (ref 0.0–40.0)

## 2017-12-30 LAB — BASIC METABOLIC PANEL
BUN: 15 mg/dL (ref 6–23)
CALCIUM: 9.7 mg/dL (ref 8.4–10.5)
CO2: 31 mEq/L (ref 19–32)
CREATININE: 1.08 mg/dL (ref 0.40–1.50)
Chloride: 101 mEq/L (ref 96–112)
GFR: 73.21 mL/min (ref >60.00)
Glucose, Bld: 91 mg/dL (ref 70–99)
Potassium: 4.2 mEq/L (ref 3.5–5.1)
Sodium: 142 mEq/L (ref 135–145)

## 2017-12-30 LAB — TSH: TSH: 1.91 u[IU]/mL (ref 0.35–4.50)

## 2017-12-30 LAB — HEPATIC FUNCTION PANEL
ALBUMIN: 4.8 g/dL (ref 3.5–5.2)
ALT: 29 U/L (ref 0–53)
AST: 27 U/L (ref 0–37)
Alkaline Phosphatase: 71 U/L (ref 39–117)
Bilirubin, Direct: 0.2 mg/dL (ref 0.0–0.3)
TOTAL PROTEIN: 7.2 g/dL (ref 6.0–8.3)
Total Bilirubin: 1.5 mg/dL — ABNORMAL HIGH (ref 0.2–1.2)

## 2017-12-30 LAB — LDL CHOLESTEROL, DIRECT: LDL DIRECT: 128 mg/dL

## 2017-12-30 NOTE — Assessment & Plan Note (Signed)
Pt's PE WNL.  UTD on colonoscopy and immunizations.  He is going to check w/ his insurance company before getting his Shingrix.  Check labs.  Anticipatory guidance provided.

## 2017-12-30 NOTE — Progress Notes (Signed)
   Subjective:    Patient ID: Samuel Richmond, male    DOB: [birth year], 62 y.o.   MRN: [id number]  HPI CPE- UTD on colonoscopy, urology, Tdap, flu.  No concerns today.   Review of Systems Patient reports no vision/hearing changes, anorexia, fever ,adenopathy, persistant/recurrent hoarseness, swallowing issues, chest pain, palpitations, edema, persistant/recurrent cough, hemoptysis, dyspnea (rest,exertional, paroxysmal nocturnal), gastrointestinal  bleeding (melena, rectal bleeding), abdominal pain, excessive heart burn, GU symptoms (dysuria, hematuria, voiding/incontinence issues) syncope, focal weakness, memory loss, numbness & tingling, skin/hair/nail changes, depression, anxiety, abnormal bruising/bleeding, musculoskeletal symptoms/signs.     Objective:   Physical Exam General Appearance:    Alert, cooperative, no distress, appears stated age  Head:    Normocephalic, without obvious abnormality, atraumatic  Eyes:    PERRL, conjunctiva/corneas clear, EOM's intact, fundi    benign, both eyes       Ears:    Normal TM's and external ear canals, both ears  Nose:   Nares normal, septum midline, mucosa normal, no drainage   or sinus tenderness  Throat:   Lips, mucosa, and tongue normal; teeth and gums normal  Neck:   Supple, symmetrical, trachea midline, no adenopathy;       thyroid:  No enlargement/tenderness/nodules  Back:     Symmetric, no curvature, ROM normal, no CVA tenderness  Lungs:     Clear to auscultation bilaterally, respirations unlabored  Chest wall:    No tenderness or deformity  Heart:    Regular rate and rhythm, S1 and S2 normal, no murmur, rub   or gallop  Abdomen:     Soft, non-tender, bowel sounds active all four quadrants,    no masses, no organomegaly  Genitalia:    Deferred to urology  Rectal:    Extremities:   Extremities normal, atraumatic, no cyanosis or edema  Pulses:   2+ and symmetric all extremities  Skin:   Skin color, texture, turgor normal, no rashes or  lesions  Lymph nodes:   Cervical, supraclavicular, and axillary nodes normal  Neurologic:   CNII-XII intact. Normal strength, sensation and reflexes      throughout          Assessment & Plan:

## 2017-12-30 NOTE — Assessment & Plan Note (Signed)
Pt has evidence of CAD on CT scan.  Check labs to risk stratify.  Will follow.

## 2017-12-30 NOTE — Patient Instructions (Signed)
Follow up in 1 year or as needed We'll notify you of your lab results and make any changes if needed Call and ask your insurance company about coverage for the Shingrix.  If covered, we can schedule a nurse visit at any time Keep up the good work on healthy diet and regular exercise- you look great! Call with any questions or concerns Happy New Year!!

## 2017-12-31 ENCOUNTER — Encounter: Payer: Self-pay | Admitting: Family Medicine

## 2017-12-31 ENCOUNTER — Other Ambulatory Visit: Payer: Self-pay | Admitting: Family Medicine

## 2017-12-31 DIAGNOSIS — E782 Mixed hyperlipidemia: Secondary | ICD-10-CM

## 2017-12-31 MED ORDER — SIMVASTATIN 20 MG PO TABS: 20.0000 mg | ORAL_TABLET | Freq: Every day | ORAL | 1 refills | Status: DC

## 2018-01-03 DIAGNOSIS — M720 Palmar fascial fibromatosis [Dupuytren]: Secondary | ICD-10-CM | POA: Diagnosis not present

## 2018-01-11 DIAGNOSIS — J101 Influenza due to other identified influenza virus with other respiratory manifestations: Secondary | ICD-10-CM | POA: Diagnosis not present

## 2018-02-24 ENCOUNTER — Other Ambulatory Visit: Payer: Self-pay | Admitting: Family Medicine

## 2018-02-24 DIAGNOSIS — E782 Mixed hyperlipidemia: Secondary | ICD-10-CM

## 2018-02-25 ENCOUNTER — Encounter: Payer: Self-pay | Admitting: Family Medicine

## 2018-02-27 ENCOUNTER — Other Ambulatory Visit: Payer: Self-pay | Admitting: Family Medicine

## 2018-02-27 DIAGNOSIS — R972 Elevated prostate specific antigen [PSA]: Secondary | ICD-10-CM

## 2018-02-27 DIAGNOSIS — Z125 Encounter for screening for malignant neoplasm of prostate: Secondary | ICD-10-CM

## 2018-02-28 ENCOUNTER — Other Ambulatory Visit (INDEPENDENT_AMBULATORY_CARE_PROVIDER_SITE_OTHER): Payer: BLUE CROSS/BLUE SHIELD

## 2018-02-28 DIAGNOSIS — R972 Elevated prostate specific antigen [PSA]: Secondary | ICD-10-CM | POA: Diagnosis not present

## 2018-02-28 DIAGNOSIS — Z125 Encounter for screening for malignant neoplasm of prostate: Secondary | ICD-10-CM | POA: Diagnosis not present

## 2018-02-28 DIAGNOSIS — E782 Mixed hyperlipidemia: Secondary | ICD-10-CM

## 2018-02-28 LAB — HEPATIC FUNCTION PANEL
ALBUMIN: 4.3 g/dL (ref 3.5–5.2)
ALT: 27 U/L (ref 0–53)
AST: 21 U/L (ref 0–37)
Alkaline Phosphatase: 67 U/L (ref 39–117)
Bilirubin, Direct: 0.2 mg/dL (ref 0.0–0.3)
Total Bilirubin: 1.6 mg/dL — ABNORMAL HIGH (ref 0.2–1.2)
Total Protein: 6.8 g/dL (ref 6.0–8.3)

## 2018-02-28 LAB — PSA: PSA: 1.02 ng/mL (ref 0.10–4.00)

## 2018-03-06 ENCOUNTER — Encounter: Payer: Self-pay | Admitting: Oncology

## 2018-03-06 ENCOUNTER — Telehealth: Payer: Self-pay | Admitting: Oncology

## 2018-03-06 NOTE — Telephone Encounter (Signed)
Appt has been scheduled for the pt to see Dr. Alen Blew on 4/30 at 11am. Pt aware to arrive 30 minutes early. Letter mailed.

## 2018-03-07 DIAGNOSIS — C61 Malignant neoplasm of prostate: Secondary | ICD-10-CM | POA: Diagnosis not present

## 2018-03-26 ENCOUNTER — Telehealth: Payer: Self-pay | Admitting: *Deleted

## 2018-03-26 ENCOUNTER — Encounter: Payer: Self-pay | Admitting: Oncology

## 2018-03-26 NOTE — Telephone Encounter (Signed)
Patient stated he had 2 very different lab results for his last PSA's and wondered if he needed another PSA drawn? He may discuss this with dr Alen Blew on his new patient visit 04/01/18

## 2018-03-26 NOTE — Telephone Encounter (Signed)
Will discuss next visit

## 2018-04-01 ENCOUNTER — Telehealth: Payer: Self-pay | Admitting: Oncology

## 2018-04-01 ENCOUNTER — Inpatient Hospital Stay: Payer: BLUE CROSS/BLUE SHIELD | Attending: Oncology | Admitting: Oncology

## 2018-04-01 ENCOUNTER — Encounter: Payer: Self-pay | Admitting: Medical Oncology

## 2018-04-01 VITALS — BP 119/68 | HR 61 | Temp 98.8°F | Resp 18 | Ht 72.0 in | Wt 184.9 lb

## 2018-04-01 DIAGNOSIS — C61 Malignant neoplasm of prostate: Secondary | ICD-10-CM | POA: Diagnosis not present

## 2018-04-01 DIAGNOSIS — Z923 Personal history of irradiation: Secondary | ICD-10-CM | POA: Diagnosis not present

## 2018-04-01 DIAGNOSIS — Z87891 Personal history of nicotine dependence: Secondary | ICD-10-CM | POA: Diagnosis not present

## 2018-04-01 DIAGNOSIS — J449 Chronic obstructive pulmonary disease, unspecified: Secondary | ICD-10-CM | POA: Diagnosis not present

## 2018-04-01 NOTE — Telephone Encounter (Signed)
Appointments scheduled AVS/Calendar printed per 4/30 los °

## 2018-04-01 NOTE — Progress Notes (Signed)
Reason for Referral: Prostate cancer  HPI: 62 year old gentleman currently of [place] but also lived in multiple locations prior to moving this area in the 1990s.  He was diagnosed with prostate cancer in 2014.  At that time he underwent a radical prostatectomy and bilateral lymphadenectomy on March 26, 2013 under the care of Dr. Alinda Money the final pathology showed prostate cancer Gleason score 4+3 = 7 involves both lobes with focal extraprostatic extension noted.  No evidence of seminal vesicle involvement and 0 out of 9 lymph nodes involved.  The final pathological staging was [id number].  He developed biochemical relapse in November 2017 with a PSA of 0.17.  He received radiation therapy under the care of Dr. Valere Dross between January and March 2016 with undetectable PSA after that.  His PSA has been slowly rising at this time with his most recent PSA on April 5 was 0.76.  In December 2018 his PSA was 0.68.  He did have a PSA done by his primary care physician on 02/25/2018 and it was 1.02.  His PSA in May 2018 was 0.19 and in May 2015 was 0.10.  He is completely asymptomatic from these findings.  He does not report any incontinence or hematuria.  He denies any decline in his performance status or activity level.  He does not report any headaches, blurry vision, syncope or seizures. Does not report any fevers, chills or sweats.  Does not report any cough, wheezing or hemoptysis.  Does not report any chest pain, palpitation, orthopnea or leg edema.  Does not report any nausea, vomiting or abdominal pain.  Does not report any constipation or diarrhea.  Does not report any skeletal complaints.    Does not report frequency, urgency or hematuria.  Does not report any skin rashes or lesions. Does not report any heat or cold intolerance.  Does not report any lymphadenopathy or petechiae.  Does not report any anxiety or depression.  Remaining review of systems is negative.    Past Medical History:  Diagnosis Date  .  Allergy   . Anxiety   . Arthritis   . Cancer [facility])    prostate  . COPD (chronic obstructive pulmonary disease) (HCC)    mild; denies SOB with ADLs; no current med.  . Dental crowns present   . Depression   . Dyslipidemia    no current med.  [name] GERD (gastroesophageal reflux disease)    no current med.  [name] History of prostate cancer   . Pulmonary nodules    "stable", per CT chest 06/08/2015  . S/P radiation therapy 12/15/2014 - 02/01/2015        . Stenosing tenosynovitis of finger 09/2015   bilateral index finger  :  Past Surgical History:  Procedure Laterality Date  . COLONOSCOPY  05-08-06   normal  . HYDROCELE EXCISION Right 03/30/2004  . INGUINAL HERNIA REPAIR Left   . INGUINAL HERNIA REPAIR Right 03/30/2004  . KNEE ARTHROSCOPY Bilateral    multiple   . LYMPHADENECTOMY Bilateral 03/26/2013   Procedure: LYMPHADENECTOMY;  Surgeon: Dutch Gray, MD;  Location: [facility];  Service: Urology;  Laterality: Bilateral;  . NASAL CONCHA BULLOSA RESECTION Right 08/01/2000  . NASAL TURBINATE REDUCTION  08/01/2000  . ROBOT ASSISTED LAPAROSCOPIC RADICAL PROSTATECTOMY Bilateral 03/26/2013   Procedure: ROBOTIC ASSISTED LAPAROSCOPIC RADICAL PROSTATECTOMY LEVEL 2;  Surgeon: Dutch Gray, MD;  Location: [facility];  Service: Urology;  Laterality: Bilateral;  3 HRS   . SEPTOPLASTY  08/01/2000  . TOTAL KNEE ARTHROPLASTY Left 07/15/2013  Dr Percell Miller  . TOTAL KNEE ARTHROPLASTY Left 07/15/2013   Procedure: TOTAL KNEE ARTHROPLASTY;  Surgeon: Ninetta Lights, MD;  Location: [place];  Service: Orthopedics;  Laterality: Left;  . TRIGGER FINGER RELEASE  09/04/2012   Procedure: MINOR RELEASE TRIGGER FINGER/A-1 PULLEY;  Surgeon: Cammie Sickle., MD;  Location: [place];  Service: Orthopedics;  Laterality: Left;  left long and cyst excision left long  . TRIGGER FINGER RELEASE Bilateral 12/09/2014   Procedure: RELEASE A-1 PULLEY RIGHT MIDDLE  FINGER,RELEASE A-1 PULLEY LEFT RING FINGER;  Surgeon: Daryll Brod, MD;  Location: [place];  Service: Orthopedics;  Laterality: Bilateral;  . TRIGGER FINGER RELEASE Bilateral 09/30/2015   Procedure: RELEASE TRIGGER FINGER/A-1 PULLEY BILATERAL INDEX FINGERS;  Surgeon: Daryll Brod, MD;  Location: [place];  Service: Orthopedics;  Laterality: Bilateral;  :   Current Outpatient Medications:  .  buPROPion (WELLBUTRIN XL) 150 MG 24 hr tablet, Take 1 tablet (150 mg total) by mouth daily., Disp: 90 tablet, Rfl: 1 .  escitalopram (LEXAPRO) 20 MG tablet, Take 1 tablet (20 mg total) by mouth daily., Disp: 90 tablet, Rfl: 1 .  simvastatin (ZOCOR) 20 MG tablet, TAKE 1 TABLET BY MOUTH EVERYDAY AT BEDTIME, Disp: 30 tablet, Rfl: 6:  Allergies  Allergen Reactions  . Levaquin [Levofloxacin] Other (See Comments)    LEG CRAMPS, CHILLS  :  Family History  Problem Relation Age of Onset  . Prostate cancer Father   . Rheumatic fever Paternal Grandmother   . Hyperlipidemia Sister   . Colon cancer Neg Hx   . Colon polyps Neg Hx   . Esophageal cancer Neg Hx   . Rectal cancer Neg Hx   . Stomach cancer Neg Hx   :  Social History   Socioeconomic History  . Marital status: Married    Spouse name: Not on file  . Number of children: 1  . Years of education: Not on file  . Highest education level: Not on file  Occupational History    Employer: [employer]  Social Needs  . Financial resource strain: Not on file  . Food insecurity:    Worry: Not on file    Inability: Not on file  . Transportation needs:    Medical: Not on file    Non-medical: Not on file  Tobacco Use  . Smoking status: Former Smoker    Packs/day: 1.00    Years: 20.00    Pack years: 20.00    Types: Cigarettes    Last attempt to quit: 12/03/1998    Years since quitting: 19.3  . Smokeless tobacco: Never Used  Substance and Sexual Activity  . Alcohol use: No    Alcohol/week: 0.0 oz  . Drug use: No  .  Sexual activity: Not on file  Lifestyle  . Physical activity:    Days per week: Not on file    Minutes per session: Not on file  . Stress: Not on file  Relationships  . Social connections:    Talks on phone: Not on file    Gets together: Not on file    Attends religious service: Not on file    Active member of club or organization: Not on file    Attends meetings of clubs or organizations: Not on file    Relationship status: Not on file  . Intimate partner violence:    Fear of current or ex partner: Not on file    Emotionally abused: Not on file  Physically abused: Not on file    Forced sexual activity: Not on file  Other Topics Concern  . 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 rate 18, height 6' (1.829 m), weight 184 lb 14.4 oz (83.9 kg), SpO2 100 %.  ECOG 0 General appearance: alert and cooperative appeared without distress. Head: atraumatic without any abnormalities. Eyes: conjunctivae/corneas clear. PERRL.  Sclera anicteric. Throat: lips, mucosa, and tongue normal; without oral thrush or ulcers. Resp: clear to auscultation bilaterally without rhonchi, wheezes or dullness to percussion. Cardio: regular rate and rhythm, S1, S2 normal, no murmur, click, rub or gallop GI: soft, non-tender; bowel sounds normal; no masses,  no organomegaly Skin: Skin color, texture, turgor normal. No rashes or lesions Lymph nodes: Cervical, supraclavicular, and axillary nodes normal. Neurologic: Grossly normal without any motor, sensory or deep tendon reflexes. Musculoskeletal: No joint deformity or effusion.  CBC    Component Value Date/Time   WBC 5.9 12/30/2017 1503   RBC 5.22 12/30/2017 1503   HGB 15.9 12/30/2017 1503   HCT 46.1 12/30/2017 1503   PLT 303.0 12/30/2017 1503   MCV  88.3 12/30/2017 1503   MCH 29.8 07/17/2013 0610   MCHC 34.6 12/30/2017 1503   RDW 13.0 12/30/2017 1503   LYMPHSABS 1.2 12/30/2017 1503   MONOABS 0.5 12/30/2017 1503   EOSABS 0.1 12/30/2017 1503   BASOSABS 0.0 12/30/2017 1503     Chemistry      Component Value Date/Time   NA 142 12/30/2017 1503   NA 139 05/17/2015   K 4.2 12/30/2017 1503   CL 101 12/30/2017 1503   CO2 31 12/30/2017 1503   BUN 15 12/30/2017 1503   BUN 16 05/17/2015   CREATININE 1.08 12/30/2017 1503   GLU 89 05/17/2015      Component Value Date/Time   CALCIUM 9.7 12/30/2017 1503   ALKPHOS 67 02/28/2018 0753   AST 21 02/28/2018 0753   ALT 27 02/28/2018 0753   BILITOT 1.6 (H) 02/28/2018 0753       Assessment and Plan:   62 year old gentleman with the following issues:  1.  Prostate cancer diagnosed in 2014.  He presented today with a Gleason score 4+3 = 7 and a PSA of 5.33.  He underwent a radical prostatectomy and lymphadenectomy done by Dr. Alinda Money in April 2014.  He developed biochemical relapse in 2015 and received salvage radiation therapy and developed an detectable PSA after that.  He is developing biochemical relapse with his PSA in April 2015 was 0.76 and in December 2018 was 0.68.  He is asymptomatic at this time without any recent imaging studies done.  The natural course of this disease was discussed today with the patient in detail as well as future treatment options.  I concur with Dr. Alinda Money from a management standpoint that observation and surveillance is the best approach at this time.  I see no indication for treatment unless he develops a rapid rise in his PSA with a doubling time of less than 6 months or a measurable disease.  We have discussed different to this imaging modalities including CT scan, bone scan as well as Axumin PET imaging in addition to newer imaging modalities that are potentially to be approved in the future.  We have also discussed  the role of hormone therapy as well as  oligo metastatic disease treatment with repeat surgery and possible radiation therapy if he does develop isolated metastatic disease.  After discussion today, the plan is to repeat his PSA in 3 months and consider repeat imaging studies at the time of his PSA is in whole numbers.  Future treatment options were reviewed again and will be addressed depending on the results of his PSA in the future.  2.  Follow-up: He desires to have his PSA repeated here but no MD follow-up is scheduled at this time depending what his future PSA shows.  All his questions today were answered to his satisfaction.  60  minutes was spent with the patient face-to-face today.  More than 50% of time was dedicated to patient counseling, education and coordination of his care.

## 2018-04-01 NOTE — Progress Notes (Signed)
Introduced myself to Samuel Richmond as the prostate nurse navigator and my role. He had robotic prostatectomy 2014, in late 2015 had a biochemical recurrence and received radiation. In late 2018 his PSA became detectable and has slowly been rising. The watch and wait has been very hard for him and he requested a second opinion with Dr. Alen Blew. I followed up after his visit and Dr. Alen Blew recommends he continue active surveillance with PSA check in 3 months. He has chosen to have his PSA drawn here at [facility]. I asked him to call me with questions or concerns.

## 2018-04-21 ENCOUNTER — Other Ambulatory Visit: Payer: Self-pay | Admitting: Family Medicine

## 2018-06-10 ENCOUNTER — Inpatient Hospital Stay: Payer: BLUE CROSS/BLUE SHIELD | Attending: Oncology

## 2018-06-10 DIAGNOSIS — C61 Malignant neoplasm of prostate: Secondary | ICD-10-CM | POA: Diagnosis not present

## 2018-06-11 LAB — PROSTATE-SPECIFIC AG, SERUM (LABCORP): Prostate Specific Ag, Serum: 1.3 ng/mL (ref 0.0–4.0)

## 2018-06-12 ENCOUNTER — Encounter: Payer: Self-pay | Admitting: Oncology

## 2018-06-12 ENCOUNTER — Telehealth: Payer: Self-pay

## 2018-06-12 NOTE — Telephone Encounter (Signed)
Pt called and left VM requesting PSA result. Per Dr. Alen Blew, called pt and LVM that PSA came back at 1.3.

## 2018-06-13 ENCOUNTER — Other Ambulatory Visit: Payer: Self-pay | Admitting: Oncology

## 2018-06-13 ENCOUNTER — Telehealth: Payer: Self-pay

## 2018-06-13 DIAGNOSIS — C61 Malignant neoplasm of prostate: Secondary | ICD-10-CM

## 2018-06-13 NOTE — Telephone Encounter (Signed)
Pt sent MyChart message wondering if he would need to had axumin scan since PSA is now greater than 1.0. Dr. Alen Blew placed order and radiology to schedule. Called pt to let him know that he should expect a call to schedule his appt. Pt verbalized that he had already been called and his appt scheduled.

## 2018-06-17 ENCOUNTER — Encounter: Payer: Self-pay | Admitting: Oncology

## 2018-06-18 ENCOUNTER — Other Ambulatory Visit: Payer: Self-pay | Admitting: Oncology

## 2018-06-18 ENCOUNTER — Telehealth: Payer: Self-pay | Admitting: *Deleted

## 2018-06-18 DIAGNOSIS — C61 Malignant neoplasm of prostate: Secondary | ICD-10-CM

## 2018-06-18 NOTE — Telephone Encounter (Signed)
Spoke with patient. Per dr Hazeline Junker last o.v. Note he will see patient  Based on his next scan result.

## 2018-06-20 ENCOUNTER — Other Ambulatory Visit (HOSPITAL_COMMUNITY): Payer: BLUE CROSS/BLUE SHIELD

## 2018-06-20 ENCOUNTER — Encounter (HOSPITAL_COMMUNITY): Payer: Self-pay

## 2018-06-20 ENCOUNTER — Ambulatory Visit (HOSPITAL_COMMUNITY)
Admission: RE | Admit: 2018-06-20 | Discharge: 2018-06-20 | Disposition: A | Discharge status: Home / Self Care | Payer: BLUE CROSS/BLUE SHIELD | Source: Ambulatory Visit | Attending: Oncology | Admitting: Oncology

## 2018-06-20 ENCOUNTER — Other Ambulatory Visit: Payer: Self-pay | Admitting: Oncology

## 2018-06-20 ENCOUNTER — Ambulatory Visit (HOSPITAL_COMMUNITY): Payer: BLUE CROSS/BLUE SHIELD

## 2018-06-20 DIAGNOSIS — M67952 Unspecified disorder of synovium and tendon, left thigh: Secondary | ICD-10-CM | POA: Diagnosis not present

## 2018-06-20 DIAGNOSIS — M67951 Unspecified disorder of synovium and tendon, right thigh: Secondary | ICD-10-CM | POA: Insufficient documentation

## 2018-06-20 DIAGNOSIS — C61 Malignant neoplasm of prostate: Secondary | ICD-10-CM

## 2018-06-20 DIAGNOSIS — R972 Elevated prostate specific antigen [PSA]: Secondary | ICD-10-CM | POA: Diagnosis not present

## 2018-06-20 DIAGNOSIS — Z9079 Acquired absence of other genital organ(s): Secondary | ICD-10-CM | POA: Insufficient documentation

## 2018-06-20 DIAGNOSIS — M47816 Spondylosis without myelopathy or radiculopathy, lumbar region: Secondary | ICD-10-CM | POA: Diagnosis not present

## 2018-06-20 LAB — POCT I-STAT CREATININE: CREATININE: 0.8 mg/dL (ref 0.61–1.24)

## 2018-06-20 MED ORDER — GADOBENATE DIMEGLUMINE 529 MG/ML IV SOLN
20.0000 mL | Freq: Once | INTRAVENOUS | Status: AC | PRN
  Administered 2018-06-20: 17 mL via INTRAVENOUS

## 2018-06-24 ENCOUNTER — Other Ambulatory Visit: Payer: Self-pay | Admitting: Oncology

## 2018-06-24 DIAGNOSIS — C61 Malignant neoplasm of prostate: Secondary | ICD-10-CM

## 2018-06-26 ENCOUNTER — Encounter: Payer: Self-pay | Admitting: Oncology

## 2018-07-01 ENCOUNTER — Encounter: Payer: Self-pay | Admitting: Oncology

## 2018-07-02 ENCOUNTER — Other Ambulatory Visit: Payer: Self-pay | Admitting: Oncology

## 2018-07-02 DIAGNOSIS — C61 Malignant neoplasm of prostate: Secondary | ICD-10-CM

## 2018-07-03 ENCOUNTER — Telehealth: Payer: Self-pay | Admitting: Oncology

## 2018-07-03 NOTE — Telephone Encounter (Signed)
Spoke with patient re lab 10/8 @ 7:45 am. Added per 7/31 schedule message - per message lab only.

## 2018-07-15 ENCOUNTER — Encounter (HOSPITAL_COMMUNITY)
Admission: RE | Admit: 2018-07-15 | Discharge: 2018-07-15 | Disposition: A | Discharge status: Home / Self Care | Payer: BLUE CROSS/BLUE SHIELD | Source: Ambulatory Visit | Attending: Oncology | Admitting: Oncology

## 2018-07-15 DIAGNOSIS — C61 Malignant neoplasm of prostate: Secondary | ICD-10-CM | POA: Diagnosis not present

## 2018-07-15 MED ORDER — AXUMIN (FLUCICLOVINE F 18) INJECTION
10.5000 | Freq: Once | INTRAVENOUS | Status: AC
  Administered 2018-07-15: 10.5 via INTRAVENOUS

## 2018-07-18 ENCOUNTER — Encounter: Payer: Self-pay | Admitting: Oncology

## 2018-07-23 ENCOUNTER — Encounter: Payer: Self-pay | Admitting: Oncology

## 2018-08-21 DIAGNOSIS — J449 Chronic obstructive pulmonary disease, unspecified: Secondary | ICD-10-CM | POA: Diagnosis not present

## 2018-08-21 DIAGNOSIS — G4733 Obstructive sleep apnea (adult) (pediatric): Secondary | ICD-10-CM | POA: Diagnosis not present

## 2018-08-26 DIAGNOSIS — R143 Flatulence: Secondary | ICD-10-CM | POA: Diagnosis not present

## 2018-08-27 DIAGNOSIS — R143 Flatulence: Secondary | ICD-10-CM | POA: Diagnosis not present

## 2018-08-27 DIAGNOSIS — R109 Unspecified abdominal pain: Secondary | ICD-10-CM | POA: Diagnosis not present

## 2018-08-27 DIAGNOSIS — R140 Abdominal distension (gaseous): Secondary | ICD-10-CM | POA: Diagnosis not present

## 2018-09-04 ENCOUNTER — Ambulatory Visit (INDEPENDENT_AMBULATORY_CARE_PROVIDER_SITE_OTHER): Payer: BLUE CROSS/BLUE SHIELD | Admitting: Physician Assistant

## 2018-09-04 ENCOUNTER — Encounter: Payer: Self-pay | Admitting: Physician Assistant

## 2018-09-04 VITALS — BP 122/78 | HR 68 | Ht 71.0 in | Wt 183.1 lb

## 2018-09-04 DIAGNOSIS — R1013 Epigastric pain: Secondary | ICD-10-CM

## 2018-09-04 DIAGNOSIS — R140 Abdominal distension (gaseous): Secondary | ICD-10-CM

## 2018-09-04 DIAGNOSIS — K219 Gastro-esophageal reflux disease without esophagitis: Secondary | ICD-10-CM | POA: Diagnosis not present

## 2018-09-04 MED ORDER — FAMOTIDINE 40 MG PO TABS: ORAL_TABLET | ORAL | 3 refills | Status: DC

## 2018-09-04 NOTE — Patient Instructions (Signed)
Continue Omeprazole 40 mg- take 1 tablet by mouth with breakfast. We sent a prescription to your pharmacy for Pepcid 40 mg to take with dinner.  We have given you samples of FD GARD for the bloating problems. Also a coupon has been provided.  You can get this at your pharmacy, [facility], [place], [employer].  Try to follow a low gas diet, handout provided.   You have been scheduled for an endoscopy. Please follow written instructions given to you at your visit today. If you use inhalers (even only as needed), please bring them with you on the day of your procedure.  Normal BMI (Body Mass Index- based on height and weight) is between 19 and 25. Your BMI today is Body mass index is 25.54 kg/m. [name] Please consider follow up  regarding your BMI with your Primary Care Provider.

## 2018-09-05 ENCOUNTER — Encounter: Payer: Self-pay | Admitting: Physician Assistant

## 2018-09-05 NOTE — Progress Notes (Signed)
Subjective:    Patient ID: Samuel Richmond, male    DOB: 04/11/1956, 62 y.o.   MRN: [id number]  HPI [name] is a pleasant 62 year old white male known to Dr. Ardis Hughs who comes in today with complaints of abdominal discomfort and bloating.  He was last seen in the office in 2017 at which time he had colonoscopy with finding of a few medium sized diverticuli throughout the whole colon and there was evidence of mild radiation proctitis, no polyps. He does have history of prostate cancer with prostatectomy in 2014 and recurrence and salvage radiation.  Also with chronic anxiety depression, hyperlipidemia, GERD and COPD. He had a recent PET scan in August 2019 which was negative with no evidence of metastatic disease in the abdomen.  He also reports a recent ultrasound done at [facility] which was negative. He comes in concerned about periodic episodes of upper abdominal bloating have been going on for 15 years and are associated with some sense of difficulty breathing.  He says he used to get 1 or 2 of these a year but over the past year or so now is been getting them every couple of months and 1 of these episodes may last as long as 30 days.  He says usually feels best in the morning when he gets up before he eats anything and then is worse as the day goes on.  He describes it as a pressure-like sensation, or a sense like he needs to burp or belch but he cannot.  He says he is never been able to belch over the past many years. He does have COPD and has recently been evaluated by pulmonary who feels that his COPD is mild, and not causing his symptoms. He tends towards constipation, usually takes Metamucil daily and will have a bowel movement every 2 to 3 days.  He is generally not drinking carbonated beverages, does not take in much lactose , he does use artificial sweetener with his coffee in the morning.  Review of Systems Pertinent positive and negative review of systems were noted in the above HPI  section.  All other review of systems was otherwise negative.  Outpatient Encounter Medications as of 09/04/2018  Medication Sig  . buPROPion (WELLBUTRIN XL) 150 MG 24 hr tablet TAKE 1 TABLET BY MOUTH EVERY DAY  . escitalopram (LEXAPRO) 20 MG tablet TAKE 1 TABLET BY MOUTH EVERY DAY  . Fluticasone-Umeclidin-Vilant (TRELEGY ELLIPTA) 100-62.5-25 MCG/INH AEPB Take 1 puff by mouth daily.  [name] omeprazole (PRILOSEC) 40 MG capsule Take 40 mg by mouth daily.  . psyllium (METAMUCIL) 58.6 % powder Take 1 packet by mouth 2 (two) times daily.  . simvastatin (ZOCOR) 20 MG tablet TAKE 1 TABLET BY MOUTH EVERYDAY AT BEDTIME  . famotidine (PEPCID) 40 MG tablet Take 1 tablet by mouth with dinner.   No facility-administered encounter medications on file as of 09/04/2018.    Allergies  Allergen Reactions  . Levaquin [Levofloxacin] Other (See Comments)    LEG CRAMPS, CHILLS   Patient Active Problem List   Diagnosis Date Noted  . Emphysema, unspecified ([place]) 09/26/2017  . Dyspnea on exertion 03/15/2016  . Coronary artery calcification seen on CAT scan 06/23/2015  . Physical exam 06/02/2015  . Multiple pulmonary nodules determined by computed tomography of lung 06/02/2015  . 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  . Prostate cancer ([place])   . Asthma   . BENIGN NEOPLASM SKIN LOWER LIMB INCLUDING HIP  10/09/2007  . Depression with anxiety 10/08/2007  . Reactive airway disease 10/08/2007  . GERD 10/08/2007   Social History   Socioeconomic History  . Marital status: Married    Spouse name: Not on file  . Number of children: 1  . Years of education: Not on file  . Highest education level: Not on file  Occupational History    Employer: [employer]  Social Needs  . Financial resource strain: Not on file  . Food insecurity:    Worry: Not on file    Inability: Not on file  . Transportation needs:    Medical: Not on file    Non-medical: Not on file  Tobacco Use    . Smoking status: Former Smoker    Packs/day: 1.00    Years: 20.00    Pack years: 20.00    Types: Cigarettes    Last attempt to quit: 12/03/1998    Years since quitting: 19.7  . Smokeless tobacco: Never Used  Substance and Sexual Activity  . Alcohol use: No    Alcohol/week: 0.0 standard drinks  . Drug use: No  . Sexual activity: Not on file  Lifestyle  . Physical activity:    Days per week: Not on file    Minutes per session: Not on file  . Stress: Not on file  Relationships  . Social connections:    Talks on phone: Not on file    Gets together: Not on file    Attends religious service: Not on file    Active member of club or organization: Not on file    Attends meetings of clubs or organizations: Not on file    Relationship status: Not on file  . Intimate partner violence:    Fear of current or ex partner: Not on file    Emotionally abused: Not on file    Physically abused: Not on file    Forced sexual activity: Not on file  Other Topics Concern  . 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 Kundrat family history includes Hyperlipidemia in his sister; Prostate cancer in his father; Rheumatic fever in his paternal grandmother.      Objective:    Vitals:   09/04/18 0911  BP: 122/78  Pulse: 68    Physical Exam; well-developed older white male in no acute distress, pleasant blood pressure 122/78 pulse 68,  HEENT; nontraumatic normocephalic EOMI PERRLA sclera anicteric buccal mucosa moist, Cardiovascular ;regular rate and rhythm with S1-S2 no murmur rub gallop, Pulmonary ;clear bilaterally, Abdomen ;soft, no appreciable distention bowel sounds are present no tympany no palpable mass or hepatosplenomegaly no tenderness.  Rectal ;exam not done, Extremities ;no clubbing cyanosis or edema skin warm dry, Neuro psych; alert and oriented, grossly nonfocal mood and affect  appropriate       Assessment & Plan:   62 year old white male with long history of periodic episodes of upper abdominal bloating and fullness may last for multiple days.  He used to have episodes 1 or 2 once or twice a year and is now having them at least every couple of months.  This sensation associated with a feeling of air hunger.  He has had recent pulmonary evaluation and is told he has mild COPD and that that is not causing his symptoms.  Patient does have history of GERD and is on chronic omeprazole.  Generally no heartburn.  #2 diverticulosis 3.  Colon cancer surveillance up-to-date with last colonoscopy done in 2017 no polyps 4.  History of mild radiation proctitis 5.  History of prostate cancer 6.  Anxiety depression 7.  COPD  Plan; continue omeprazole 40 mg every morning and add Pepcid 40 mg AC dinner. He has not found Gas-X or Mylanta helpful we will give him a trial of FD gard take 30 minutes before meals Discussed a low gas diet and he was given a copy. Also advised to avoid artificial sweeteners. We will schedule for upper endoscopy with Dr. Ardis Hughs.  Procedure was discussed in detail with patient including indications risks and benefits and he is agreeable to proceed.  Sreeja Spies S Mikail Goostree PA-C 09/05/2018   Cc: Midge Minium, MD

## 2018-09-07 NOTE — Progress Notes (Signed)
I agree with the above note, plan 

## 2018-09-08 ENCOUNTER — Other Ambulatory Visit: Payer: Self-pay | Admitting: Family Medicine

## 2018-09-08 DIAGNOSIS — E782 Mixed hyperlipidemia: Secondary | ICD-10-CM

## 2018-09-09 ENCOUNTER — Inpatient Hospital Stay: Payer: BLUE CROSS/BLUE SHIELD | Attending: Oncology

## 2018-09-09 DIAGNOSIS — C61 Malignant neoplasm of prostate: Secondary | ICD-10-CM | POA: Insufficient documentation

## 2018-09-10 LAB — PROSTATE-SPECIFIC AG, SERUM (LABCORP): Prostate Specific Ag, Serum: 1.8 ng/mL (ref 0.0–4.0)

## 2018-09-11 ENCOUNTER — Telehealth: Payer: Self-pay | Admitting: *Deleted

## 2018-09-11 NOTE — Telephone Encounter (Signed)
Patient called to obtain last PSA level.

## 2018-09-15 ENCOUNTER — Encounter: Payer: Self-pay | Admitting: Oncology

## 2018-09-18 ENCOUNTER — Telehealth: Payer: Self-pay | Admitting: Oncology

## 2018-09-18 ENCOUNTER — Other Ambulatory Visit: Payer: Self-pay | Admitting: Oncology

## 2018-09-18 DIAGNOSIS — C61 Malignant neoplasm of prostate: Secondary | ICD-10-CM

## 2018-09-18 NOTE — Telephone Encounter (Signed)
Appt added letter/calendar mailed per 10/17 sch msg

## 2018-10-01 ENCOUNTER — Encounter: Payer: BLUE CROSS/BLUE SHIELD | Admitting: Gastroenterology

## 2018-10-01 DIAGNOSIS — C61 Malignant neoplasm of prostate: Secondary | ICD-10-CM | POA: Diagnosis not present

## 2018-10-05 DIAGNOSIS — G4733 Obstructive sleep apnea (adult) (pediatric): Secondary | ICD-10-CM | POA: Diagnosis not present

## 2018-10-15 DIAGNOSIS — J984 Other disorders of lung: Secondary | ICD-10-CM | POA: Diagnosis not present

## 2018-10-15 DIAGNOSIS — F419 Anxiety disorder, unspecified: Secondary | ICD-10-CM | POA: Diagnosis not present

## 2018-10-15 DIAGNOSIS — G4733 Obstructive sleep apnea (adult) (pediatric): Secondary | ICD-10-CM | POA: Diagnosis not present

## 2018-10-15 DIAGNOSIS — F329 Major depressive disorder, single episode, unspecified: Secondary | ICD-10-CM | POA: Diagnosis not present

## 2018-10-16 DIAGNOSIS — C61 Malignant neoplasm of prostate: Secondary | ICD-10-CM | POA: Diagnosis not present

## 2018-10-21 ENCOUNTER — Encounter: Payer: Self-pay | Admitting: Gastroenterology

## 2018-10-21 ENCOUNTER — Ambulatory Visit (AMBULATORY_SURGERY_CENTER): Payer: BLUE CROSS/BLUE SHIELD | Admitting: Gastroenterology

## 2018-10-21 VITALS — BP 124/70 | HR 70 | Temp 97.8°F | Resp 18 | Ht 71.0 in | Wt 183.0 lb

## 2018-10-21 DIAGNOSIS — K219 Gastro-esophageal reflux disease without esophagitis: Secondary | ICD-10-CM | POA: Diagnosis not present

## 2018-10-21 DIAGNOSIS — K2970 Gastritis, unspecified, without bleeding: Secondary | ICD-10-CM | POA: Diagnosis not present

## 2018-10-21 DIAGNOSIS — K319 Disease of stomach and duodenum, unspecified: Secondary | ICD-10-CM | POA: Diagnosis not present

## 2018-10-21 DIAGNOSIS — K3189 Other diseases of stomach and duodenum: Secondary | ICD-10-CM

## 2018-10-21 DIAGNOSIS — G4733 Obstructive sleep apnea (adult) (pediatric): Secondary | ICD-10-CM | POA: Diagnosis not present

## 2018-10-21 MED ORDER — SODIUM CHLORIDE 0.9 % IV SOLN: 500.0000 mL | Freq: Once | INTRAVENOUS | Status: DC

## 2018-10-21 NOTE — Patient Instructions (Signed)
  Thank you for allowing [place] to care for you today!  Histology results by mail, approximately 2 weeks.  Continue with current medications including daily metamucil and miralax.  Resume previous diet and medications today.  Return to normal activities tomorrow.   YOU HAD AN ENDOSCOPIC PROCEDURE TODAY AT [facility]:   Refer to the procedure report that was given to you for any specific questions about what was found during the examination.  If the procedure report does not answer your questions, please call your gastroenterologist to clarify.  If you requested that your care partner not be given the details of your procedure findings, then the procedure report has been included in a sealed envelope for you to review at your convenience later.  YOU SHOULD EXPECT: Some feelings of bloating in the abdomen. Passage of more gas than usual.  Walking can help get rid of the air that was put into your GI tract during the procedure and reduce the bloating. If you had a lower endoscopy (such as a colonoscopy or flexible sigmoidoscopy) you may notice spotting of blood in your stool or on the toilet paper. If you underwent a bowel prep for your procedure, you may not have a normal bowel movement for a few days.  Please Note:  You might notice some irritation and congestion in your nose or some drainage.  This is from the oxygen used during your procedure.  There is no need for concern and it should clear up in a day or so.  SYMPTOMS TO REPORT IMMEDIATELY:     Following upper endoscopy (EGD)  Vomiting of blood or coffee ground material  New chest pain or pain under the shoulder blades  Painful or persistently difficult swallowing  New shortness of breath  Fever of 100F or higher  Black, tarry-looking stools  For urgent or emergent issues, a gastroenterologist can be reached at any hour by calling [phone].   DIET:  We do recommend a small meal at first, but then you may  proceed to your regular diet.  Drink plenty of fluids but you should avoid alcoholic beverages for 24 hours.  ACTIVITY:  You should plan to take it easy for the rest of today and you should NOT DRIVE or use heavy machinery until tomorrow (because of the sedation medicines used during the test).    FOLLOW UP: Our staff will call the number listed on your records the next business day following your procedure to check on you and address any questions or concerns that you may have regarding the information given to you following your procedure. If we do not reach you, we will leave a message.  However, if you are feeling well and you are not experiencing any problems, there is no need to return our call.  We will assume that you have returned to your regular daily activities without incident.  If any biopsies were taken you will be contacted by phone or by letter within the next 1-3 weeks.  Please call us at [phone] if you have not heard about the biopsies in 3 weeks.    SIGNATURES/CONFIDENTIALITY: You and/or your care partner have signed paperwork which will be entered into your electronic medical record.  These signatures attest to the fact that that the information above on your After Visit Summary has been reviewed and is understood.  Full responsibility of the confidentiality of this discharge information lies with you and/or your care-partner.

## 2018-10-21 NOTE — Progress Notes (Signed)
Called to room to assist during endoscopic procedure.  Patient ID and intended procedure confirmed with present staff. Received instructions for my participation in the procedure from the performing physician.  

## 2018-10-21 NOTE — Op Note (Addendum)
[facility] Patient Name: Serigne Kubicek Procedure Date: 10/21/2018 9:30 AM MRN: [id number] Endoscopist: Milus Banister , MD Age: 62 Referring MD:  Date of Birth: 21-May-1956 Gender: Male Account #: [id number] Procedure:                Upper GI endoscopy Indications:              Dyspepsia, bloating; chronic Medicines:                Monitored Anesthesia Care Procedure:                Pre-Anesthesia Assessment:                           - Prior to the procedure, a History and Physical                            was performed, and patient medications and                            allergies were reviewed. The patient's tolerance of                            previous anesthesia was also reviewed. The risks                            and benefits of the procedure and the sedation                            options and risks were discussed with the patient.                            All questions were answered, and informed consent                            was obtained. Prior Anticoagulants: The patient has                            taken no previous anticoagulant or antiplatelet                            agents. ASA Grade Assessment: II - A patient with                            mild systemic disease. After reviewing the risks                            and benefits, the patient was deemed in                            satisfactory condition to undergo the procedure.                           After obtaining informed consent, the endoscope was  passed under direct vision. Throughout the                            procedure, the patient's blood pressure, pulse, and                            oxygen saturations were monitored continuously. The                            Endoscope was introduced through the mouth, and                            advanced to the second part of duodenum. The upper                            GI endoscopy was  accomplished without difficulty.                            The patient tolerated the procedure well. Scope In: Scope Out: Findings:                 Mild inflammation characterized by erythema was                            found in the gastric antrum. Biopsies were taken                            with a cold forceps for histology.                           The exam was otherwise without abnormality. Complications:            No immediate complications. Estimated blood loss:                            None. Estimated Blood Loss:     Estimated blood loss: none. Impression:               - Mild gastritis, biopsied to check for H. pylori.                           - The examination was otherwise normal. Recommendation:           - Patient has a contact number available for                            emergencies. The signs and symptoms of potential                            delayed complications were discussed with the                            patient. Return to normal activities tomorrow.                            Written discharge instructions were  provided to the                            patient.                           - Resume previous diet.                           - Continue present medications. Continue daily                            metamucil and miralax, this is helping your                            constipation and also your bloating.                           - Await pathology results. Milus Banister, MD 10/21/2018 9:43:53 AM This report has been signed electronically.

## 2018-10-21 NOTE — Progress Notes (Signed)
To PACU, VSS. Report to Rn.tb 

## 2018-10-22 ENCOUNTER — Telehealth: Payer: Self-pay

## 2018-10-22 NOTE — Telephone Encounter (Signed)
  Follow up Call-  Call back number 10/21/2018 06/15/2016  Post procedure Call Back phone  # [phone]  Permission to leave phone message Yes Yes  Some recent data might be hidden     Patient questions:  Do you have a fever, pain , or abdominal swelling? No. Pain Score  0 *  Have you tolerated food without any problems? Yes.    Have you been able to return to your normal activities? Yes.    Do you have any questions about your discharge instructions: Diet   No. Medications  No. Follow up visit  No.  Do you have questions or concerns about your Care? No.  Actions: * If pain score is 4 or above: No action needed, pain <4.

## 2018-10-26 ENCOUNTER — Encounter: Payer: Self-pay | Admitting: Gastroenterology

## 2018-11-20 DIAGNOSIS — G4733 Obstructive sleep apnea (adult) (pediatric): Secondary | ICD-10-CM | POA: Diagnosis not present

## 2018-11-20 DIAGNOSIS — J449 Chronic obstructive pulmonary disease, unspecified: Secondary | ICD-10-CM | POA: Diagnosis not present

## 2018-11-24 ENCOUNTER — Other Ambulatory Visit: Payer: Self-pay | Admitting: Family Medicine

## 2018-11-24 ENCOUNTER — Other Ambulatory Visit: Payer: Self-pay | Admitting: Physician Assistant

## 2018-11-24 MED ORDER — BUPROPION HCL ER (XL) 150 MG PO TB24: 150.0000 mg | ORAL_TABLET | Freq: Every day | ORAL | 0 refills | Status: DC

## 2018-11-24 NOTE — Telephone Encounter (Signed)
Copied from Samuel Richmond [phone]. Topic: General - Other >> Nov 24, 2018 11:13 AM Oneta Rack wrote: Relation to pt: self  Call back number: [phone]  Pharmacy: [facility] at  [address] [place] [address] [phone] (New / temporary pharmacy)    Reason for call:  [place] and [place] his medication, patient requesting a 3 month supply buPROPion (WELLBUTRIN XL) 150 MG 24 hr tablet and escitalopram (LEXAPRO) 20 MG tablet, patient states he hasn't taken medication in 3 days and would like a follow up call from the nurse today. Patient scheduled with PCP for 12/31/2018.

## 2018-11-24 NOTE — Telephone Encounter (Signed)
Patient calling to check status. Would like a call with an update. Advised that we were still waiting on approval and that could take 72 hours. Patient states that he has already gone 3 days without this medication.

## 2018-11-24 NOTE — Telephone Encounter (Addendum)
Courtesy refill until appointment 12/31/18.

## 2018-12-09 ENCOUNTER — Other Ambulatory Visit: Payer: Self-pay | Admitting: Family Medicine

## 2018-12-12 ENCOUNTER — Inpatient Hospital Stay: Payer: BLUE CROSS/BLUE SHIELD

## 2018-12-15 ENCOUNTER — Inpatient Hospital Stay: Payer: BLUE CROSS/BLUE SHIELD | Attending: Oncology

## 2018-12-15 DIAGNOSIS — C61 Malignant neoplasm of prostate: Secondary | ICD-10-CM | POA: Insufficient documentation

## 2018-12-16 ENCOUNTER — Telehealth: Payer: Self-pay | Admitting: *Deleted

## 2018-12-16 LAB — PROSTATE-SPECIFIC AG, SERUM (LABCORP): Prostate Specific Ag, Serum: 1.1 ng/mL (ref 0.0–4.0)

## 2018-12-16 NOTE — Telephone Encounter (Signed)
-----   Message from Wyatt Portela, MD sent at 12/16/2018  8:18 AM EST ----- Please let him know his PSA is down. No change for now.

## 2018-12-16 NOTE — Telephone Encounter (Signed)
Spoke with patient. Gave results of last PSA 

## 2018-12-21 DIAGNOSIS — G4733 Obstructive sleep apnea (adult) (pediatric): Secondary | ICD-10-CM | POA: Diagnosis not present

## 2018-12-31 ENCOUNTER — Ambulatory Visit (INDEPENDENT_AMBULATORY_CARE_PROVIDER_SITE_OTHER): Payer: BLUE CROSS/BLUE SHIELD | Admitting: Family Medicine

## 2018-12-31 ENCOUNTER — Encounter: Payer: Self-pay | Admitting: Family Medicine

## 2018-12-31 ENCOUNTER — Other Ambulatory Visit: Payer: Self-pay

## 2018-12-31 VITALS — BP 122/80 | HR 74 | Temp 98.2°F | Resp 16 | Ht 71.0 in | Wt 188.4 lb

## 2018-12-31 DIAGNOSIS — Z0000 Encounter for general adult medical examination without abnormal findings: Secondary | ICD-10-CM | POA: Diagnosis not present

## 2018-12-31 DIAGNOSIS — C61 Malignant neoplasm of prostate: Secondary | ICD-10-CM

## 2018-12-31 DIAGNOSIS — E663 Overweight: Secondary | ICD-10-CM | POA: Diagnosis not present

## 2018-12-31 DIAGNOSIS — Z23 Encounter for immunization: Secondary | ICD-10-CM

## 2018-12-31 DIAGNOSIS — E785 Hyperlipidemia, unspecified: Secondary | ICD-10-CM | POA: Diagnosis not present

## 2018-12-31 NOTE — Progress Notes (Signed)
   Subjective:    Patient ID: Samuel Richmond, male    DOB: 1956/04/22, 63 y.o.   MRN: [id number]  HPI CPE- UTD on colonoscopy, flu.  Due for Tdap, Shingrix.  Pt would like to wean off antidepressants.   Review of Systems Patient reports no vision/hearing changes, anorexia, fever ,adenopathy, persistant/recurrent hoarseness, swallowing issues, chest pain, palpitations, edema, persistant/recurrent cough, hemoptysis, dyspnea (rest,exertional, paroxysmal nocturnal), gastrointestinal  bleeding (melena, rectal bleeding), abdominal pain, excessive heart burn, GU symptoms (dysuria, hematuria, voiding/incontinence issues) syncope, focal weakness, memory loss, numbness & tingling, skin/hair/nail changes, depression, anxiety, abnormal bruising/bleeding, musculoskeletal symptoms/signs.     Objective:   Physical Exam General Appearance:    Alert, cooperative, no distress, appears stated age  Head:    Normocephalic, without obvious abnormality, atraumatic  Eyes:    PERRL, conjunctiva/corneas clear, EOM's intact, fundi    benign, both eyes       Ears:    Normal TM's and external ear canals, both ears  Nose:   Nares normal, septum midline, mucosa normal, no drainage   or sinus tenderness  Throat:   Lips, mucosa, and tongue normal; teeth and gums normal  Neck:   Supple, symmetrical, trachea midline, no adenopathy;       thyroid:  No enlargement/tenderness/nodules  Back:     Symmetric, no curvature, ROM normal, no CVA tenderness  Lungs:     Clear to auscultation bilaterally, respirations unlabored  Chest wall:    No tenderness or deformity  Heart:    Regular rate and rhythm, S1 and S2 normal, no murmur, rub   or gallop  Abdomen:     Soft, non-tender, bowel sounds active all four quadrants,    no masses, no organomegaly  Genitalia:    Deferred to urology  Rectal:    Extremities:   Extremities normal, atraumatic, no cyanosis or edema  Pulses:   2+ and symmetric all extremities  Skin:   Skin color,  texture, turgor normal, no rashes or lesions  Lymph nodes:   Cervical, supraclavicular, and axillary nodes normal  Neurologic:   CNII-XII intact. Normal strength, sensation and reflexes      throughout          Assessment & Plan:

## 2018-12-31 NOTE — Assessment & Plan Note (Signed)
Pt's PE WNL.  UTD on colonoscopy.  Tdap and 1st Shingrix given at pt request.  Check labs.  Anticipatory guidance provided.

## 2018-12-31 NOTE — Assessment & Plan Note (Signed)
Follows w/ urology.  Would like PSA redrawn b/c his last value dropped from 1.8 --> 1.1 and he feels this is an outlier.  Lab ordered.

## 2018-12-31 NOTE — Assessment & Plan Note (Signed)
Chronic problem, tolerating statin w/o difficulty.  Check labs.  Adjust meds prn  

## 2018-12-31 NOTE — Assessment & Plan Note (Signed)
Encouraged continued healthy diet and regular exercise.  Check labs to risk stratify.  Will follow.

## 2018-12-31 NOTE — Patient Instructions (Addendum)
Schedule a nurse visit for your 2nd Shingrix before your birthday Follow up in 1 year or as needed We'll notify you of your lab results and make any changes if needed Continue to work on healthy diet and regular exercise- you're doing great! Call with any questions or concerns Happy New Year!

## 2018-12-31 NOTE — Addendum Note (Signed)
Addended by: Davis Gourd on: 12/31/2018 04:18 PM   Modules accepted: Orders

## 2019-01-01 LAB — CBC WITH DIFFERENTIAL/PLATELET
BASOS ABS: 0 10*3/uL (ref 0.0–0.1)
Basophils Relative: 0.4 % (ref 0.0–3.0)
EOS ABS: 0.1 10*3/uL (ref 0.0–0.7)
EOS PCT: 1.3 % (ref 0.0–5.0)
HCT: 43.7 % (ref 39.0–52.0)
HEMOGLOBIN: 15.1 g/dL (ref 13.0–17.0)
LYMPHS ABS: 1 10*3/uL (ref 0.7–4.0)
Lymphocytes Relative: 20 % (ref 12.0–46.0)
MCHC: 34.5 g/dL (ref 30.0–36.0)
MCV: 88 fl (ref 78.0–100.0)
MONO ABS: 0.6 10*3/uL (ref 0.1–1.0)
Monocytes Relative: 11.3 % (ref 3.0–12.0)
NEUTROS PCT: 67 % (ref 43.0–77.0)
Neutro Abs: 3.3 10*3/uL (ref 1.4–7.7)
Platelets: 261 10*3/uL (ref 150.0–400.0)
RBC: 4.97 Mil/uL (ref 4.22–5.81)
RDW: 13.1 % (ref 11.5–15.5)
WBC: 5 10*3/uL (ref 4.0–10.5)

## 2019-01-01 LAB — BASIC METABOLIC PANEL
BUN: 13 mg/dL (ref 6–23)
CHLORIDE: 104 meq/L (ref 96–112)
CO2: 27 mEq/L (ref 19–32)
CREATININE: 0.85 mg/dL (ref 0.40–1.50)
Calcium: 9.9 mg/dL (ref 8.4–10.5)
GFR: 90.52 mL/min (ref >60.00)
GLUCOSE: 91 mg/dL (ref 70–99)
Potassium: 4 mEq/L (ref 3.5–5.1)
Sodium: 140 mEq/L (ref 135–145)

## 2019-01-01 LAB — PSA: PSA: 1.2 ng/mL (ref 0.10–4.00)

## 2019-01-01 LAB — HEPATIC FUNCTION PANEL
ALBUMIN: 4.7 g/dL (ref 3.5–5.2)
ALK PHOS: 74 U/L (ref 39–117)
ALT: 19 U/L (ref 0–53)
AST: 19 U/L (ref 0–37)
Bilirubin, Direct: 0.2 mg/dL (ref 0.0–0.3)
TOTAL PROTEIN: 6.8 g/dL (ref 6.0–8.3)
Total Bilirubin: 1.4 mg/dL — ABNORMAL HIGH (ref 0.2–1.2)

## 2019-01-01 LAB — TSH: TSH: 2.73 u[IU]/mL (ref 0.35–4.50)

## 2019-01-01 LAB — LIPID PANEL
CHOL/HDL RATIO: 4
CHOLESTEROL: 154 mg/dL (ref 0–200)
HDL: 39.3 mg/dL (ref >39.00)
NonHDL: 114.89
TRIGLYCERIDES: 226 mg/dL — AB (ref 0.0–149.0)
VLDL: 45.2 mg/dL — AB (ref 0.0–40.0)

## 2019-01-01 LAB — LDL CHOLESTEROL, DIRECT: LDL DIRECT: 88 mg/dL

## 2019-01-02 ENCOUNTER — Telehealth: Payer: Self-pay

## 2019-01-02 NOTE — Telephone Encounter (Signed)
Attempted to contact patient to discuss lab results. No answer and voicemail box was full.

## 2019-01-02 NOTE — Telephone Encounter (Signed)
-----   Message from Midge Minium, MD sent at 01/02/2019  8:05 AM EST ----- Labs look good!  PSA is 1.2 which is stable compared to what urology got No changes at this time.  Continue to work on [employer] and regular exercise

## 2019-01-15 DIAGNOSIS — G4733 Obstructive sleep apnea (adult) (pediatric): Secondary | ICD-10-CM | POA: Diagnosis not present

## 2019-01-15 DIAGNOSIS — F329 Major depressive disorder, single episode, unspecified: Secondary | ICD-10-CM | POA: Diagnosis not present

## 2019-01-15 DIAGNOSIS — J449 Chronic obstructive pulmonary disease, unspecified: Secondary | ICD-10-CM | POA: Diagnosis not present

## 2019-01-15 DIAGNOSIS — F419 Anxiety disorder, unspecified: Secondary | ICD-10-CM | POA: Diagnosis not present

## 2019-03-23 ENCOUNTER — Encounter: Payer: Self-pay | Admitting: Family Medicine

## 2019-04-01 ENCOUNTER — Telehealth: Payer: Self-pay

## 2019-04-01 ENCOUNTER — Encounter: Payer: Self-pay | Admitting: Family Medicine

## 2019-04-01 NOTE — Telephone Encounter (Signed)
Someone from our office has responded to patient via [place].

## 2019-04-01 NOTE — Telephone Encounter (Signed)
Copied from [place] [phone]. Topic: Appointment Scheduling - Scheduling Inquiry for Clinic >> Apr 01, 2019 10:15 AM Lennox Solders wrote: Reason for CRM: pt would like to go to [place] location for his 2nd shingrix vaccine if possible

## 2019-04-01 NOTE — Telephone Encounter (Signed)
Copied from [place] [phone]. Topic: Appointment Scheduling - Scheduling Inquiry for Clinic >> Apr 01, 2019 10:15 AM Lennox Solders wrote: Reason for CRM: pt would like to go to [place] location for his 2nd shingrix vaccine if possible

## 2019-05-14 ENCOUNTER — Other Ambulatory Visit: Payer: Self-pay | Admitting: Family Medicine

## 2019-05-14 DIAGNOSIS — C61 Malignant neoplasm of prostate: Secondary | ICD-10-CM

## 2019-05-15 ENCOUNTER — Ambulatory Visit (INDEPENDENT_AMBULATORY_CARE_PROVIDER_SITE_OTHER): Payer: BC Managed Care – PPO

## 2019-05-15 ENCOUNTER — Other Ambulatory Visit: Payer: Self-pay

## 2019-05-15 DIAGNOSIS — Z23 Encounter for immunization: Secondary | ICD-10-CM | POA: Diagnosis not present

## 2019-05-15 DIAGNOSIS — C61 Malignant neoplasm of prostate: Secondary | ICD-10-CM | POA: Diagnosis not present

## 2019-05-15 LAB — PSA: PSA: 1.28 ng/mL (ref 0.10–4.00)

## 2019-05-15 NOTE — Progress Notes (Signed)
Administered second SHINGRIX vaccine, IM left arm. Patient tolerated well.

## 2019-05-16 ENCOUNTER — Other Ambulatory Visit: Payer: Self-pay | Admitting: Physician Assistant

## 2019-05-19 DIAGNOSIS — L237 Allergic contact dermatitis due to plants, except food: Secondary | ICD-10-CM | POA: Diagnosis not present

## 2019-05-31 ENCOUNTER — Other Ambulatory Visit: Payer: Self-pay | Admitting: Family Medicine

## 2019-06-04 ENCOUNTER — Other Ambulatory Visit: Payer: Self-pay | Admitting: Family Medicine

## 2019-06-04 DIAGNOSIS — E782 Mixed hyperlipidemia: Secondary | ICD-10-CM

## 2019-06-22 DIAGNOSIS — R310 Gross hematuria: Secondary | ICD-10-CM | POA: Diagnosis not present

## 2019-08-18 ENCOUNTER — Other Ambulatory Visit: Payer: Self-pay | Admitting: Family Medicine

## 2019-08-24 DIAGNOSIS — M79641 Pain in right hand: Secondary | ICD-10-CM | POA: Diagnosis not present

## 2019-08-24 DIAGNOSIS — M79642 Pain in left hand: Secondary | ICD-10-CM | POA: Diagnosis not present

## 2019-08-24 DIAGNOSIS — G5603 Carpal tunnel syndrome, bilateral upper limbs: Secondary | ICD-10-CM | POA: Diagnosis not present

## 2019-09-30 DIAGNOSIS — M654 Radial styloid tenosynovitis [de Quervain]: Secondary | ICD-10-CM | POA: Diagnosis not present

## 2019-09-30 DIAGNOSIS — C61 Malignant neoplasm of prostate: Secondary | ICD-10-CM | POA: Diagnosis not present

## 2019-10-12 DIAGNOSIS — M654 Radial styloid tenosynovitis [de Quervain]: Secondary | ICD-10-CM | POA: Diagnosis not present

## 2019-10-12 DIAGNOSIS — M65352 Trigger finger, left little finger: Secondary | ICD-10-CM | POA: Diagnosis not present

## 2019-10-12 DIAGNOSIS — M720 Palmar fascial fibromatosis [Dupuytren]: Secondary | ICD-10-CM | POA: Diagnosis not present

## 2019-11-11 ENCOUNTER — Other Ambulatory Visit: Payer: Self-pay | Admitting: Family Medicine

## 2019-11-16 DIAGNOSIS — M654 Radial styloid tenosynovitis [de Quervain]: Secondary | ICD-10-CM | POA: Diagnosis not present

## 2019-11-16 DIAGNOSIS — M720 Palmar fascial fibromatosis [Dupuytren]: Secondary | ICD-10-CM | POA: Diagnosis not present

## 2019-12-23 ENCOUNTER — Other Ambulatory Visit: Payer: BC Managed Care – PPO

## 2019-12-29 ENCOUNTER — Ambulatory Visit: Payer: BC Managed Care – PPO

## 2020-01-07 ENCOUNTER — Ambulatory Visit: Payer: BC Managed Care – PPO | Attending: Internal Medicine

## 2020-01-07 DIAGNOSIS — Z23 Encounter for immunization: Secondary | ICD-10-CM | POA: Insufficient documentation

## 2020-01-07 NOTE — Progress Notes (Signed)
   Covid-19 Vaccination Clinic  Name:  Samuel Richmond    MRN: [id number] DOB: [birth year]  01/07/2020  [name] was observed post Covid-19 immunization for 15 minutes without incidence. He was provided with Vaccine Information Sheet and instruction to access the V-Safe system.   [name] was instructed to call 911 with any severe reactions post vaccine: [name] Difficulty breathing  . Swelling of your face and throat  . A fast heartbeat  . A bad rash all over your body  . Dizziness and weakness    Immunizations Administered    Name Date Dose VIS Date Route   Pfizer COVID-19 Vaccine 01/07/2020  3:56 PM 0.3 mL 11/13/2019 Intramuscular   Manufacturer: Delta   Lot: [id number]   [place]: [id number]

## 2020-01-15 ENCOUNTER — Ambulatory Visit: Payer: BC Managed Care – PPO

## 2020-01-29 ENCOUNTER — Ambulatory Visit: Payer: BC Managed Care – PPO | Attending: Internal Medicine

## 2020-01-29 DIAGNOSIS — Z23 Encounter for immunization: Secondary | ICD-10-CM

## 2020-01-29 NOTE — Progress Notes (Signed)
   Covid-19 Vaccination Clinic  Name:  AURI MEHRER    MRN: [id number] DOB: 1956-03-28  01/29/2020  Mr. Trame was observed post Covid-19 immunization for 15 minutes without incidence. He was provided with Vaccine Information Sheet and instruction to access the V-Safe system.   Mr. Shon was instructed to call 911 with any severe reactions post vaccine: [name] Difficulty breathing  . Swelling of your face and throat  . A fast heartbeat  . A bad rash all over your body  . Dizziness and weakness    Immunizations Administered    Name Date Dose VIS Date Route   Pfizer COVID-19 Vaccine 01/29/2020  4:42 PM 0.3 mL 11/13/2019 Intramuscular   Manufacturer: [place]   Lot: [id number]   [place]: [id number]

## 2020-02-01 ENCOUNTER — Ambulatory Visit: Payer: Self-pay

## 2020-02-10 ENCOUNTER — Ambulatory Visit (INDEPENDENT_AMBULATORY_CARE_PROVIDER_SITE_OTHER): Payer: BC Managed Care – PPO | Admitting: Family Medicine

## 2020-02-10 ENCOUNTER — Other Ambulatory Visit: Payer: Self-pay

## 2020-02-10 ENCOUNTER — Encounter: Payer: Self-pay | Admitting: Family Medicine

## 2020-02-10 VITALS — BP 121/80 | HR 81 | Temp 97.9°F | Resp 16 | Ht 71.0 in | Wt 187.5 lb

## 2020-02-10 DIAGNOSIS — E785 Hyperlipidemia, unspecified: Secondary | ICD-10-CM | POA: Diagnosis not present

## 2020-02-10 DIAGNOSIS — Z0000 Encounter for general adult medical examination without abnormal findings: Secondary | ICD-10-CM

## 2020-02-10 DIAGNOSIS — C61 Malignant neoplasm of prostate: Secondary | ICD-10-CM | POA: Diagnosis not present

## 2020-02-10 MED ORDER — FLUTICASONE PROPIONATE 50 MCG/ACT NA SUSP: 2.0000 | Freq: Every day | NASAL | 6 refills | Status: AC

## 2020-02-10 NOTE — Assessment & Plan Note (Signed)
Chronic problem, tolerating statin w/o difficulty.  Check labs.  Adjust meds prn  

## 2020-02-10 NOTE — Assessment & Plan Note (Signed)
Pt's PE WNL.  UTD on colonoscopy, immunizations.  Check labs.  Anticipatory guidance provided.  

## 2020-02-10 NOTE — Progress Notes (Signed)
   Subjective:    Patient ID: Samuel Richmond, male    DOB: 09/11/1956, 64 y.o.   MRN: [id number]  HPI CPE- UTD on colonoscopy, Tdap, flu, COVID vaccines (will defer Prevnar due to recent COVID vaccine).  No concerns today.     Review of Systems Patient reports no vision/hearing changes, anorexia, fever ,adenopathy, persistant/recurrent hoarseness, swallowing issues, chest pain, palpitations, edema, persistant/recurrent cough, hemoptysis, dyspnea (rest,exertional, paroxysmal nocturnal), gastrointestinal  bleeding (melena, rectal bleeding), abdominal pain, excessive heart burn, GU symptoms (dysuria, hematuria, voiding/incontinence issues) syncope, focal weakness, memory loss, numbness & tingling, skin/hair/nail changes, depression, anxiety, abnormal bruising/bleeding, musculoskeletal symptoms/signs.   This visit occurred during the SARS-CoV-2 public health emergency.  Safety protocols were in place, including screening questions prior to the visit, additional usage of staff PPE, and extensive cleaning of exam room while observing appropriate contact time as indicated for disinfecting solutions.       Objective:   Physical Exam General Appearance:    Alert, cooperative, no distress, appears stated age  Head:    Normocephalic, without obvious abnormality, atraumatic  Eyes:    PERRL, conjunctiva/corneas clear, EOM's intact, fundi    benign, both eyes       Ears:    Normal TM's and external ear canals, both ears  Nose:   Deferred due to COVID  Throat:   Neck:   Supple, symmetrical, trachea midline, no adenopathy;       thyroid:  No enlargement/tenderness/nodules  Back:     Symmetric, no curvature, ROM normal, no CVA tenderness  Lungs:     Clear to auscultation bilaterally, respirations unlabored  Chest wall:    No tenderness or deformity  Heart:    Regular rate and rhythm, S1 and S2 normal, no murmur, rub   or gallop  Abdomen:     Soft, non-tender, bowel sounds active all four quadrants,     no masses, no organomegaly  Genitalia:    Deferred  Rectal:    Extremities:   Extremities normal, atraumatic, no cyanosis or edema  Pulses:   2+ and symmetric all extremities  Skin:   Skin color, texture, turgor normal, no rashes or lesions  Lymph nodes:   Cervical, supraclavicular, and axillary nodes normal  Neurologic:   CNII-XII intact. Normal strength, sensation and reflexes      throughout          Assessment & Plan:

## 2020-02-10 NOTE — Assessment & Plan Note (Signed)
Due for repeat PSA

## 2020-02-10 NOTE — Patient Instructions (Signed)
Follow up in 1 year or as needed We'll notify you of your lab results and make any changes if needed Continue to work on healthy diet and regular exercise- you can do it! Call with any questions or concerns Stay Safe!  Stay Healthy! 

## 2020-02-11 ENCOUNTER — Encounter: Payer: Self-pay | Admitting: General Practice

## 2020-02-11 LAB — BASIC METABOLIC PANEL
BUN: 11 mg/dL (ref 6–23)
CO2: 31 mEq/L (ref 19–32)
Calcium: 9.5 mg/dL (ref 8.4–10.5)
Chloride: 105 mEq/L (ref 96–112)
Creatinine, Ser: 0.93 mg/dL (ref 0.40–1.50)
GFR: 81.31 mL/min (ref >60.00)
Glucose, Bld: 81 mg/dL (ref 70–99)
Potassium: 4.3 mEq/L (ref 3.5–5.1)
Sodium: 141 mEq/L (ref 135–145)

## 2020-02-11 LAB — CBC WITH DIFFERENTIAL/PLATELET
Basophils Absolute: 0 10*3/uL (ref 0.0–0.1)
Basophils Relative: 0.3 % (ref 0.0–3.0)
Eosinophils Absolute: 0.1 10*3/uL (ref 0.0–0.7)
Eosinophils Relative: 1.8 % (ref 0.0–5.0)
HCT: 43.2 % (ref 39.0–52.0)
Hemoglobin: 14.8 g/dL (ref 13.0–17.0)
Lymphocytes Relative: 20.9 % (ref 12.0–46.0)
Lymphs Abs: 1.1 10*3/uL (ref 0.7–4.0)
MCHC: 34.3 g/dL (ref 30.0–36.0)
MCV: 89.1 fl (ref 78.0–100.0)
Monocytes Absolute: 0.4 10*3/uL (ref 0.1–1.0)
Monocytes Relative: 7.9 % (ref 3.0–12.0)
Neutro Abs: 3.5 10*3/uL (ref 1.4–7.7)
Neutrophils Relative %: 69.1 % (ref 43.0–77.0)
Platelets: 264 10*3/uL (ref 150.0–400.0)
RBC: 4.85 Mil/uL (ref 4.22–5.81)
RDW: 13.1 % (ref 11.5–15.5)
WBC: 5 10*3/uL (ref 4.0–10.5)

## 2020-02-11 LAB — LIPID PANEL
Cholesterol: 150 mg/dL (ref 0–200)
HDL: 40.2 mg/dL (ref >39.00)
LDL Cholesterol: 74 mg/dL (ref 0–99)
NonHDL: 109.63
Total CHOL/HDL Ratio: 4
Triglycerides: 180 mg/dL — ABNORMAL HIGH (ref 0.0–149.0)
VLDL: 36 mg/dL (ref 0.0–40.0)

## 2020-02-11 LAB — HEPATIC FUNCTION PANEL
ALT: 20 U/L (ref 0–53)
AST: 20 U/L (ref 0–37)
Albumin: 4.4 g/dL (ref 3.5–5.2)
Alkaline Phosphatase: 80 U/L (ref 39–117)
Bilirubin, Direct: 0.2 mg/dL (ref 0.0–0.3)
Total Bilirubin: 1.3 mg/dL — ABNORMAL HIGH (ref 0.2–1.2)
Total Protein: 6.7 g/dL (ref 6.0–8.3)

## 2020-02-11 LAB — TSH: TSH: 2.55 u[IU]/mL (ref 0.35–4.50)

## 2020-02-11 LAB — PSA: PSA: 1.98 ng/mL (ref 0.10–4.00)

## 2020-02-25 ENCOUNTER — Other Ambulatory Visit: Payer: Self-pay | Admitting: Family Medicine

## 2020-02-29 ENCOUNTER — Encounter: Payer: Self-pay | Admitting: Family Medicine

## 2020-03-15 ENCOUNTER — Other Ambulatory Visit: Payer: Self-pay | Admitting: Family Medicine

## 2020-03-15 DIAGNOSIS — E782 Mixed hyperlipidemia: Secondary | ICD-10-CM

## 2020-05-23 ENCOUNTER — Telehealth: Payer: Self-pay | Admitting: Family Medicine

## 2020-05-23 NOTE — Telephone Encounter (Signed)
Pt returned call. Read pt message about correct coding. Pt states he is going to call [employer]

## 2020-05-23 NOTE — Telephone Encounter (Signed)
I have left patient a vm to call back.    Patient had called in reference to why he was receiving a bill for 36.36.  Coding has stated that his labs have gone towards deductible and that all coding is correct in correspondence with Dr. Virgil Benedict documentation.

## 2020-06-14 ENCOUNTER — Telehealth: Payer: Self-pay

## 2020-06-14 NOTE — Telephone Encounter (Signed)
-----   Message from Wyatt Portela, MD sent at 06/14/2020  1:16 PM EDT ----- Based on recent PSA, no scans or follow up is needed. I recommend repeating his PSA in the future with his primary care physician.  If his PSA changes, we will address at that time. Thanks ----- Message ----- From: Tami Lin, RN Sent: 06/14/2020   1:04 PM EDT To: Wyatt Portela, MD  Patient was last seen by you 03/2018 for Prostate Cancer. He did not have MD follow up only periodic PSA checks. Last one done here was 12/15/2018. Patient stated his PSA has been checked recently by his PCP. Patient wants to know if he needs any f/u scans or appointments scheduled with you.  Lanelle Bal

## 2020-06-14 NOTE — Telephone Encounter (Signed)
Called patient and made him aware of Dr. Hazeline Junker response to his question below. Patient verbalized understanding.

## 2020-06-29 ENCOUNTER — Telehealth: Payer: Self-pay | Admitting: Gastroenterology

## 2020-06-29 ENCOUNTER — Encounter: Payer: Self-pay | Admitting: Family Medicine

## 2020-06-29 NOTE — Telephone Encounter (Signed)
The pt has been advised that he would need to speak with his oncologist, PCP or schedule appt with Dr Ardis Hughs to discuss.

## 2020-07-04 ENCOUNTER — Encounter: Payer: Self-pay | Admitting: Family Medicine

## 2020-07-21 ENCOUNTER — Other Ambulatory Visit: Payer: Self-pay | Admitting: Family Medicine

## 2020-07-27 DIAGNOSIS — Z03818 Encounter for observation for suspected exposure to other biological agents ruled out: Secondary | ICD-10-CM | POA: Diagnosis not present

## 2020-07-27 DIAGNOSIS — Z20822 Contact with and (suspected) exposure to covid-19: Secondary | ICD-10-CM | POA: Diagnosis not present

## 2020-07-28 ENCOUNTER — Other Ambulatory Visit: Payer: Self-pay

## 2020-07-28 ENCOUNTER — Encounter: Payer: Self-pay | Admitting: Family Medicine

## 2020-07-28 ENCOUNTER — Telehealth (INDEPENDENT_AMBULATORY_CARE_PROVIDER_SITE_OTHER): Payer: PPO | Admitting: Family Medicine

## 2020-07-28 DIAGNOSIS — M2550 Pain in unspecified joint: Secondary | ICD-10-CM | POA: Diagnosis not present

## 2020-07-28 DIAGNOSIS — M791 Myalgia, unspecified site: Secondary | ICD-10-CM | POA: Diagnosis not present

## 2020-07-28 DIAGNOSIS — J189 Pneumonia, unspecified organism: Secondary | ICD-10-CM

## 2020-07-28 NOTE — Progress Notes (Signed)
I have discussed the procedure for the virtual visit with the patient who has given consent to proceed with assessment and treatment.   Pt unable to obtain vitals.   Danine Hor L Glora Hulgan, CMA     

## 2020-07-28 NOTE — Progress Notes (Signed)
Virtual Visit via Video   I connected with patient on 07/28/20 at 11:00 AM EDT by a video enabled telemedicine application and verified that I am speaking with the correct person using two identifiers.  Location patient: Home Location provider: [facility], Office Persons participating in the virtual visit: Patient, Provider, [place] (Jess B)  I discussed the limitations of evaluation and management by telemedicine and the availability of in person appointments. The patient expressed understanding and agreed to proceed.  Subjective:   HPI:   Cough/body aches- pt 'has been seeking out pulmonary expertise to explain my issues' over 'the last 20 years'.  He reports he has had similar sxs 1-2x/yr over the years.  A few days ago developed chills, cough.  He states that previously it has taken up to 30 days to resolve.  Pt feels during these episodes he has an 'inflammatory process'- muscle aches, SOB.  Has 'burning in my skin'.  'I always have some degree of this monkey on my back'.  Mild, dry cough.  No fever.  No known sick contacts.  Reports chest feels hyper-inflated  ROS:   See pertinent positives and negatives per HPI.  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   Prostate cancer ([place])    Asthma    BENIGN NEOPLASM SKIN LOWER LIMB INCLUDING HIP 10/09/2007   Depression with anxiety 10/08/2007   GERD 10/08/2007    Social History   Tobacco Use   Smoking status: Former Smoker    Packs/day: 1.00    Years: 20.00    Pack years: 20.00    Types: Cigarettes    Quit date: 12/03/1998    Years  since quitting: 21.6   Smokeless tobacco: Never Used  Substance Use Topics   Alcohol use: No    Alcohol/week: 0.0 standard drinks    Current Outpatient Medications:    buPROPion (WELLBUTRIN XL) 150 MG 24 hr tablet, TAKE 1 TABLET BY MOUTH EVERY DAY, Disp: 90 tablet, Rfl: 1   escitalopram (LEXAPRO) 20 MG tablet, TAKE 1 TABLET BY MOUTH EVERY DAY, Disp: 90 tablet, Rfl: 1   fluticasone (FLONASE) 50 MCG/ACT nasal spray, Place 2 sprays into both nostrils daily., Disp: 16 g, Rfl: 6   psyllium (METAMUCIL) 58.6 % powder, Take 1 packet by mouth 2 (two) times daily., Disp: , Rfl:    simvastatin (ZOCOR) 20 MG tablet, TAKE 1 TABLET BY MOUTH EVERYDAY AT BEDTIME, Disp: 90 tablet, Rfl: 2   omeprazole (PRILOSEC) 40 MG capsule, Take 40 mg by mouth daily. (Patient not taking: Reported on 07/28/2020), Disp: , Rfl:   Allergies  Allergen Reactions   Levaquin [Levofloxacin] Other (See Comments)    LEG CRAMPS, CHILLS    Objective:   There were no vitals taken for this visit. AAOx3, NAD NCAT, EOMI No obvious CN deficits Coloring WNL Pt is able to speak clearly, coherently without shortness of breath or increased work of breathing.  Thought process is linear.  Mood is appropriate.   Assessment and Plan:   Lung inflammation/polyarthralgia/myalgia- pt reports he has had recurrent episodes of these same sxs 'for years'.  He has seen GI, pulmonary w/o any clear  dx for his cough, chest tightness, increased WOB.  States that he has always focused on the pulmonary aspect but now realizes there is a muscle and joint component as well.  Is worried about a possible inflammatory process.  Will get labs to assess and refer to rheumatology.  Thankfully pt is well appearing today, no cough heard, and no fever so no intervention is urgently needed.  Pt expressed understanding and is in agreement w/ plan.   Annye Asa, MD 07/28/2020

## 2020-07-29 ENCOUNTER — Ambulatory Visit (INDEPENDENT_AMBULATORY_CARE_PROVIDER_SITE_OTHER): Payer: PPO

## 2020-07-29 DIAGNOSIS — M2550 Pain in unspecified joint: Secondary | ICD-10-CM | POA: Diagnosis not present

## 2020-07-29 DIAGNOSIS — M791 Myalgia, unspecified site: Secondary | ICD-10-CM

## 2020-07-29 DIAGNOSIS — C61 Malignant neoplasm of prostate: Secondary | ICD-10-CM | POA: Diagnosis not present

## 2020-07-29 DIAGNOSIS — J189 Pneumonia, unspecified organism: Secondary | ICD-10-CM

## 2020-07-29 LAB — CBC WITH DIFFERENTIAL/PLATELET
Basophils Absolute: 0 10*3/uL (ref 0.0–0.1)
Basophils Relative: 0.1 % (ref 0.0–3.0)
Eosinophils Absolute: 0.1 10*3/uL (ref 0.0–0.7)
Eosinophils Relative: 1.6 % (ref 0.0–5.0)
HCT: 42.2 % (ref 39.0–52.0)
Hemoglobin: 14.6 g/dL (ref 13.0–17.0)
Lymphocytes Relative: 15.3 % (ref 12.0–46.0)
Lymphs Abs: 1.1 10*3/uL (ref 0.7–4.0)
MCHC: 34.5 g/dL (ref 30.0–36.0)
MCV: 87.3 fl (ref 78.0–100.0)
Monocytes Absolute: 0.5 10*3/uL (ref 0.1–1.0)
Monocytes Relative: 6.9 % (ref 3.0–12.0)
Neutro Abs: 5.5 10*3/uL (ref 1.4–7.7)
Neutrophils Relative %: 76.1 % (ref 43.0–77.0)
Platelets: 255 10*3/uL (ref 150.0–400.0)
RBC: 4.84 Mil/uL (ref 4.22–5.81)
RDW: 13.2 % (ref 11.5–15.5)
WBC: 7.3 10*3/uL (ref 4.0–10.5)

## 2020-07-29 LAB — PSA: PSA: 2.5 ng/mL (ref 0.10–4.00)

## 2020-07-29 LAB — SEDIMENTATION RATE: Sed Rate: 15 mm/hr (ref 0–20)

## 2020-07-29 LAB — CK: Total CK: 63 U/L (ref 7–232)

## 2020-07-31 LAB — ANA: Anti Nuclear Antibody (ANA): NEGATIVE

## 2020-08-01 NOTE — Progress Notes (Signed)
Called pt and lmovm to return call.

## 2020-08-02 ENCOUNTER — Encounter: Payer: Self-pay | Admitting: General Practice

## 2020-08-24 ENCOUNTER — Telehealth: Payer: Self-pay | Admitting: *Deleted

## 2020-08-24 NOTE — Telephone Encounter (Signed)
Patient has PSA monitored by PCP. Last one 07/29/20 showed slight increase. Patient wants to know if Dr. Alen Blew has enough samples to determine the doubling rate. He also wants to know if it's time for he and Dr. Alen Blew to have a conversation about his progression. Advised patient that Dr.Shadad is presently out of office and that he will receive this message on his return. Patient in agreement.

## 2020-08-30 ENCOUNTER — Telehealth: Payer: Self-pay | Admitting: Oncology

## 2020-08-30 NOTE — Telephone Encounter (Signed)
Scheduled appt per 9/28 sch msg - pt aware of appt date and time

## 2020-08-30 NOTE — Telephone Encounter (Signed)
Called patient and let him know Dr. Hazeline Junker recommendation. Patient verbalized understanding. Scheduling message sent . Patient is aware to expect a call from the scheduling department.

## 2020-08-30 NOTE — Telephone Encounter (Signed)
Please let him know that I would recommend repeating his PSA in 3 months.  I am happy to do this in our lab if he prefers we can discuss after that further steps pending the results.

## 2020-09-01 ENCOUNTER — Other Ambulatory Visit: Payer: Self-pay

## 2020-09-01 ENCOUNTER — Encounter: Payer: Self-pay | Admitting: Internal Medicine

## 2020-09-01 ENCOUNTER — Ambulatory Visit (INDEPENDENT_AMBULATORY_CARE_PROVIDER_SITE_OTHER): Payer: PPO

## 2020-09-01 ENCOUNTER — Ambulatory Visit (INDEPENDENT_AMBULATORY_CARE_PROVIDER_SITE_OTHER): Payer: PPO | Admitting: Internal Medicine

## 2020-09-01 DIAGNOSIS — R0600 Dyspnea, unspecified: Secondary | ICD-10-CM | POA: Diagnosis not present

## 2020-09-01 DIAGNOSIS — R0609 Other forms of dyspnea: Secondary | ICD-10-CM

## 2020-09-01 DIAGNOSIS — J449 Chronic obstructive pulmonary disease, unspecified: Secondary | ICD-10-CM | POA: Diagnosis not present

## 2020-09-01 DIAGNOSIS — R0602 Shortness of breath: Secondary | ICD-10-CM | POA: Diagnosis not present

## 2020-09-01 NOTE — Progress Notes (Signed)
Subjective:   Patient ID: Samuel Richmond, male    DOB: [birth year],    MRN: [id number]    Brief patient profile:  63 yowm quit smoking 2000 with sob and cough and extensive w/u in this clinic and at [facility] "never got a dx" self referred back to this clinic 03/15/2016 and again 09/01/2020     History of Present Illness  03/15/2016 1st Samuel Richmond/ Samuel Richmond   Chief Complaint  Patient presents with  . Pulmonary Consult    Self referral. Last seen here in 2006.  He states I have had breathing problems "for years" "I think I have Emphysema and my lung are hyperinflated".   since quit smoking in 2000 always bothered by doe but limited due L knee / rides stationery bike x 15 min/ with episodes that last up to 2 months of refractory sob at rest and 80% better spont at time of ov / does not disturb sleep. Daytime cough pattern, not waking him up, cough dry  tends to be worse when breathing worse > no better on GERD Rx  Already tried on inhalers including foradil with no improvement and now on spiriva not sure it's doing anything to pattern of "months of sob then all better" for up to a year  rec Stop fish oil and spiriva for now  Try prilosec otc 20mg   Take 30-60 min before first meal of the day and Pepcid ac (famotidine) 20 mg one @  bedtime  Until / including day you return for a methacholine challenge test GERD (REFLUX)   Please see patient coordinator before you leave today  to schedule methacholine challenge test in two weeks no sooner > neg    04/23/2016  f/u ov/Samuel Richmond re: dyspnea/ cough >  no rx but ppi/h2hs Chief Complaint  Patient presents with  . Follow-up    Pt is here for PFT results. He would like to know how it compares to his previous PFT from [place]. Pt c/o occasional cough.    no regular ex but really Not limited by breathing from desired activities  rec You do not have significant copd or asthma at this point and it's unlikely therefore you ever will Continue the  reflux medications and avoid tums  In the event of flare of your symptoms >  prilosec 20 mg x 2 30 min before and supper and pepcid 20 mg at bedtime and pay close attention to the diet  GERD diet    09/30/2017  Extended f/u ov/Samuel Richmond re: re-eval doe  Chief Complaint  Patient presents with  . Pulmonary Consult    Referred by Samuel Richmond for eval of Emphysema.  Pt c/o increased SOB, cough and CP x 1 month.    this is a chronic complaint but much worse x one month following ? Samuel Richmond in early oct 2018  rx 09/18/17 with pred/ zpak    but did not activate action plan for gerd as rec  above has ex bike but not using  Sleeping is fine with no phelgm congestion but some  dry cough daytime s specific triggers Allergy testing w/in the period of symptoms = 20 years/ nl but can't recall where/when done  rec  To get the most out of exercise, you need to be continuously aware that you are short of breath, but never out of breath, for 30 minutes daily.   If the throat issue continues to be a problem, call for referral to Samuel Richmond at  Samuel Richmond In the event of flare of your symptoms >  prilosec 20 mg x 2 30 min before and supper and pepcid 20 mg at bedtime and pay close attention to the diet    09/01/2020  f/u ov/Samuel Richmond re: unexplained sob s./p neg w/u by Samuel Richmond at [facility] but dx as asthma/copd and was  maint on trelegy s apparent benefit and does not plan on returning to [place]  Patient presents with  . Follow-up    Pt states that he would like to discuss last several breathingh tests. He states there has been a change in his breathing "it's been ongoing".  He states he is "always" slightly SOB with exertion.   Dyspnea:  Pattern for 20 years on avg once yearly ? More in fall unexplained resting sob  and getting over it now freq assoc with cough / sev years has noticed more doe x steps  Was riding staionery bike but stopped  3 months prior to OV   and then 5 weeks prior to OV  More resting  sob/  cough that graduallyimproved s specific rx but did not contact this office during any of the "acute phases" of this illness   Cough: not now / on omeprzole 40 mg  Ac and pepcid 20 mg  Sleeping: on cpap fine  SABA use: none  02: none    No obvious day to day or daytime variability or assoc excess/ purulent sputum or mucus plugs or hemoptysis or cp or chest tightness, subjective wheeze or overt sinus or hb symptoms.   Sleeping on cpap without nocturnal  or early am exacerbation  of respiratory  c/o's or need for noct saba. Also denies any obvious fluctuation of symptoms with weather or environmental changes or other aggravating or alleviating factors except as outlined above   No unusual exposure hx or h/o childhood pna/ asthma or knowledge of premature birth.  Current Allergies, Complete Past Medical History, Past Surgical History, Family History, and Social History were reviewed in [employer] record.  ROS  The following are not active complaints unless bolded Hoarseness, sore throat, dysphagia, dental problems, itching, sneezing,  nasal congestion or discharge of excess mucus or purulent secretions, ear ache,   fever, chills, sweats, unintended wt loss or wt gain, classically pleuritic or exertional cp,  orthopnea pnd or arm/hand swelling  or leg swelling, presyncope, palpitations, abdominal pain, anorexia, nausea, vomiting, diarrhea  or change in bowel habits or change in bladder habits, change in stools or change in urine, dysuria, hematuria,  rash, arthralgias, visual complaints, headache, numbness, weakness or ataxia or problems with walking or coordination,  change in mood or  memory.        Current Meds  Medication Sig  . buPROPion (WELLBUTRIN XL) 150 MG 24 hr tablet TAKE 1 TABLET BY MOUTH EVERY DAY  . escitalopram (LEXAPRO) 20 MG tablet TAKE 1 TABLET BY MOUTH EVERY DAY  . fluticasone (FLONASE) 50 MCG/ACT nasal spray Place 2 sprays into both nostrils daily.  [name]  omeprazole (PRILOSEC) 40 MG capsule Take 40 mg by mouth daily.   . psyllium (METAMUCIL) 58.6 % powder Take 1 packet by mouth 2 (two) times daily.  . simvastatin (ZOCOR) 20 MG tablet TAKE 1 TABLET BY MOUTH EVERYDAY AT BEDTIME                 Objective:   Physical Exam  Pleasant but somewhat anxious wm nad with classic pseudowheeze resolves with plm  09/01/2020       189 09/30/2017     182  04/23/2016       190  03/15/16 191 lb 9.6 oz (86.909 kg)  09/30/15 187 lb (84.823 kg)  07/05/15 181 lb 3.2 oz (82.192 kg)     Vital signs reviewed  09/01/2020  - Note at rest 02 sats  97% on RA   HEENT : pt wearing mask not removed for exam due to covid -19 concerns.    NECK :  without JVD/Nodes/TM/ nl carotid upstrokes bilaterally   LUNGS: no acc muscle use,  Nl contour chest which is clear to A and P bilaterally without cough on insp or exp maneuvers   CV:  RRR  no s3 or murmur or increase in P2, and no edema   ABD:  soft and nontender with nl inspiratory excursion in the supine position. No bruits or organomegaly appreciated, bowel sounds nl  MS:  Nl gait/ ext warm without deformities, calf tenderness, cyanosis or clubbing No obvious joint restrictions   SKIN: warm and dry without lesions    NEURO:  alert, approp, nl sensorium with  no motor or cerebellar deficits apparent.        CXR PA and Lateral:   09/01/2020 :    I personally reviewed images and agree with radiology impression as follows:   COPD. No active cardiopulmonary disease.           Assessment & Plan:

## 2020-09-01 NOTE — Patient Instructions (Addendum)
To get the most out of exercise, you need to be continuously aware that you are short of breath, but never out of breath, for 30 minutes daily. As you improve, it will actually be easier for you to do the same amount of exercise  in  30 minutes so always push to the level where you are short of breath.  If this does not result in improvement in your breathing over 2 weeks call for CPST     GERD (REFLUX)  is an extremely common cause of respiratory symptoms just like yours , many times with no obvious heartburn at all.    It can be treated with medication, but also with lifestyle changes including elevation of the head of your bed (ideally with 6 -8inch blocks under the headboard of your bed),  Smoking cessation, avoidance of late meals, excessive alcohol, and avoid fatty foods, chocolate, peppermint, colas, red wine, and acidic juices such as orange juice.  NO MINT OR MENTHOL PRODUCTS SO NO COUGH DROPS  USE SUGARLESS CANDY INSTEAD (Jolley ranchers or Stover's or Life Savers) or even ice chips will also do - the key is to swallow to prevent all throat clearing. NO OIL BASED VITAMINS - use powdered substitutes.  Avoid fish oil when coughing.    Call if not improved after 6 weeks to schedule a CPST

## 2020-09-02 ENCOUNTER — Encounter: Payer: Self-pay | Admitting: Internal Medicine

## 2020-09-02 NOTE — Assessment & Plan Note (Addendum)
[place] note 2009 / pulmonary:  Dx asthma/gerd "well controlled" with anxiety component rec clonazepam > pt denied at ov PFT's  12/23/2009   FEV1 2.97 (76 % ) ratio 69   with DLCO  84 %    .03/15/2016  Denied any medication helping his symptoms and only change noted from meds  Was adverse =  irritation from ICS  - Try off spiriva 03/15/2016 > no worse   - Methacholine challenge test 03/29/16 > neg for asthma with FEV1 2.89 with ratio 69  - Spirometry 09/30/2017  FEV1 2.66 (71%)  Ratio 69 with mild curvature on no rx  - PFT's  919/19   FEV1 2.8 (76 % ) ratio 0.65  p 7 % improvement from saba p ? prior to study    He does have very mild copd which might explain doe at high levels of exertion but would not explain the spells that sound like asthma as his MCT is neg and he has not responded to Trelegy to date so no longer uses any inhalers at all.  Since these spells are assoc with cough and he has pseudowheeze on exam  I still strongly suspect component of Upper airway cough syndrome (previously labeled PNDS),  is so named because it's frequently impossible to sort out how much is  CR/sinusitis with freq throat clearing (which can be related to primary GERD)   vs  causing  secondary (" extra esophageal")  GERD from wide swings in gastric pressure that occur with throat clearing, often  promoting self use of mint and menthol lozenges that reduce the lower esophageal sphincter tone and exacerbate the problem further in a cyclical fashion.   These are the same pts (now being labeled as having "irritable larynx syndrome" by some cough centers) who not infrequently have a history of having failed to tolerate ace inhibitors,  dry powder inhalers or biphosphonates or report having atypical/extraesophageal reflux symptoms that don't respond to standard doses of PPI  and are easily confused as having aecopd or asthma flares by even experienced allergists/ pulmonologists (myself included).    For now not having a spell and  offered to see with next flare and in meantime rec continue acid suppression and diet and  regular ex and f/u with cpst if dx for doe remains in doubt.    Discussed in detail all the  indications, usual  risks and alternatives  relative to the benefits with patient who agrees to proceed with w/u as outlined.             Each maintenance medication was reviewed in detail including emphasizing most importantly the difference between maintenance and prns and under what circumstances the prns are to be triggered using an action plan format where appropriate.  Total time for H and P, chart review, counseling, teaching device and generating customized AVS unique to this office visit for re-establishing p almost 3 y absence / charting =  40 min

## 2020-09-08 DIAGNOSIS — R0600 Dyspnea, unspecified: Secondary | ICD-10-CM

## 2020-09-08 DIAGNOSIS — R0609 Other forms of dyspnea: Secondary | ICD-10-CM

## 2020-09-08 NOTE — Telephone Encounter (Signed)
MW please advise.  Thanks.  

## 2020-09-20 NOTE — Telephone Encounter (Signed)
If he's convinced he's getting worse then go ahead and schedule full pfts

## 2020-09-27 ENCOUNTER — Other Ambulatory Visit: Payer: Self-pay | Admitting: Family Medicine

## 2020-10-19 DIAGNOSIS — M19022 Primary osteoarthritis, left elbow: Secondary | ICD-10-CM | POA: Diagnosis not present

## 2020-10-19 DIAGNOSIS — R200 Anesthesia of skin: Secondary | ICD-10-CM | POA: Diagnosis not present

## 2020-10-19 DIAGNOSIS — M25522 Pain in left elbow: Secondary | ICD-10-CM | POA: Diagnosis not present

## 2020-11-08 ENCOUNTER — Ambulatory Visit (INDEPENDENT_AMBULATORY_CARE_PROVIDER_SITE_OTHER): Payer: PPO | Admitting: Internal Medicine

## 2020-11-08 ENCOUNTER — Other Ambulatory Visit: Payer: Self-pay

## 2020-11-08 DIAGNOSIS — R0600 Dyspnea, unspecified: Secondary | ICD-10-CM

## 2020-11-08 DIAGNOSIS — R0609 Other forms of dyspnea: Secondary | ICD-10-CM

## 2020-11-08 LAB — PULMONARY FUNCTION TEST
DL/VA % pred: 117 %
DL/VA: 4.83 ml/min/mmHg/L
DLCO cor % pred: 110 %
DLCO cor: 30.19 ml/min/mmHg
DLCO unc % pred: 110 %
DLCO unc: 30.19 ml/min/mmHg
FEF 25-75 Post: 2.49 L/sec
FEF 25-75 Pre: 2.29 L/sec
FEF2575-%Change-Post: 8 %
FEF2575-%Pred-Post: 89 %
FEF2575-%Pred-Pre: 82 %
FEV1-%Change-Post: 2 %
FEV1-%Pred-Post: 88 %
FEV1-%Pred-Pre: 87 %
FEV1-Post: 3.14 L
FEV1-Pre: 3.07 L
FEV1FVC-%Change-Post: 2 %
FEV1FVC-%Pred-Pre: 99 %
FEV6-%Change-Post: 0 %
FEV6-%Pred-Post: 91 %
FEV6-%Pred-Pre: 91 %
FEV6-Post: 4.1 L
FEV6-Pre: 4.13 L
FEV6FVC-%Change-Post: 0 %
FEV6FVC-%Pred-Post: 105 %
FEV6FVC-%Pred-Pre: 105 %
FVC-%Change-Post: 0 %
FVC-%Pred-Post: 87 %
FVC-%Pred-Pre: 87 %
FVC-Post: 4.13 L
FVC-Pre: 4.13 L
Post FEV1/FVC ratio: 76 %
Post FEV6/FVC ratio: 100 %
Pre FEV1/FVC ratio: 74 %
Pre FEV6/FVC Ratio: 100 %
RV % pred: 129 %
RV: 3.13 L
TLC % pred: 100 %
TLC: 7.22 L

## 2020-11-08 NOTE — Progress Notes (Signed)
Full PFT performed today. °

## 2020-11-09 NOTE — Progress Notes (Signed)
Spoke with pt and notified of results per Dr. Wert. Pt verbalized understanding and denied any questions. 

## 2020-11-11 ENCOUNTER — Other Ambulatory Visit: Payer: Self-pay | Admitting: Oncology

## 2020-11-11 DIAGNOSIS — C61 Malignant neoplasm of prostate: Secondary | ICD-10-CM

## 2020-11-14 ENCOUNTER — Other Ambulatory Visit: Payer: PPO

## 2020-11-15 ENCOUNTER — Other Ambulatory Visit: Payer: Self-pay

## 2020-11-15 ENCOUNTER — Inpatient Hospital Stay: Payer: PPO | Attending: Oncology

## 2020-11-15 ENCOUNTER — Encounter: Payer: Self-pay | Admitting: Oncology

## 2020-11-15 DIAGNOSIS — C61 Malignant neoplasm of prostate: Secondary | ICD-10-CM

## 2020-11-15 DIAGNOSIS — Z79899 Other long term (current) drug therapy: Secondary | ICD-10-CM | POA: Insufficient documentation

## 2020-11-15 LAB — CBC WITH DIFFERENTIAL (CANCER CENTER ONLY)
Abs Immature Granulocytes: 0.02 10*3/uL (ref 0.00–0.07)
Basophils Absolute: 0 10*3/uL (ref 0.0–0.1)
Basophils Relative: 0 %
Eosinophils Absolute: 0.1 10*3/uL (ref 0.0–0.5)
Eosinophils Relative: 2 %
HCT: 42.3 % (ref 39.0–52.0)
Hemoglobin: 14.3 g/dL (ref 13.0–17.0)
Immature Granulocytes: 0 %
Lymphocytes Relative: 14 %
Lymphs Abs: 0.7 10*3/uL (ref 0.7–4.0)
MCH: 30 pg (ref 26.0–34.0)
MCHC: 33.8 g/dL (ref 30.0–36.0)
MCV: 88.9 fL (ref 80.0–100.0)
Monocytes Absolute: 0.4 10*3/uL (ref 0.1–1.0)
Monocytes Relative: 8 %
Neutro Abs: 3.8 10*3/uL (ref 1.7–7.7)
Neutrophils Relative %: 76 %
Platelet Count: 240 10*3/uL (ref 150–400)
RBC: 4.76 MIL/uL (ref 4.22–5.81)
RDW: 12.5 % (ref 11.5–15.5)
WBC Count: 5 10*3/uL (ref 4.0–10.5)
nRBC: 0 % (ref 0.0–0.2)

## 2020-11-15 LAB — CMP (CANCER CENTER ONLY)
ALT: 24 U/L (ref 0–44)
AST: 20 U/L (ref 15–41)
Albumin: 4.1 g/dL (ref 3.5–5.0)
Alkaline Phosphatase: 73 U/L (ref 38–126)
Anion gap: 8 (ref 5–15)
BUN: 11 mg/dL (ref 8–23)
CO2: 27 mmol/L (ref 22–32)
Calcium: 9.4 mg/dL (ref 8.9–10.3)
Chloride: 107 mmol/L (ref 98–111)
Creatinine: 0.98 mg/dL (ref 0.61–1.24)
GFR, Estimated: >60 mL/min (ref >60)
Glucose, Bld: 101 mg/dL — ABNORMAL HIGH (ref 70–99)
Potassium: 4 mmol/L (ref 3.5–5.1)
Sodium: 142 mmol/L (ref 135–145)
Total Bilirubin: 1.3 mg/dL — ABNORMAL HIGH (ref 0.3–1.2)
Total Protein: 6.9 g/dL (ref 6.5–8.1)

## 2020-11-16 LAB — PROSTATE-SPECIFIC AG, SERUM (LABCORP): Prostate Specific Ag, Serum: 3.4 ng/mL (ref 0.0–4.0)

## 2020-11-17 ENCOUNTER — Other Ambulatory Visit: Payer: Self-pay

## 2020-11-17 ENCOUNTER — Inpatient Hospital Stay (HOSPITAL_BASED_OUTPATIENT_CLINIC_OR_DEPARTMENT_OTHER): Payer: PPO | Admitting: Oncology

## 2020-11-17 VITALS — BP 134/80 | HR 72 | Temp 98.2°F | Resp 19 | Ht 71.0 in | Wt 190.8 lb

## 2020-11-17 DIAGNOSIS — C61 Malignant neoplasm of prostate: Secondary | ICD-10-CM | POA: Diagnosis not present

## 2020-11-17 NOTE — Progress Notes (Signed)
Hematology and Oncology Follow Up Visit  BAYLEY HURN [id number] 1956/07/08 64 y.o. 11/17/2020 9:06 AM Midge Minium, MDTabori, Aundra Millet, MD   Principle Diagnosis: 64 year old man with prostate cancer diagnosed in 2014.  He was found to have a Gleason score 4+3 equal 7, after presenting with T3a N0 disease.   Prior Therapy:  He underwent a radical prostatectomy and bilateral lymphadenectomy on March 26, 2013 under the care of Dr. Alinda Money the final pathology showed prostate cancer Gleason score 4+3 = 7 involves both lobes with focal extraprostatic extension noted.  No evidence of seminal vesicle involvement and 0 out of 9 lymph nodes involved.  The final pathological staging was T3aN0.     He developed biochemical relapse in November 2017 with a PSA of 0.17.  He received radiation therapy under the care of Dr. Valere Dross between January and March 2016 with undetectable PSA after that.   He developed biochemical relapse in 2019 with a PSA of 0.76 in April 2019.  His PSA was up to 1.98 in 2021, in August 2021 was 2.5 and in November 15, 2020 was 3.4.   Current therapy: Active surveillance.  Interim History: Mr. Sallade returns today for a follow-up visit.  Since the last visit, he reports no major changes in his health.  He denies any recent hospitalization or illnesses.  Denies abdominal pain or discomfort.  His performance status and quality of life remains unchanged.  He denies any frequency or urgency.  He denies any hematuria.     Medications: I have reviewed the patient's current medications.  Current Outpatient Medications  Medication Sig Dispense Refill  . buPROPion (WELLBUTRIN XL) 150 MG 24 hr tablet TAKE 1 TABLET BY MOUTH EVERY DAY 90 tablet 1  . escitalopram (LEXAPRO) 20 MG tablet TAKE 1 TABLET BY MOUTH EVERY DAY 90 tablet 1  . fluticasone (FLONASE) 50 MCG/ACT nasal spray Place 2 sprays into both nostrils daily. 16 g 6  . omeprazole (PRILOSEC) 40 MG capsule Take 40 mg  by mouth daily.     . psyllium (METAMUCIL) 58.6 % powder Take 1 packet by mouth 2 (two) times daily.    . simvastatin (ZOCOR) 20 MG tablet TAKE 1 TABLET BY MOUTH EVERYDAY AT BEDTIME 90 tablet 2   No current facility-administered medications for this visit.     Allergies:  Allergies  Allergen Reactions  . Levaquin [Levofloxacin] Other (See Comments)    LEG CRAMPS, CHILLS      Physical Exam: Blood pressure 134/80, pulse 72, temperature 98.2 F (36.8 C), temperature source Tympanic, resp. rate 19, height 5\' 11"  (1.803 m), weight 190 lb 12.8 oz (86.5 kg), SpO2 100 %.   ECOG: 0   General appearance: Comfortable appearing without any discomfort Head: Normocephalic without any trauma Oropharynx: Mucous membranes are moist and pink without any thrush or ulcers. Eyes: Pupils are equal and round reactive to light. Lymph nodes: No cervical, supraclavicular, inguinal or axillary lymphadenopathy.   Heart:regular rate and rhythm.  S1 and S2 without leg edema. Lung: Clear without any rhonchi or wheezes.  No dullness to percussion. Abdomin: Soft, nontender, nondistended with good bowel sounds.  No hepatosplenomegaly. Musculoskeletal: No joint deformity or effusion.  Full range of motion noted. Neurological: No deficits noted on motor, sensory and deep tendon reflex exam. Skin: No petechial rash or dryness.  Appeared moist.     Lab Results: Lab Results  Component Value Date   WBC 5.0 11/15/2020   HGB 14.3 11/15/2020   HCT 42.3  11/15/2020   MCV 88.9 11/15/2020   PLT 240 11/15/2020     Chemistry      Component Value Date/Time   NA 142 11/15/2020 0732   NA 139 05/17/2015 0000   K 4.0 11/15/2020 0732   CL 107 11/15/2020 0732   CO2 27 11/15/2020 0732   BUN 11 11/15/2020 0732   BUN 16 05/17/2015 0000   CREATININE 0.98 11/15/2020 0732   GLU 89 05/17/2015 0000      Component Value Date/Time   CALCIUM 9.4 11/15/2020 0732   ALKPHOS 73 11/15/2020 0732   AST 20 11/15/2020 0732    ALT 24 11/15/2020 0732   BILITOT 1.3 (H) 11/15/2020 0732       Radiological Studies: SKELETON  No focal  activity to suggest skeletal metastasis.  IMPRESSION: 1. No evidence local recurrence in the prostate bed. 2. No evidence of metastatic lymphadenopathy in the pelvis. 3. No evidence distant metastatic disease or skeletal disease      Impression and Plan:   64 year old man with:  1.    Biochemically recurrent prostate cancer after initially diagnosed in 2014.  He presented today with a Gleason score 4+3 = 7 and a PSA of 5.33 and found to have T3a disease after radical prostatectomy and lymphadenectomy.    The natural course of his disease was reviewed at this time and discussed in detail.  Laboratory testing in the last 2 years were reviewed as as well as imaging studies.  His PSA continues to rise with a doubling time that is over 6 months but certainly measurable and likely progressing.  Treatment options at this time were discussed which include continued active surveillance if he reaches a PSA of 10 or a rapid doubling time versus starting androgen deprivation therapy alone.  Alternative option would be to update his staging with a PSMA scan and consideration for treatment if he developed oligometastatic disease and reserve systemic therapy to a later date.    2.  Follow-up: Will be in the near future for his imaging studies and in 3 months for repeat laboratory testing  30  minutes were dedicated to this encounter.  The time was spent on reviewing his disease status, reviewing imaging studies as well as laboratory data.   Zola Button, MD 12/16/20219:06 AM

## 2020-11-29 ENCOUNTER — Encounter: Payer: Self-pay | Admitting: Oncology

## 2020-12-06 ENCOUNTER — Encounter: Payer: Self-pay | Admitting: Family Medicine

## 2020-12-07 ENCOUNTER — Other Ambulatory Visit: Payer: Self-pay

## 2020-12-07 DIAGNOSIS — G4730 Sleep apnea, unspecified: Secondary | ICD-10-CM

## 2020-12-09 ENCOUNTER — Other Ambulatory Visit: Payer: Self-pay

## 2020-12-09 ENCOUNTER — Ambulatory Visit (HOSPITAL_COMMUNITY)
Admission: RE | Admit: 2020-12-09 | Discharge: 2020-12-09 | Disposition: A | Discharge status: Home / Self Care | Payer: PPO | Source: Ambulatory Visit | Attending: Oncology | Admitting: Oncology

## 2020-12-09 DIAGNOSIS — C61 Malignant neoplasm of prostate: Secondary | ICD-10-CM | POA: Insufficient documentation

## 2020-12-09 DIAGNOSIS — R599 Enlarged lymph nodes, unspecified: Secondary | ICD-10-CM | POA: Diagnosis not present

## 2020-12-15 ENCOUNTER — Encounter: Payer: Self-pay | Admitting: Radiation Oncology

## 2020-12-15 NOTE — Progress Notes (Signed)
Histology and Location of Primary Cancer: biochemical recurrent prostate cancer  Location(s) of Symptomatic tumor(s): 12/09/2020 PET revealed nodular thickening deep right pelvis  Past/Anticipated chemotherapy by medical oncology, if any: Prostatectomy 2014.   Recurrence followed by xrt in 2017. Surveillance. PSA doubled. PET.  03/2018  psa 0.76 2021  psa 1.98 07/2020  psa 2.5 11/2020 psa 3.4  Patient's main complaints related to symptomatic tumor(s) are: denies  Pain on a scale of 0-10 is: denies    If Spine Met(s), symptoms, if any, include:  Bowel/Bladder retention or incontinence (please describe): Reports bowel urgency.   Numbness or weakness in extremities (please describe): denies  Current Decadron regimen, if applicable: denies  Ambulatory status? Walker? Wheelchair?: ambulatory  SAFETY ISSUES:  Prior radiation? Yes, 2017 with Dr. Valere Dross                                                Pacemaker/ICD? denies  Possible current pregnancy? No, male patient  Is the patient on methotrexate? denies  Additional Complaints / other details:  65 year old male.

## 2020-12-16 ENCOUNTER — Encounter: Payer: Self-pay | Admitting: Radiation Oncology

## 2020-12-16 ENCOUNTER — Ambulatory Visit
Admission: RE | Admit: 2020-12-16 | Discharge: 2020-12-16 | Disposition: A | Discharge status: Home / Self Care | Payer: PPO | Source: Ambulatory Visit | Attending: Urology | Admitting: Urology

## 2020-12-16 ENCOUNTER — Ambulatory Visit
Admission: RE | Admit: 2020-12-16 | Discharge: 2020-12-16 | Disposition: A | Discharge status: Home / Self Care | Payer: PPO | Source: Ambulatory Visit | Attending: Radiation Oncology | Admitting: Radiation Oncology

## 2020-12-16 ENCOUNTER — Other Ambulatory Visit: Payer: Self-pay

## 2020-12-16 DIAGNOSIS — F419 Anxiety disorder, unspecified: Secondary | ICD-10-CM | POA: Diagnosis not present

## 2020-12-16 DIAGNOSIS — Z87891 Personal history of nicotine dependence: Secondary | ICD-10-CM | POA: Insufficient documentation

## 2020-12-16 DIAGNOSIS — J449 Chronic obstructive pulmonary disease, unspecified: Secondary | ICD-10-CM | POA: Insufficient documentation

## 2020-12-16 DIAGNOSIS — C775 Secondary and unspecified malignant neoplasm of intrapelvic lymph nodes: Secondary | ICD-10-CM

## 2020-12-16 DIAGNOSIS — C61 Malignant neoplasm of prostate: Secondary | ICD-10-CM | POA: Insufficient documentation

## 2020-12-16 DIAGNOSIS — Z7952 Long term (current) use of systemic steroids: Secondary | ICD-10-CM | POA: Insufficient documentation

## 2020-12-16 DIAGNOSIS — Z923 Personal history of irradiation: Secondary | ICD-10-CM | POA: Insufficient documentation

## 2020-12-16 DIAGNOSIS — F418 Other specified anxiety disorders: Secondary | ICD-10-CM | POA: Diagnosis not present

## 2020-12-16 DIAGNOSIS — G4730 Sleep apnea, unspecified: Secondary | ICD-10-CM | POA: Diagnosis not present

## 2020-12-16 DIAGNOSIS — Z791 Long term (current) use of non-steroidal anti-inflammatories (NSAID): Secondary | ICD-10-CM | POA: Insufficient documentation

## 2020-12-16 DIAGNOSIS — E785 Hyperlipidemia, unspecified: Secondary | ICD-10-CM | POA: Diagnosis not present

## 2020-12-16 NOTE — Progress Notes (Signed)
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    ICD-10-CM   1. Prostate cancer ([place])  C61   2. Malignant neoplasm of prostate metastatic to intrapelvic lymph node [facility])  C61    C77.5     HISTORY OF PRESENT ILLNESS: Samuel Richmond is a 65 y.o. male seen at the request of Dr. Alen Blew. He was initially diagnosed with Gleason 4+3 prostate cancer back in 01/2013 by Dr. Gaynelle Arabian. PSA at time of diagnosis was 5.33, and staging studies were negative for metastatic disease. He was seen in our office by Dr. Valere Dross later that month and ultimately decided to proceed with prostatectomy. RALP was performed on 03/26/13 under the care of Dr. Alinda Money and final surgical pathology confirmed [id number], Gleason 4+3 prostatic adenocarcinoma with focal extraprostatic extension, carcinoma involving less than 8% of prostate tissue with negative margins and no seminal vesicle involvement. There were 9 negative lymph nodes (5 left and 4 right, 0/9). His initial postoperative PSA was undetectable but quickly rose to 0.17 by 10/2014. He was referred back to Dr. Valere Dross in 11/2014 and subsequently received 7.5 weeks of salvage radiation therapy to the prostate fossa.  He did not receive ADT at that time.  His PSA became undetectable following treatment.  He later developed a biochemical relapse in 2019 and was referred to Dr. Alen Blew.  Axumin PET scan performed in 07/2018 was negative for active disease so the recommendation was to continue in observation. He has remained under close disease surveillance since that time with a continued gradual rise in the PSA, most recently reaching  3.4 in 11/2020. The patient underwent PSMA scan on 12/09/2020 showing two ill-defined foci of nodular thickening in the deep right hemi-pelvis with intense radiotracer activity but no evidence of metastatic adenopathy outside of pelvis or visceral or skeletal metastasis.  He has kindly been referred to [place] today to discuss the role of stereotactic body radiotherapy in the management of his disease.  PREVIOUS RADIATION THERAPY: Yes  12/15/14 - 02/01/15: Prostate fossa / 66 Gy in 33 fractions (Dr. Valere Dross)  PAST MEDICAL HISTORY:  Past Medical History:  Diagnosis Date  . Allergy   . Anxiety   . Arthritis   . Cancer [facility])    prostate  . COPD (chronic obstructive pulmonary disease) (HCC)    mild; denies SOB with ADLs; no current med.  . Dental crowns present   . Depression   . Dyslipidemia    no current med.  [name] GERD (gastroesophageal reflux disease)    no current med.  [name] History of prostate cancer   . OSA on CPAP   . Prostate cancer ([place])   . Pulmonary nodules    "stable", per CT chest 06/08/2015  . S/P radiation therapy 12/15/2014 - 02/01/2015        . Sleep apnea    CPAP  . Stenosing tenosynovitis of finger 09/2015   bilateral index finger      PAST SURGICAL HISTORY: Past Surgical History:  Procedure Laterality Date  . COLONOSCOPY  05-08-06   normal  . HYDROCELE EXCISION Right 03/30/2004  . INGUINAL HERNIA REPAIR Left   . INGUINAL HERNIA REPAIR Right 03/30/2004  .  KNEE ARTHROSCOPY Bilateral    multiple   . LYMPHADENECTOMY Bilateral 03/26/2013   Procedure: LYMPHADENECTOMY;  Surgeon: Dutch Gray, MD;  Location: [facility];  Service: Urology;  Laterality: Bilateral;  . NASAL CONCHA BULLOSA RESECTION Right 08/01/2000  . NASAL TURBINATE REDUCTION  08/01/2000  . ROBOT ASSISTED LAPAROSCOPIC RADICAL PROSTATECTOMY Bilateral 03/26/2013   Procedure: ROBOTIC ASSISTED LAPAROSCOPIC RADICAL PROSTATECTOMY LEVEL 2;  Surgeon: Dutch Gray, MD;  Location: [facility];  Service: Urology;  Laterality: Bilateral;  3 HRS   . SEPTOPLASTY  08/01/2000  . TOTAL KNEE ARTHROPLASTY Left 07/15/2013   Dr Percell Miller  . TOTAL KNEE ARTHROPLASTY Left 07/15/2013   Procedure: TOTAL KNEE ARTHROPLASTY;  Surgeon: Ninetta Lights, MD;  Location: [place];  Service: Orthopedics;  Laterality: Left;  . TRIGGER FINGER RELEASE  09/04/2012   Procedure: MINOR RELEASE TRIGGER FINGER/A-1 PULLEY;  Surgeon: Cammie Sickle., MD;  Location: [place];  Service: Orthopedics;  Laterality: Left;  left long and cyst excision left long  . TRIGGER FINGER RELEASE Bilateral 12/09/2014   Procedure: RELEASE A-1 PULLEY RIGHT MIDDLE FINGER,RELEASE A-1 PULLEY LEFT RING FINGER;  Surgeon: Daryll Brod, MD;  Location: [place];  Service: Orthopedics;  Laterality: Bilateral;  . TRIGGER FINGER RELEASE Bilateral 09/30/2015   Procedure: RELEASE TRIGGER FINGER/A-1 PULLEY BILATERAL INDEX FINGERS;  Surgeon: Daryll Brod, MD;  Location: [place];  Service: Orthopedics;  Laterality: Bilateral;    FAMILY HISTORY:  Family History  Problem Relation Age of Onset  . Prostate cancer Father   . Rheumatic fever Paternal Grandmother   . Hyperlipidemia Sister   . Colon cancer Neg Hx   . Colon polyps Neg Hx   . Esophageal cancer Neg Hx   . Rectal cancer Neg Hx   . Stomach cancer Neg Hx     SOCIAL HISTORY:  Social History   Socioeconomic History  . Marital status: Married    Spouse name: Not on file  . Number of children: 1  . Years of education: Not on file  . Highest education level: Not on file  Occupational History    Employer: [employer]  Tobacco Use  . Smoking status: Former Smoker    Packs/day: 1.00    Years: 20.00    Pack years: 20.00    Types: Cigarettes    Quit date: 12/03/1998    Years since quitting: 22.0  . Smokeless tobacco: Never Used  Vaping Use  . Vaping Use: Never used  Substance and Sexual Activity  . Alcohol use: No     Alcohol/week: 0.0 standard drinks  . Drug use: No  . Sexual activity: Not Currently  Other Topics Concern  . 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 buPROPion (WELLBUTRIN XL) 150 MG 24 hr tablet TAKE 1 TABLET BY MOUTH EVERY DAY 90 tablet 1  . escitalopram (LEXAPRO) 20 MG tablet TAKE 1 TABLET BY MOUTH EVERY DAY 90 tablet 1  . fluticasone (FLONASE) 50 MCG/ACT nasal  spray Place 2 sprays into both nostrils daily. 16 g 6  . meloxicam (MOBIC) 7.5 MG tablet Take 7.5 mg by mouth daily.    . methylPREDNISolone (MEDROL DOSEPAK) 4 MG TBPK tablet Take by mouth.    [name] omeprazole (PRILOSEC) 40 MG capsule Take 40 mg by mouth daily.     . psyllium (METAMUCIL) 58.6 % powder Take 1 packet by mouth 2 (two) times daily.    . simvastatin (ZOCOR) 20 MG tablet TAKE 1 TABLET BY MOUTH EVERYDAY AT BEDTIME 90 tablet 2   No current facility-administered medications for this encounter.    REVIEW OF SYSTEMS:  On review of systems, the patient reports that he is doing well overall. He denies any chest pain, shortness of breath, cough, fevers, chills, night sweats, unintended weight changes. He denies any bladder disturbances, and denies abdominal pain, nausea or vomiting. He does report bowel urgency. He denies any new musculoskeletal or joint aches or pains. A complete review of systems is obtained and is otherwise negative.    PHYSICAL EXAM:  Wt Readings from Last 3 Encounters:  12/16/20 194 lb (88 kg)  11/17/20 190 lb 12.8 oz (86.5  kg)  09/01/20 189 lb 3.2 oz (85.8 kg)   Temp Readings from Last 3 Encounters:  12/16/20 98.1 F (36.7 C)  11/17/20 98.2 F (36.8 C) (Tympanic)  09/01/20 97.8 F (36.6 C) (Temporal)   BP Readings from Last 3 Encounters:  12/16/20 123/78  11/17/20 134/80  09/01/20 120/70   Pulse Readings from Last 3 Encounters:  12/16/20 70  11/17/20 72  09/01/20 89   Pain Assessment Pain Score: 0-No pain/10  In general this is a well appearing Caucasian male in no acute distress. He's alert and oriented x4 and appropriate throughout the examination. Cardiopulmonary assessment is negative for acute distress and he exhibits normal effort.     KPS = 100  100 - Normal; no complaints; no evidence of disease. 90   - Able to carry on normal activity; minor signs or symptoms of disease. 80   - Normal activity with effort; some signs or symptoms of disease. 63   - Cares for self; unable to carry on normal activity or to do active work. 60   - Requires occasional assistance, but is able to care for most of his personal needs. 50   - Requires considerable assistance and frequent medical care. 62   - Disabled; requires special care and assistance. 17   - Severely disabled; hospital admission is indicated although death not imminent. 48   - Very sick; hospital admission necessary; active supportive treatment necessary. 10   - Moribund; fatal processes progressing rapidly. 0     - Dead  Karnofsky DA, Abelmann [place], Craver LS and Burchenal [facility] [phone]) The use of the nitrogen mustards in the palliative treatment of carcinoma: with particular reference to bronchogenic carcinoma Cancer 1 [phone]  LABORATORY DATA:  Lab Results  Component Value Date   WBC 5.0 11/15/2020   HGB 14.3 11/15/2020   HCT 42.3 11/15/2020   MCV 88.9 11/15/2020   PLT 240 11/15/2020   Lab Results  Component Value Date   NA 142 11/15/2020   K 4.0 11/15/2020   CL 107 11/15/2020   CO2 27 11/15/2020   Lab Results  Component Value  Date   ALT 24 11/15/2020   AST 20 11/15/2020   ALKPHOS 73 11/15/2020   BILITOT 1.3 (H) 11/15/2020     RADIOGRAPHY: NM PET (F18-PYLARIFY) SKULL TO MID THIGH  Result Date: 12/09/2020 CLINICAL DATA:  Prostate carcinoma with biochemical recurrence. Prostatectomy 2014. Rising PSA. Radiation therapy 2016. PSA equal 3.4 on 11/15/2020. EXAM: NUCLEAR MEDICINE PET SKULL BASE TO THIGH TECHNIQUE: 9.0 mCi F18 Piflufolastat (Pylarify) was injected intravenously. Full-ring PET imaging was performed from the skull base to thigh after the radiotracer. CT data was obtained and used for attenuation correction and anatomic localization. COMPARISON:  Chest CT 06/08/2015 FINDINGS: NECK No radiotracer activity in neck lymph nodes. Incidental CT finding: None CHEST No radiotracer accumulation within mediastinal or hilar lymph nodes. No suspicious pulmonary nodules on the CT scan. Incidental CT finding: None ABDOMEN/PELVIS Prostate: No focal activity in the prostatectomy bed. Lymph nodes: Significant radiotracer activity within the RIGHT perirectal fat localizes to a tiny nodule measuring 2 mm (image 185/4) with SUV max equal 6.9. Second focus of nodular thickening measuring 5 mm on image 183/4 adjacent to the small bowel within the peritoneal fat of the deep RIGHT pelvis with SUV max equal 25. No radiotracer accumulation within periaortic retroperitoneal lymph nodes. Liver: No evidence of liver metastasis Intense physiologic activity noted in the bowel small bowel. Incidental CT finding: None SKELETON No focal  activity to suggest skeletal metastasis. IMPRESSION: 1. Two ill-defined foci of nodular thickening in the deep RIGHT pelvis with intense radiotracer activity are most consistent with nodal prostate cancer metastasis. 2. No evidence of metastatic adenopathy outside of the pelvis. 3. No evidence of visceral metastasis or skeletal metastasis. Electronically Signed   By: Suzy Bouchard M.D.   On: 12/09/2020 16:07       IMPRESSION/PLAN: 1. 65 y.o. man with oligometastatic prostate cancer to intrapelvic lymphnodes in the right hemipelvis. Today, we talked to the patient and his wife, who was available by speaker phone, about the findings and workup thus far. We discussed the natural history of prostate cancer and general treatment, highlighting the role of radiotherapy in the management of oligometastatic disease. In his case, our recommendation is to proceed with a 10 fraction course of stereotactic body radiotherapy focused on the involved lymph nodes in the deep right hemipelvis. We reviewed the available radiation techniques, and focused on the details and logistics of delivery. We reviewed the anticipated acute and late sequelae associated with radiation in this setting. He is familiar with the process given his prior treatment. The patient and his wife were encouraged to ask questions that were answered to their satisfaction.  At the end of our conversation, the patient is interested in proceeding with the recommended 10 fraction course of stereotactic body radiotherapy focused on the involved lymph nodes in the deep right hemipelvis.  He has freely signed written consent to proceed today in the office and a copy of this document will be placed in his chart.  We have tentatively scheduled him for CT simulation/treatment planning on Monday, 12/19/2020, in anticipation of beginning his SBRT in the near future.  We will share our discussion with Dr. Alinda Money and Dr. Alen Blew and move forward with treatment planning accordingly.  We enjoyed meeting with him today and look forward to continuing to participate in his care.   Nicholos Johns, PA-C    Tyler Pita, MD  [facility] Direct Dial: [phone]  Fax: [phone] [email or web address]  Skype  [email or web address]   This document serves as a record of services personally performed by Tyler Pita, MD and Freeman Caldron, PA-C. It was created on  their behalf by Wilburn Mylar, a trained medical scribe. The creation of this record is  based on the scribe's personal observations and the provider's statements to them. This document has been checked and approved by the attending provider.

## 2020-12-19 ENCOUNTER — Other Ambulatory Visit: Payer: Self-pay

## 2020-12-19 ENCOUNTER — Ambulatory Visit
Admission: RE | Admit: 2020-12-19 | Discharge: 2020-12-19 | Disposition: A | Discharge status: Home / Self Care | Payer: PPO | Source: Ambulatory Visit | Attending: Radiation Oncology | Admitting: Radiation Oncology

## 2020-12-19 DIAGNOSIS — C775 Secondary and unspecified malignant neoplasm of intrapelvic lymph nodes: Secondary | ICD-10-CM | POA: Insufficient documentation

## 2020-12-19 DIAGNOSIS — Z510 Encounter for antineoplastic radiation therapy: Secondary | ICD-10-CM | POA: Diagnosis not present

## 2020-12-19 DIAGNOSIS — C61 Malignant neoplasm of prostate: Secondary | ICD-10-CM

## 2020-12-19 NOTE — Progress Notes (Signed)
  Radiation Oncology         [phone] ________________________________  Name: Samuel Richmond MRN: [id number]  Date: 12/19/2020  DOB: 1955/12/14  STEREOTACTIC BODY RADIOTHERAPY SIMULATION AND TREATMENT PLANNING NOTE    ICD-10-CM   1. 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  Identity was confirmed.  All relevant records and images related to the planned course of therapy were reviewed.  The patient freely provided informed written consent to proceed with treatment after reviewing the details related to the planned course of therapy. The consent form was witnessed and verified by the simulation staff.  Then, the patient was set-up in a stable reproducible  supine position for radiation therapy.  A BodyFix immobilization pillow was fabricated for reproducible positioning.  Surface markings were placed.  The CT images were loaded into the planning software.  The gross target volumes (GTV) and planning target volumes (PTV) were delinieated, and avoidance structures were contoured.  Treatment planning then occurred.  The radiation prescription was entered and confirmed.  A total of two complex treatment devices were fabricated in the form of the BodyFix immobilization pillow and a neck accuform cushion.  I have requested : 3D Simulation  I have requested a DVH of the following structures: targets and all normal structures near the target including bowel, bladder, femoral heads and targets as noted on the radiation plan to maintain doses in adherence with established limits  SPECIAL TREATMENT PROCEDURE:  The planned course of therapy using radiation constitutes a special treatment procedure. Special care is required in the management of this patient for the  following reasons. High dose per fraction requiring special monitoring for increased toxicities of treatment including daily imaging..  The special nature of the planned course of radiotherapy will require increased physician supervision and oversight to ensure patient's safety with optimal treatment outcomes.  PLAN:  The patient will receive 50 Gy in 10 fractions to the involved nodes, while 30 Gy to the right iliac nodal basin.  ________________________________  Sheral Apley Tammi Klippel, M.D.

## 2020-12-22 DIAGNOSIS — Z510 Encounter for antineoplastic radiation therapy: Secondary | ICD-10-CM | POA: Diagnosis not present

## 2020-12-22 DIAGNOSIS — C61 Malignant neoplasm of prostate: Secondary | ICD-10-CM | POA: Diagnosis not present

## 2020-12-22 DIAGNOSIS — C775 Secondary and unspecified malignant neoplasm of intrapelvic lymph nodes: Secondary | ICD-10-CM | POA: Diagnosis not present

## 2020-12-28 ENCOUNTER — Other Ambulatory Visit: Payer: Self-pay | Admitting: Family Medicine

## 2020-12-28 ENCOUNTER — Ambulatory Visit
Admission: RE | Admit: 2020-12-28 | Discharge: 2020-12-28 | Disposition: A | Discharge status: Home / Self Care | Payer: PPO | Source: Ambulatory Visit | Attending: Radiation Oncology | Admitting: Radiation Oncology

## 2020-12-28 ENCOUNTER — Other Ambulatory Visit: Payer: Self-pay

## 2020-12-28 DIAGNOSIS — E782 Mixed hyperlipidemia: Secondary | ICD-10-CM

## 2020-12-28 DIAGNOSIS — C61 Malignant neoplasm of prostate: Secondary | ICD-10-CM | POA: Diagnosis not present

## 2020-12-28 DIAGNOSIS — C775 Secondary and unspecified malignant neoplasm of intrapelvic lymph nodes: Secondary | ICD-10-CM | POA: Diagnosis not present

## 2020-12-28 DIAGNOSIS — Z510 Encounter for antineoplastic radiation therapy: Secondary | ICD-10-CM | POA: Diagnosis not present

## 2020-12-29 ENCOUNTER — Other Ambulatory Visit: Payer: Self-pay

## 2020-12-29 ENCOUNTER — Ambulatory Visit
Admission: RE | Admit: 2020-12-29 | Discharge: 2020-12-29 | Disposition: A | Discharge status: Home / Self Care | Payer: PPO | Source: Ambulatory Visit | Attending: Radiation Oncology | Admitting: Radiation Oncology

## 2020-12-29 DIAGNOSIS — Z510 Encounter for antineoplastic radiation therapy: Secondary | ICD-10-CM | POA: Diagnosis not present

## 2020-12-29 DIAGNOSIS — C775 Secondary and unspecified malignant neoplasm of intrapelvic lymph nodes: Secondary | ICD-10-CM | POA: Diagnosis not present

## 2020-12-29 DIAGNOSIS — C61 Malignant neoplasm of prostate: Secondary | ICD-10-CM | POA: Diagnosis not present

## 2020-12-30 ENCOUNTER — Other Ambulatory Visit: Payer: Self-pay

## 2020-12-30 ENCOUNTER — Ambulatory Visit
Admission: RE | Admit: 2020-12-30 | Discharge: 2020-12-30 | Disposition: A | Discharge status: Home / Self Care | Payer: PPO | Source: Ambulatory Visit | Attending: Radiation Oncology | Admitting: Radiation Oncology

## 2020-12-30 DIAGNOSIS — Z510 Encounter for antineoplastic radiation therapy: Secondary | ICD-10-CM | POA: Diagnosis not present

## 2020-12-30 DIAGNOSIS — C775 Secondary and unspecified malignant neoplasm of intrapelvic lymph nodes: Secondary | ICD-10-CM | POA: Diagnosis not present

## 2020-12-30 DIAGNOSIS — C61 Malignant neoplasm of prostate: Secondary | ICD-10-CM | POA: Diagnosis not present

## 2021-01-02 ENCOUNTER — Other Ambulatory Visit: Payer: Self-pay

## 2021-01-02 ENCOUNTER — Ambulatory Visit
Admission: RE | Admit: 2021-01-02 | Discharge: 2021-01-02 | Disposition: A | Discharge status: Home / Self Care | Payer: PPO | Source: Ambulatory Visit | Attending: Radiation Oncology | Admitting: Radiation Oncology

## 2021-01-02 DIAGNOSIS — Z510 Encounter for antineoplastic radiation therapy: Secondary | ICD-10-CM | POA: Diagnosis not present

## 2021-01-02 DIAGNOSIS — C61 Malignant neoplasm of prostate: Secondary | ICD-10-CM | POA: Diagnosis not present

## 2021-01-02 DIAGNOSIS — C775 Secondary and unspecified malignant neoplasm of intrapelvic lymph nodes: Secondary | ICD-10-CM | POA: Diagnosis not present

## 2021-01-03 ENCOUNTER — Other Ambulatory Visit: Payer: Self-pay

## 2021-01-03 ENCOUNTER — Ambulatory Visit
Admission: RE | Admit: 2021-01-03 | Discharge: 2021-01-03 | Disposition: A | Discharge status: Home / Self Care | Payer: PPO | Source: Ambulatory Visit | Attending: Radiation Oncology | Admitting: Radiation Oncology

## 2021-01-03 DIAGNOSIS — C61 Malignant neoplasm of prostate: Secondary | ICD-10-CM | POA: Diagnosis not present

## 2021-01-03 DIAGNOSIS — Z510 Encounter for antineoplastic radiation therapy: Secondary | ICD-10-CM | POA: Insufficient documentation

## 2021-01-03 DIAGNOSIS — C775 Secondary and unspecified malignant neoplasm of intrapelvic lymph nodes: Secondary | ICD-10-CM | POA: Insufficient documentation

## 2021-01-04 ENCOUNTER — Other Ambulatory Visit: Payer: Self-pay

## 2021-01-04 ENCOUNTER — Ambulatory Visit
Admission: RE | Admit: 2021-01-04 | Discharge: 2021-01-04 | Disposition: A | Discharge status: Home / Self Care | Payer: PPO | Source: Ambulatory Visit | Attending: Radiation Oncology | Admitting: Radiation Oncology

## 2021-01-04 DIAGNOSIS — C61 Malignant neoplasm of prostate: Secondary | ICD-10-CM | POA: Diagnosis not present

## 2021-01-04 DIAGNOSIS — Z510 Encounter for antineoplastic radiation therapy: Secondary | ICD-10-CM | POA: Diagnosis not present

## 2021-01-04 DIAGNOSIS — C775 Secondary and unspecified malignant neoplasm of intrapelvic lymph nodes: Secondary | ICD-10-CM | POA: Diagnosis not present

## 2021-01-05 ENCOUNTER — Ambulatory Visit: Payer: PPO | Admitting: Pulmonary Disease

## 2021-01-05 ENCOUNTER — Ambulatory Visit (INDEPENDENT_AMBULATORY_CARE_PROVIDER_SITE_OTHER): Payer: PPO | Admitting: Ophthalmology

## 2021-01-05 ENCOUNTER — Encounter: Payer: Self-pay | Admitting: Pulmonary Disease

## 2021-01-05 ENCOUNTER — Encounter (INDEPENDENT_AMBULATORY_CARE_PROVIDER_SITE_OTHER): Payer: Self-pay | Admitting: Ophthalmology

## 2021-01-05 ENCOUNTER — Other Ambulatory Visit: Payer: Self-pay

## 2021-01-05 ENCOUNTER — Ambulatory Visit
Admission: RE | Admit: 2021-01-05 | Discharge: 2021-01-05 | Disposition: A | Discharge status: Home / Self Care | Payer: PPO | Source: Ambulatory Visit | Attending: Radiation Oncology | Admitting: Radiation Oncology

## 2021-01-05 VITALS — BP 124/72 | HR 69 | Temp 98.0°F | Ht 71.0 in | Wt 190.8 lb

## 2021-01-05 DIAGNOSIS — H4710 Unspecified papilledema: Secondary | ICD-10-CM | POA: Diagnosis not present

## 2021-01-05 DIAGNOSIS — Z9989 Dependence on other enabling machines and devices: Secondary | ICD-10-CM | POA: Diagnosis not present

## 2021-01-05 DIAGNOSIS — G4733 Obstructive sleep apnea (adult) (pediatric): Secondary | ICD-10-CM

## 2021-01-05 DIAGNOSIS — H47011 Ischemic optic neuropathy, right eye: Secondary | ICD-10-CM | POA: Diagnosis not present

## 2021-01-05 DIAGNOSIS — C775 Secondary and unspecified malignant neoplasm of intrapelvic lymph nodes: Secondary | ICD-10-CM | POA: Diagnosis not present

## 2021-01-05 DIAGNOSIS — A281 Cat-scratch disease: Secondary | ICD-10-CM | POA: Insufficient documentation

## 2021-01-05 DIAGNOSIS — C61 Malignant neoplasm of prostate: Secondary | ICD-10-CM | POA: Diagnosis not present

## 2021-01-05 DIAGNOSIS — Z510 Encounter for antineoplastic radiation therapy: Secondary | ICD-10-CM | POA: Diagnosis not present

## 2021-01-05 MED ORDER — ALPRAZOLAM 0.5 MG PO TABS: 0.5000 mg | ORAL_TABLET | Freq: Two times a day (BID) | ORAL | 1 refills | Status: DC | PRN

## 2021-01-05 MED ORDER — ALBUTEROL SULFATE HFA 108 (90 BASE) MCG/ACT IN AERS: 2.0000 | INHALATION_SPRAY | RESPIRATORY_TRACT | 11 refills | Status: AC | PRN

## 2021-01-05 MED ORDER — FLUTICASONE-SALMETEROL 250-50 MCG/DOSE IN AEPB: 1.0000 | INHALATION_SPRAY | Freq: Two times a day (BID) | RESPIRATORY_TRACT | 3 refills | Status: DC

## 2021-01-05 NOTE — Patient Instructions (Addendum)
Will contact [facility] for CPAP  -Current CPAP settings AutoSet 6-16 -Patient's mask of choice -Patient requests ResMed S 11  We are switching from Trelegy to [id number]-[id number], 1 puff twice daily I did send in the prescription for Xanax 0.5 twice daily-only use as needed Prescription for albuterol rescue to be used up to 4 times a day as needed  I will see you back in the office in about 3 months  Graded exercises as tolerated  Call with significant concerns

## 2021-01-05 NOTE — Progress Notes (Signed)
Samuel Richmond    [id number]    07-24-1956  Primary Care Physician:Tabori, Aundra Millet, MD  Referring Physician: Midge Minium, MD [address] [place],  [place] [address]  Chief complaint:   Patient is being seen for obstructive sleep apnea, obstructive lung disease  HPI:  He was to follow-up at [facility] for obstructive sleep apnea, insurance changed  Followed up with Dr. Work recently for dyspnea on exertion  Recently had PFT which showed no obstruction, no restriction, with increased residual volume  Records from [facility] was reviewed showing severe obstructive sleep apnea Recently has been using auto CPAP 6-16 with good control of events  Currently uses a borrowed machine  He does get short of breath with exertion, currently uses Trelegy-has been using it only periodically  He is not short of breath all the time but he may have periods of a week to a little bit more where he feels more short of breath, feels like he cannot get a breath in  Smoked for 20 years in the past quit many years back, 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 buPROPion (WELLBUTRIN XL) 150 MG 24 hr tablet TAKE 1 TABLET BY MOUTH EVERY DAY  . escitalopram (LEXAPRO) 20 MG tablet TAKE 1 TABLET BY MOUTH EVERY DAY  . fluticasone (FLONASE) 50 MCG/ACT nasal spray Place 2 sprays into both nostrils daily.  [name] omeprazole (PRILOSEC) 40 MG capsule Take 40 mg by mouth daily.   . psyllium (METAMUCIL) 58.6 % powder Take 1 packet by mouth 2 (two) times daily.  . simvastatin (ZOCOR) 20 MG tablet TAKE 1 TABLET BY MOUTH EVERYDAY AT BEDTIME  . meloxicam (MOBIC) 7.5 MG tablet  Take 7.5 mg by mouth daily.  . methylPREDNISolone (MEDROL DOSEPAK) 4 MG TBPK tablet Take by mouth.   No facility-administered encounter medications on file as of 01/05/2021.    Allergies as of 01/05/2021 - Review Complete 01/05/2021  Allergen Reaction Noted  . Levaquin [levofloxacin] Other (See Comments) 03/10/2013    Past Medical History:  Diagnosis Date  . Allergy   . Anxiety   . Arthritis   . Cancer [facility])    prostate  . COPD (chronic obstructive pulmonary disease) (HCC)    mild; denies SOB with ADLs; no current med.  . Dental crowns present   . Depression   . Dyslipidemia    no current med.  [name] GERD (gastroesophageal reflux disease)    no current med.  [name] History of prostate cancer   . OSA on CPAP   . Prostate cancer ([place])   . Pulmonary nodules    "stable", per CT chest 06/08/2015  . S/P radiation therapy 12/15/2014 - 02/01/2015        . Sleep apnea    CPAP  . Stenosing tenosynovitis of finger 09/2015   bilateral index finger    Past Surgical History:  Procedure Laterality Date  . COLONOSCOPY  05-08-06   normal  . HYDROCELE EXCISION Right 03/30/2004  . INGUINAL HERNIA REPAIR Left   . INGUINAL HERNIA REPAIR Right 03/30/2004  . KNEE ARTHROSCOPY Bilateral  multiple   . LYMPHADENECTOMY Bilateral 03/26/2013   Procedure: LYMPHADENECTOMY;  Surgeon: Dutch Gray, MD;  [facility];  Service: Urology;  Laterality: Bilateral;  . NASAL CONCHA BULLOSA RESECTION Right 08/01/2000  . NASAL TURBINATE REDUCTION  08/01/2000  . ROBOT ASSISTED LAPAROSCOPIC RADICAL PROSTATECTOMY Bilateral 03/26/2013   Procedure: ROBOTIC ASSISTED LAPAROSCOPIC RADICAL PROSTATECTOMY LEVEL 2;  Surgeon: Dutch Gray, MD;  [facility];  Service: Urology;  Laterality: Bilateral;  3 HRS   . SEPTOPLASTY  08/01/2000  . TOTAL KNEE ARTHROPLASTY Left 07/15/2013   Dr Percell Miller  . TOTAL KNEE ARTHROPLASTY Left 07/15/2013   Procedure: TOTAL KNEE  ARTHROPLASTY;  Surgeon: Ninetta Lights, MD;  Location: [place];  Service: Orthopedics;  Laterality: Left;  . TRIGGER FINGER RELEASE  09/04/2012   Procedure: MINOR RELEASE TRIGGER FINGER/A-1 PULLEY;  Surgeon: Cammie Sickle., MD;  Location: [place];  Service: Orthopedics;  Laterality: Left;  left long and cyst excision left long  . TRIGGER FINGER RELEASE Bilateral 12/09/2014   Procedure: RELEASE A-1 PULLEY RIGHT MIDDLE FINGER,RELEASE A-1 PULLEY LEFT RING FINGER;  Surgeon: Daryll Brod, MD;  Location: [place];  Service: Orthopedics;  Laterality: Bilateral;  . TRIGGER FINGER RELEASE Bilateral 09/30/2015   Procedure: RELEASE TRIGGER FINGER/A-1 PULLEY BILATERAL INDEX FINGERS;  Surgeon: Daryll Brod, MD;  Location: [place];  Service: Orthopedics;  Laterality: Bilateral;    Family History  Problem Relation Age of Onset  . Prostate cancer Father   . Rheumatic fever Paternal Grandmother   . Hyperlipidemia Sister   . Colon cancer Neg Hx   . Colon polyps Neg Hx   . Esophageal cancer Neg Hx   . Rectal cancer Neg Hx   . Stomach cancer Neg Hx     Social History   Socioeconomic History  . Marital status: Married    Spouse name: Not on file  . Number of children: 1  . Years of education: Not on file  . Highest education level: Not on file  Occupational History    Employer: [employer]  Tobacco Use  . Smoking status: Former Smoker    Packs/day: 1.00    Years: 20.00    Pack years: 20.00    Types: Cigarettes    Quit date: 12/03/1998    Years since quitting: 22.1  . Smokeless tobacco: Never Used  Vaping Use  . Vaping Use: Never used  Substance and Sexual Activity  . Alcohol use: No    Alcohol/week: 0.0 standard drinks  . Drug use: No  . Sexual activity: Not Currently  Other Topics Concern  . 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  Respiratory: Positive for apnea and shortness of breath.   Psychiatric/Behavioral: Positive for sleep disturbance.    Vitals:   01/05/21 1138  BP: 124/72  Pulse: 69  Temp: 98 F (36.7 C)  SpO2: 99%     Physical Exam Constitutional:      Appearance: Normal appearance.  HENT:     Head: Normocephalic.  Cardiovascular:     Rate and Rhythm: Normal rate and regular rhythm.     Pulses: Normal pulses.     Heart sounds: No murmur heard. No friction rub.  Pulmonary:     Effort: Pulmonary effort is normal. No respiratory distress.     Breath sounds: No stridor. No wheezing or rhonchi.  Musculoskeletal:     Cervical back: No rigidity or tenderness.  Neurological:     Mental Status: He is alert.  Psychiatric:        Mood and Affect: Mood normal.    Data Reviewed: Previous sleep study was reviewed  PFT was reviewed with the patient  Assessment:  History of severe obstructive sleep apnea -We will contact [facility] CPAP supplies -He does require a new CPAP His last study was in 2019  Obstructive lung disease with evidence of air trapping on his PFT -Trelegy is very expensive -We will switch to Wixela 250, 1 puff twice daily  Anxiety -Xanax 0.5 twice daily as needed  Albuterol use for shortness of breath and wheezing as needed  Plan/Recommendations: Contact [facility] with prescription -Patient would like to have a ResMed S 11 if possible -Current CPAP setting is auto set of 6-16 with patient's mask of choice  Prescription for albuterol sent into pharmacy Prescription for Rosalie sent to the pharmacy Prescription for Xanax sent into pharmacy  I will  see him back in about 3 months  Encouraged to call with any significant concerns     Sherrilyn Rist MD [facility] 01/05/2021, 12:08 PM  CC: Midge Minium, MD

## 2021-01-05 NOTE — Assessment & Plan Note (Signed)
I have encouraged the patient the most important single thing he can do to prevent progression of vascular disease in his eyes is to continue on CPAP use given the diagnosis of sleep apnea.

## 2021-01-05 NOTE — Assessment & Plan Note (Signed)
Patient did have a recent cat scratch, unusual to be around cats.  This optic type of optic neuropathy can in fact be associated with this particular with cat scratch disease thus he will need check for Bartonella henselai antibody

## 2021-01-05 NOTE — Assessment & Plan Note (Signed)
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 Patient has a negative review of systems for temporal arteritis, that is good appetite remains, no weight loss, no problems chewing with pain jaw claudication, and no unusual headaches. He has had no transient visual obscurations of vision in either eye prior to this.  Nonetheless appropriate work-up for temporal arteritis will be undertaken with CBC, erythrocyte sedimentation rate and C-reactive protein. 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 This might in fact alter a C-reactive protein and/or ESR.

## 2021-01-05 NOTE — Progress Notes (Signed)
01/05/2021     CHIEF COMPLAINT Patient presents for Blurred Vision (Pt referred by Dr. Sharen Counter for Ischemic Optic Neuropathy OD/Pt states the inferonasal quadrant of OD is blurry/ hazy. Onset was yesterday. Pt denies ocular pain. Denies new floaters and FOL)   HISTORY OF PRESENT ILLNESS: Samuel Richmond is a 65 y.o. male who presents to the clinic today for:   HPI    Blurred Vision    In right eye.  Vision is blurred and hazy.  Severity is moderate.  This started 1 day ago.  Occurring constantly.  It is worse throughout the day.  Context:  distance vision, mid-range vision and near vision.  Since onset it is stable.  Treatments tried include no treatments.  Response to treatment was no improvement.  I, the attending physician,  performed the HPI with the patient and updated documentation appropriately. Additional comments: Pt referred by Dr. Sharen Counter for Ischemic Optic Neuropathy OD Pt states the inferonasal quadrant of OD is blurry/ hazy. Onset was yesterday. Pt denies ocular pain. Denies new floaters and FOL          Comments    Upon further questioning patient does in fact have some recent experience around cats.  He did in fact have a mild cat scratch within the past 2 weeks.  No other symptoms, no fevers no febrile illnesses no sore or tender spots in his arm or is axilla on the right where his arm was scratched does raise the prospect of cat scratch induced disease, Bartonella Hensel I       Last edited by Hurman Horn, MD on 01/05/2021  4:30 PM. (History)      Referring physician: Midge Minium, MD [phone] [place],  [place] [address]  HISTORICAL INFORMATION:   Selected notes from the [facility]: No current outpatient medications on file. (Ophthalmic Drugs)   No current facility-administered medications for this visit. (Ophthalmic Drugs)   Current Outpatient Medications (Other)  Medication Sig  . albuterol (VENTOLIN HFA)  108 (90 Base) MCG/ACT inhaler Inhale 2 puffs into the lungs every 4 (four) hours as needed for wheezing or shortness of breath.  . ALPRAZolam (XANAX) 0.5 MG tablet Take 1 tablet (0.5 mg total) by mouth 2 (two) times daily as needed for anxiety.  [name] buPROPion (WELLBUTRIN XL) 150 MG 24 hr tablet TAKE 1 TABLET BY MOUTH EVERY DAY  . escitalopram (LEXAPRO) 20 MG tablet TAKE 1 TABLET BY MOUTH EVERY DAY  . fluticasone (FLONASE) 50 MCG/ACT nasal spray Place 2 sprays into both nostrils daily.  . Fluticasone-Salmeterol (WIXELA INHUB) 250-50 MCG/DOSE AEPB Inhale 1 puff into the lungs in the morning and at bedtime.  . meloxicam (MOBIC) 7.5 MG tablet Take 7.5 mg by mouth daily.  . methylPREDNISolone (MEDROL DOSEPAK) 4 MG TBPK tablet Take by mouth.  [name] omeprazole (PRILOSEC) 40 MG capsule Take 40 mg by mouth daily.   . psyllium (METAMUCIL) 58.6 % powder Take 1 packet by mouth 2 (two) times daily.  . simvastatin (ZOCOR) 20 MG tablet TAKE 1 TABLET BY MOUTH EVERYDAY AT BEDTIME   No current facility-administered medications for this visit. (Other)      REVIEW OF SYSTEMS:    ALLERGIES Allergies  Allergen Reactions  . Levaquin [Levofloxacin] Other (See Comments)    LEG CRAMPS, CHILLS    PAST MEDICAL HISTORY Past Medical History:  Diagnosis Date  . Allergy   . Anxiety   .  Arthritis   . Cancer [facility])    prostate  . COPD (chronic obstructive pulmonary disease) (HCC)    mild; denies SOB with ADLs; no current med.  . Dental crowns present   . Depression   . Dyslipidemia    no current med.  [name] GERD (gastroesophageal reflux disease)    no current med.  [name] History of prostate cancer   . OSA on CPAP   . Prostate cancer ([place])   . Pulmonary nodules    "stable", per CT chest 06/08/2015  . S/P radiation therapy 12/15/2014 - 02/01/2015        . Sleep apnea    CPAP  . Stenosing tenosynovitis of finger 09/2015   bilateral index finger    Past Surgical History:  Procedure Laterality Date  . COLONOSCOPY  05-08-06   normal  . HYDROCELE EXCISION Right 03/30/2004  . INGUINAL HERNIA REPAIR Left   . INGUINAL HERNIA REPAIR Right 03/30/2004  . KNEE ARTHROSCOPY Bilateral    multiple   . LYMPHADENECTOMY Bilateral 03/26/2013   Procedure: LYMPHADENECTOMY;  Surgeon: Dutch Gray, MD;  [facility];  Service: Urology;  Laterality: Bilateral;  . NASAL CONCHA BULLOSA RESECTION Right 08/01/2000  . NASAL TURBINATE REDUCTION  08/01/2000  . ROBOT ASSISTED LAPAROSCOPIC RADICAL PROSTATECTOMY Bilateral 03/26/2013   Procedure: ROBOTIC ASSISTED LAPAROSCOPIC RADICAL PROSTATECTOMY LEVEL 2;  Surgeon: Dutch Gray, MD;  [facility];  Service: Urology;  Laterality: Bilateral;  3 HRS   . SEPTOPLASTY  08/01/2000  . TOTAL KNEE ARTHROPLASTY Left 07/15/2013   Dr Percell Miller  . TOTAL KNEE ARTHROPLASTY Left 07/15/2013   Procedure: TOTAL KNEE ARTHROPLASTY;  Surgeon: Ninetta Lights, MD;  Location: [place];  Service: Orthopedics;  Laterality: Left;  . TRIGGER FINGER RELEASE  09/04/2012   Procedure: MINOR RELEASE TRIGGER FINGER/A-1 PULLEY;  Surgeon: Cammie Sickle., MD;  Location: [place];  Service: Orthopedics;  Laterality: Left;  left long and cyst excision left long  . TRIGGER FINGER RELEASE Bilateral 12/09/2014   Procedure: RELEASE A-1 PULLEY RIGHT MIDDLE FINGER,RELEASE A-1 PULLEY LEFT RING FINGER;  Surgeon: Daryll Brod, MD;  Location: [place];  Service: Orthopedics;  Laterality: Bilateral;  . TRIGGER FINGER RELEASE Bilateral 09/30/2015   Procedure: RELEASE TRIGGER FINGER/A-1 PULLEY BILATERAL INDEX FINGERS;  Surgeon: Daryll Brod, MD;  Location: [place];  Service: Orthopedics;  Laterality: Bilateral;    FAMILY HISTORY Family History  Problem Relation Age of Onset  . Prostate cancer Father   . Rheumatic fever Paternal Grandmother   . Hyperlipidemia Sister   . Colon cancer Neg Hx   . Colon polyps Neg Hx   .  Esophageal cancer Neg Hx   . Rectal cancer Neg Hx   . Stomach cancer Neg Hx     SOCIAL HISTORY Social History   Tobacco Use  . Smoking status: Former Smoker    Packs/day: 1.00    Years: 20.00    Pack years: 20.00    Types: Cigarettes    Quit date: 12/03/1998    Years since quitting: 22.1  . Smokeless tobacco: Never Used  Vaping Use  . Vaping Use: Never used  Substance Use Topics  . Alcohol use: No    Alcohol/week: 0.0 standard drinks  . Drug use: No         OPHTHALMIC EXAM:  Base Eye Exam    Visual Acuity (Snellen - Linear)      Right Left   Dist cc 20/25 20/25  Correction: Glasses       Tonometry (Tonopen, 3:34 PM)      Right Left   Pressure 15 18       Pupils      Pupils Dark Light Shape React APD   Right PERRL 7 6 Round Slow None   Left PERRL 7 6 Round Slow None       Visual Fields (Counting fingers)      Left Right    Full    Restrictions  Total inferior nasal deficiency       Neuro/Psych    Oriented x3: Yes   Mood/Affect: Normal       Dilation    Both eyes: 1.0% Mydriacyl, 2.5% Phenylephrine @ 3:35 PM        Slit Lamp and Fundus Exam    External Exam      Right Left   External Normal Normal       Slit Lamp Exam      Right Left   Lids/Lashes Normal Normal   Conjunctiva/Sclera White and quiet White and quiet   Cornea Clear Clear   Anterior Chamber Deep and quiet Deep and quiet   Iris Round and reactive Round and reactive   Lens 2+ Nuclear sclerosis 2+ Nuclear sclerosis   Anterior Vitreous Normal Normal       Fundus Exam      Right Left   Posterior Vitreous Posterior vitreous detachment Normal   Disc 1+ Optic disc edema, pink edema, no pallor Normal   C/D Ratio 0.3 0.4   Macula Normal Normal   Vessels Slightly engorged retinal vein Normal   Periphery Normal Normal          IMAGING AND PROCEDURES  Imaging and Procedures for 01/05/21  OCT, Retina - OU - Both Eyes       Right Eye Quality was good. Scan locations  included subfoveal. Central Foveal Thickness: 275. Progression has no prior data. Findings include vitreomacular adhesion .   Left Eye Quality was good. Scan locations included subfoveal. Central Foveal Thickness: 272. Progression has no prior data. Findings include vitreomacular adhesion .   Notes Macular findings are entirely normal on each eye.  Optic nerve edema is is verified with slices through the optic nerve.       Color Fundus Photography Optos - OU - Both Eyes       Right Eye Progression has no prior data. Disc findings include edema.   Left Eye Progression has no prior data. Disc findings include normal observations. Macula : normal observations. Vessels : normal observations.   Notes Mild enlargement of the retinal vein yet with no findings of retinal vein occlusion.  OD.  Mild pink edema of the nerve.                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 Patient has a negative review of systems for temporal arteritis, that is good appetite remains, no weight loss, no problems chewing with pain jaw claudication, and no unusual headaches. He has had no transient visual obscurations of vision in either eye prior to this.  Nonetheless appropriate work-up for temporal arteritis will be undertaken with CBC, erythrocyte sedimentation rate and C-reactive protein. 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 This might in fact alter  a C-reactive protein and/or ESR.  Obstructive sleep apnea on CPAP I have encouraged the patient the most important single thing he can do to prevent progression of vascular disease in his eyes is to continue on CPAP use given the diagnosis of sleep apnea.  CSD (cat scratch  disease) Patient did have a recent cat scratch, unusual to be around cats.  This optic type of optic neuropathy can in fact be associated with this particular with cat scratch disease thus he will need check for Bartonella henselai antibody      ICD-10-CM   1. 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 Optic nerve edema  H47.10 OCT, Retina - OU - Both Eyes    Color Fundus Photography Optos - OU - Both Eyes    CBC    Sedimentation rate    C-reactive protein  3. Obstructive sleep apnea on CPAP  G47.33    Z99.89   4. CSD (cat scratch disease)  A28.1     1. We will recommend the commencement of use of aspirin 81 mg p.o. daily coated aspirin would be appropriate if better tolerated. We will also recommend daily highly refined natural Turmeric supplement.  2. Typical clinical course and therapy reviewed. Follow up Dr. Sharen Counter for visual field testing.  3.  Should laboratory testing be completed tomorrow, will follow up with the patient regarding the ESR and C-reactive protein  Find the patient that the Bartonella Hensley testing can take up to a week for returning  Ophthalmic Meds Ordered this visit:  No orders of the defined types were placed in this encounter.      Return in about 3 weeks (around 01/26/2021), or Dr. Zadie Rhine to examine prior to dilation, for COLOR FP, OCT.  There are no Patient Instructions on file for this visit.   Explained the diagnoses, plan, and follow up with the patient and they expressed understanding.  Patient expressed understanding of the importance of proper follow up care.   Clent Demark Lauramae Kneisley M.D. Diseases & Surgery of the Retina and Vitreous Retina & Diabetic [place] 01/05/21     Abbreviations: M myopia (nearsighted); A astigmatism; H hyperopia (farsighted); P  presbyopia; Mrx spectacle prescription;  CTL contact lenses; OD right eye; OS left eye; OU both eyes  XT exotropia; ET esotropia; PEK punctate epithelial keratitis; PEE punctate epithelial erosions; DES dry eye syndrome; MGD meibomian gland dysfunction; ATs artificial tears; PFAT's preservative free artificial tears; [place] nuclear sclerotic cataract; PSC posterior subcapsular cataract; ERM epi-retinal membrane; PVD posterior vitreous detachment; RD retinal detachment; DM diabetes mellitus; DR diabetic retinopathy; NPDR non-proliferative diabetic retinopathy; PDR proliferative diabetic retinopathy; CSME clinically significant macular edema; DME diabetic macular edema; dbh dot blot hemorrhages; CWS cotton wool spot; POAG primary open angle glaucoma; C/D cup-to-disc ratio; HVF humphrey visual field; GVF goldmann visual field; OCT optical coherence tomography; IOP intraocular pressure; BRVO Branch retinal vein occlusion; CRVO central retinal vein occlusion; CRAO central retinal artery occlusion; BRAO branch retinal artery occlusion; RT retinal tear; SB scleral buckle; PPV pars plana vitrectomy; VH Vitreous hemorrhage; PRP panretinal laser photocoagulation; IVK intravitreal kenalog; VMT vitreomacular traction; MH Macular hole;  NVD neovascularization of the disc; NVE neovascularization elsewhere; AREDS age related eye disease study; ARMD age related macular degeneration;  POAG primary open angle glaucoma; EBMD epithelial/anterior basement membrane dystrophy; ACIOL anterior chamber intraocular lens; IOL intraocular lens; PCIOL posterior chamber intraocular lens; Phaco/IOL phacoemulsification with intraocular lens placement; [place] photorefractive keratectomy; LASIK laser assisted in situ keratomileusis; HTN hypertension; DM diabetes mellitus; COPD chronic obstructive pulmonary disease

## 2021-01-06 ENCOUNTER — Other Ambulatory Visit: Payer: Self-pay

## 2021-01-06 ENCOUNTER — Ambulatory Visit
Admission: RE | Admit: 2021-01-06 | Discharge: 2021-01-06 | Disposition: A | Discharge status: Home / Self Care | Payer: PPO | Source: Ambulatory Visit | Attending: Radiation Oncology | Admitting: Radiation Oncology

## 2021-01-06 DIAGNOSIS — C61 Malignant neoplasm of prostate: Secondary | ICD-10-CM | POA: Diagnosis not present

## 2021-01-06 DIAGNOSIS — C775 Secondary and unspecified malignant neoplasm of intrapelvic lymph nodes: Secondary | ICD-10-CM | POA: Diagnosis not present

## 2021-01-06 DIAGNOSIS — Z510 Encounter for antineoplastic radiation therapy: Secondary | ICD-10-CM | POA: Diagnosis not present

## 2021-01-06 DIAGNOSIS — H4710 Unspecified papilledema: Secondary | ICD-10-CM | POA: Diagnosis not present

## 2021-01-06 DIAGNOSIS — H47011 Ischemic optic neuropathy, right eye: Secondary | ICD-10-CM | POA: Diagnosis not present

## 2021-01-09 ENCOUNTER — Ambulatory Visit
Admission: RE | Admit: 2021-01-09 | Discharge: 2021-01-09 | Disposition: A | Discharge status: Home / Self Care | Payer: PPO | Source: Ambulatory Visit | Attending: Radiation Oncology | Admitting: Radiation Oncology

## 2021-01-09 DIAGNOSIS — C775 Secondary and unspecified malignant neoplasm of intrapelvic lymph nodes: Secondary | ICD-10-CM | POA: Diagnosis not present

## 2021-01-09 DIAGNOSIS — Z510 Encounter for antineoplastic radiation therapy: Secondary | ICD-10-CM | POA: Diagnosis not present

## 2021-01-09 DIAGNOSIS — C61 Malignant neoplasm of prostate: Secondary | ICD-10-CM | POA: Diagnosis not present

## 2021-01-10 ENCOUNTER — Encounter: Payer: Self-pay | Admitting: Radiation Oncology

## 2021-01-10 ENCOUNTER — Ambulatory Visit
Admission: RE | Admit: 2021-01-10 | Discharge: 2021-01-10 | Disposition: A | Discharge status: Home / Self Care | Payer: PPO | Source: Ambulatory Visit | Attending: Radiation Oncology | Admitting: Radiation Oncology

## 2021-01-10 ENCOUNTER — Encounter: Payer: Self-pay | Admitting: Family Medicine

## 2021-01-10 DIAGNOSIS — C775 Secondary and unspecified malignant neoplasm of intrapelvic lymph nodes: Secondary | ICD-10-CM | POA: Diagnosis not present

## 2021-01-10 DIAGNOSIS — Z510 Encounter for antineoplastic radiation therapy: Secondary | ICD-10-CM | POA: Diagnosis not present

## 2021-01-10 DIAGNOSIS — C61 Malignant neoplasm of prostate: Secondary | ICD-10-CM | POA: Diagnosis not present

## 2021-01-11 ENCOUNTER — Ambulatory Visit: Payer: PPO | Admitting: Family Medicine

## 2021-01-13 ENCOUNTER — Other Ambulatory Visit: Payer: Self-pay | Admitting: Family Medicine

## 2021-01-16 DIAGNOSIS — H4710 Unspecified papilledema: Secondary | ICD-10-CM | POA: Diagnosis not present

## 2021-01-16 NOTE — Progress Notes (Signed)
  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     Indication for treatment:  Local Control, Curative       Radiation treatment dates:   1//26-01/10/21  Site/dose:   The PSMA positive nodes were treated to 50 Gy in 10 fractions of 5 Gy while the involved and intervening lymph node basin was treated to 30 Gy in 10 fractions of 3 Gy  Beams/energy:   Three VMAT beams delivered 10X FFF  Narrative: The patient tolerated radiation treatment relatively well.     Plan: The patient has completed radiation treatment. The patient will return to radiation oncology clinic for routine followup in one month. I advised him to call or return sooner if he has any questions or concerns related to his recovery or treatment. ________________________________  Sheral Apley. Tammi Klippel, M.D.

## 2021-01-26 ENCOUNTER — Encounter (INDEPENDENT_AMBULATORY_CARE_PROVIDER_SITE_OTHER): Payer: PPO | Admitting: Ophthalmology

## 2021-01-30 ENCOUNTER — Other Ambulatory Visit: Payer: Self-pay

## 2021-01-30 ENCOUNTER — Encounter: Payer: Self-pay | Admitting: Urology

## 2021-01-30 ENCOUNTER — Telehealth: Payer: Self-pay

## 2021-01-30 NOTE — Progress Notes (Signed)
Patient has telephone visit with Ashlyn Bruning. Patient states nocturia 4-5 times per night. Patient denies dysuria or hematuria. Patient states that he has constipation. Patient states urine stream is weak and steady. Patient states that he is not emptying his bladder completely and returning to the bathroom less than 2 hours later. . Patient states urgency and is able to hold urine. Patient denies fatigue since radiation treatment. Patient states having occasional leakage. Patient states that he does not have a follow-up with [facility].

## 2021-01-30 NOTE — Telephone Encounter (Signed)
Spoke with patient in regards to telephone visit with Freeman Caldron PA on 02/08/21 @ 10:00am. Patient verbalized understanding of appointment date and time. Reviewed Meaningful use , AUA and prostate questions. TM

## 2021-02-02 DIAGNOSIS — H25813 Combined forms of age-related cataract, bilateral: Secondary | ICD-10-CM | POA: Diagnosis not present

## 2021-02-02 DIAGNOSIS — H11823 Conjunctivochalasis, bilateral: Secondary | ICD-10-CM | POA: Diagnosis not present

## 2021-02-02 DIAGNOSIS — H47011 Ischemic optic neuropathy, right eye: Secondary | ICD-10-CM | POA: Diagnosis not present

## 2021-02-02 DIAGNOSIS — C61 Malignant neoplasm of prostate: Secondary | ICD-10-CM | POA: Diagnosis not present

## 2021-02-02 DIAGNOSIS — H4710 Unspecified papilledema: Secondary | ICD-10-CM | POA: Diagnosis not present

## 2021-02-06 NOTE — Telephone Encounter (Signed)
Patient would like to switch back to trelegy, due to wixela costing the same as trelegy.   OA, please advise. thanks

## 2021-02-08 ENCOUNTER — Ambulatory Visit
Admission: RE | Admit: 2021-02-08 | Discharge: 2021-02-08 | Disposition: A | Discharge status: Home / Self Care | Payer: PPO | Source: Ambulatory Visit | Attending: Urology | Admitting: Urology

## 2021-02-08 ENCOUNTER — Other Ambulatory Visit: Payer: Self-pay

## 2021-02-08 DIAGNOSIS — C61 Malignant neoplasm of prostate: Secondary | ICD-10-CM

## 2021-02-08 DIAGNOSIS — C775 Secondary and unspecified malignant neoplasm of intrapelvic lymph nodes: Secondary | ICD-10-CM

## 2021-02-08 MED ORDER — TRELEGY ELLIPTA 100-62.5-25 MCG/INH IN AEPB: 1.0000 | INHALATION_SPRAY | Freq: Every day | RESPIRATORY_TRACT | 5 refills | Status: AC

## 2021-02-08 NOTE — Progress Notes (Signed)
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   Interval Since Last Radiation:  4 weeks   1//26-01/10/21:  The PSMA positive nodes were treated to 50 Gy in 10 fractions of 5 Gy while the involved and intervening lymph node basin was treated to 30 Gy in 10 fractions of 3 Gy   Narrative:  I spoke with the patient to conduct his routine scheduled 1 month follow up visit via telephone to spare the patient unnecessary potential exposure in the healthcare setting during the current COVID-19 pandemic.  The patient was notified in advance and gave permission to proceed with this visit format. He tolerated radiation treatment relatively well without any ill side effects.                              On review of systems, the patient states that he is doing very well in general and is without complaints.  He specifically denies any dysuria, gross hematuria, excessive frequency, urgency, incomplete bladder emptying or incontinence.  He reports a healthy appetite and is maintaining his weight.  He denies abdominal pain, nausea, vomiting, diarrhea or constipation.  He has not noticed any effect in his energy level or stamina and has remained active.  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  This is being managed by his ophthalmologist, Dr. Zadie Rhine and is not felt to be related or associated with his previous radiotherapy.  ALLERGIES:  is allergic to levaquin [levofloxacin].  Meds: Current Outpatient Medications  Medication Sig Dispense Refill   . albuterol (VENTOLIN HFA) 108 (90 Base) MCG/ACT inhaler Inhale 2 puffs into the lungs every 4 (four) hours as needed for wheezing or shortness of breath. 1 each 11  . ALPRAZolam (XANAX) 0.5 MG tablet Take 1 tablet (0.5 mg total) by mouth 2 (two) times daily as needed for anxiety. 60 tablet 1  . brimonidine (ALPHAGAN) 0.2 % ophthalmic solution Place 1 drop into the right eye in the morning and at bedtime.    [name] buPROPion (WELLBUTRIN XL) 150 MG 24 hr tablet TAKE 1 TABLET BY MOUTH EVERY DAY 90 tablet 1  . escitalopram (LEXAPRO) 20 MG tablet TAKE 1 TABLET BY MOUTH EVERY DAY 90 tablet 1  . fluticasone (FLONASE) 50 MCG/ACT nasal spray Place 2 sprays into both nostrils daily. 16 g 6  . Fluticasone-Salmeterol (WIXELA INHUB) 250-50 MCG/DOSE AEPB Inhale 1 puff into the lungs in the morning and at bedtime. 60 each 3  . predniSONE (DELTASONE) 20 MG tablet Take by mouth.    . psyllium (METAMUCIL) 58.6 % powder Take 1 packet by mouth 2 (two) times daily.    . simvastatin (ZOCOR) 20 MG tablet TAKE 1 TABLET BY MOUTH EVERYDAY AT BEDTIME 90 tablet 2  . meloxicam (MOBIC) 7.5 MG tablet Take 7.5 mg by mouth daily.    . methylPREDNISolone (MEDROL DOSEPAK) 4 MG TBPK tablet Take by mouth.    [name] omeprazole (PRILOSEC) 40 MG capsule Take 40 mg by mouth daily.  No current facility-administered medications for this encounter.    Physical Findings:  vitals were not taken for this visit.   /10 Unable to assess due to telephone follow-up visit format.  Lab Findings: Lab Results  Component Value Date   WBC 5.0 11/15/2020   HGB 14.3 11/15/2020   HCT 42.3 11/15/2020   MCV 88.9 11/15/2020   PLT 240 11/15/2020     Radiographic Findings: No results found.  Impression/Plan: 20. 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   He appears to have recovered well from the effects of his recent SBRT treatments and is currently without complaints.  We discussed  that while we are happy to continue to participate in his care if clinically indicated, at this point, we will plan to see him back on an as-needed basis.  He will continue in routine follow-up under the care and direction of Dr. Alen Blew and has a scheduled follow-up visit on 02/16/2021.  He will have labs drawn tomorrow to be reviewed at that visit.  We enjoyed taking care of him and look forward to continuing to follow his progress via correspondence.  He knows that he is welcome to call at anytime with any questions or concerns related to his previous radiotherapy.    Nicholos Johns, PA-C

## 2021-02-08 NOTE — Telephone Encounter (Signed)
Okay to switch back to Trelegy

## 2021-02-09 ENCOUNTER — Other Ambulatory Visit: Payer: Self-pay

## 2021-02-09 ENCOUNTER — Inpatient Hospital Stay: Payer: PPO | Attending: Oncology

## 2021-02-09 DIAGNOSIS — R590 Localized enlarged lymph nodes: Secondary | ICD-10-CM | POA: Insufficient documentation

## 2021-02-09 DIAGNOSIS — G4733 Obstructive sleep apnea (adult) (pediatric): Secondary | ICD-10-CM | POA: Diagnosis not present

## 2021-02-09 DIAGNOSIS — C61 Malignant neoplasm of prostate: Secondary | ICD-10-CM | POA: Diagnosis not present

## 2021-02-09 DIAGNOSIS — E291 Testicular hypofunction: Secondary | ICD-10-CM | POA: Diagnosis not present

## 2021-02-09 LAB — CMP (CANCER CENTER ONLY)
ALT: 29 U/L (ref 0–44)
AST: 13 U/L — ABNORMAL LOW (ref 15–41)
Albumin: 3.8 g/dL (ref 3.5–5.0)
Alkaline Phosphatase: 61 U/L (ref 38–126)
Anion gap: 5 (ref 5–15)
BUN: 13 mg/dL (ref 8–23)
CO2: 26 mmol/L (ref 22–32)
Calcium: 8.6 mg/dL — ABNORMAL LOW (ref 8.9–10.3)
Chloride: 104 mmol/L (ref 98–111)
Creatinine: 0.8 mg/dL (ref 0.61–1.24)
GFR, Estimated: >60 mL/min (ref >60)
Glucose, Bld: 90 mg/dL (ref 70–99)
Potassium: 3.9 mmol/L (ref 3.5–5.1)
Sodium: 135 mmol/L (ref 135–145)
Total Bilirubin: 1.2 mg/dL (ref 0.3–1.2)
Total Protein: 6.2 g/dL — ABNORMAL LOW (ref 6.5–8.1)

## 2021-02-09 LAB — CBC WITH DIFFERENTIAL (CANCER CENTER ONLY)
Abs Immature Granulocytes: 0.09 10*3/uL — ABNORMAL HIGH (ref 0.00–0.07)
Basophils Absolute: 0 10*3/uL (ref 0.0–0.1)
Basophils Relative: 0 %
Eosinophils Absolute: 0 10*3/uL (ref 0.0–0.5)
Eosinophils Relative: 0 %
HCT: 39.6 % (ref 39.0–52.0)
Hemoglobin: 13.6 g/dL (ref 13.0–17.0)
Immature Granulocytes: 1 %
Lymphocytes Relative: 4 %
Lymphs Abs: 0.4 10*3/uL — ABNORMAL LOW (ref 0.7–4.0)
MCH: 30.9 pg (ref 26.0–34.0)
MCHC: 34.3 g/dL (ref 30.0–36.0)
MCV: 90 fL (ref 80.0–100.0)
Monocytes Absolute: 0.5 10*3/uL (ref 0.1–1.0)
Monocytes Relative: 5 %
Neutro Abs: 8.1 10*3/uL — ABNORMAL HIGH (ref 1.7–7.7)
Neutrophils Relative %: 90 %
Platelet Count: 186 10*3/uL (ref 150–400)
RBC: 4.4 MIL/uL (ref 4.22–5.81)
RDW: 13.5 % (ref 11.5–15.5)
WBC Count: 9 10*3/uL (ref 4.0–10.5)
nRBC: 0 % (ref 0.0–0.2)

## 2021-02-10 LAB — PROSTATE-SPECIFIC AG, SERUM (LABCORP): Prostate Specific Ag, Serum: <0.1 ng/mL (ref 0.0–4.0)

## 2021-02-16 ENCOUNTER — Inpatient Hospital Stay (HOSPITAL_BASED_OUTPATIENT_CLINIC_OR_DEPARTMENT_OTHER): Payer: PPO | Admitting: Oncology

## 2021-02-16 ENCOUNTER — Other Ambulatory Visit: Payer: Self-pay

## 2021-02-16 VITALS — BP 111/74 | HR 75 | Temp 98.1°F | Resp 14 | Ht 71.0 in | Wt 189.1 lb

## 2021-02-16 DIAGNOSIS — C61 Malignant neoplasm of prostate: Secondary | ICD-10-CM | POA: Diagnosis not present

## 2021-02-16 NOTE — Progress Notes (Signed)
Hematology and Oncology Follow Up Visit  Samuel Richmond [id number] [birth year] 65 y.o. 02/16/2021 1:27 PM Samuel Richmond, MDTabori, Samuel Millet, MD   Principle Diagnosis: 65 year old man with T3a N0 prostate cancer diagnosed in 2014.  He was found to have a Gleason score 4+3 = 7.  He developed advanced disease with 2 nodal metastasis in the right pelvis diagnosed in January 2022.   Prior Therapy:  He underwent a radical prostatectomy and bilateral lymphadenectomy on March 26, 2013 under the care of Samuel Richmond the final pathology showed prostate cancer Gleason score 4+3 = 7 involves both lobes with focal extraprostatic extension noted.  No evidence of seminal vesicle involvement and 0 out of 9 lymph nodes involved.  The final pathological staging was T3aN0.     He developed biochemical relapse in November 2017 with a PSA of 0.17.  He received radiation therapy under the care of Samuel Richmond between January and March 2016 with undetectable PSA after that.   He developed biochemical relapse in 2019 with a PSA of 0.76 in April 2019.  His PSA was up to 1.98 in 2021, in August 2021 was 2.5 and in November 15, 2020 was 3.4.   He is a status post radiation therapy to pelvic lymph nodes with 50 Gray in 10 fractions with additional 30 Gray in 10 fractions to the lymph node basin completed in February 2022.   Current therapy: Active surveillance.  Interim History: Samuel Richmond is here for repeat follow-up.  Since the last visit, he reports no major changes in his health.  He received radiation therapy without any major complaints at this time.  He denies any nausea, vomiting or abdominal pain.  He denies any changes in his bowel habits.  His performance status quality of life remains excellent.     Medications: Unchanged on review. Current Outpatient Medications  Medication Sig Dispense Refill  . albuterol (VENTOLIN HFA) 108 (90 Base) MCG/ACT inhaler Inhale 2 puffs into the lungs every 4  (four) hours as needed for wheezing or shortness of breath. 1 each 11  . ALPRAZolam (XANAX) 0.5 MG tablet Take 1 tablet (0.5 mg total) by mouth 2 (two) times daily as needed for anxiety. 60 tablet 1  . brimonidine (ALPHAGAN) 0.2 % ophthalmic solution Place 1 drop into the right eye in the morning and at bedtime.    [name] buPROPion (WELLBUTRIN XL) 150 MG 24 hr tablet TAKE 1 TABLET BY MOUTH EVERY DAY 90 tablet 1  . escitalopram (LEXAPRO) 20 MG tablet TAKE 1 TABLET BY MOUTH EVERY DAY 90 tablet 1  . fluticasone (FLONASE) 50 MCG/ACT nasal spray Place 2 sprays into both nostrils daily. 16 g 6  . Fluticasone-Umeclidin-Vilant (TRELEGY ELLIPTA) 100-62.5-25 MCG/INH AEPB Inhale 1 puff into the lungs daily. 60 each 5  . meloxicam (MOBIC) 7.5 MG tablet Take 7.5 mg by mouth daily.    . methylPREDNISolone (MEDROL DOSEPAK) 4 MG TBPK tablet Take by mouth.    [name] omeprazole (PRILOSEC) 40 MG capsule Take 40 mg by mouth daily.     . predniSONE (DELTASONE) 20 MG tablet Take by mouth.    . psyllium (METAMUCIL) 58.6 % powder Take 1 packet by mouth 2 (two) times daily.    . simvastatin (ZOCOR) 20 MG tablet TAKE 1 TABLET BY MOUTH EVERYDAY AT BEDTIME 90 tablet 2   No current facility-administered medications for this visit.     Allergies:  Allergies  Allergen Reactions  . Levaquin [Levofloxacin] Other (See Comments)  LEG CRAMPS, CHILLS      Physical Exam: Blood pressure 111/74, pulse 75, temperature 98.1 F (36.7 C), temperature source Tympanic, resp. rate 14, height 5\' 11"  (1.803 m), weight 189 lb 1.6 oz (85.8 kg), SpO2 99 %.    ECOG: 0    General appearance: Alert, awake without any distress. Head: Atraumatic without abnormalities Oropharynx: Without any thrush or ulcers. Eyes: No scleral icterus. Lymph nodes: No lymphadenopathy noted in the cervical, supraclavicular, or axillary nodes Heart:regular rate and rhythm, without any murmurs or gallops.   Lung: Clear to auscultation without any rhonchi,  wheezes or dullness to percussion. Abdomin: Soft, nontender without any shifting dullness or ascites. Musculoskeletal: No clubbing or cyanosis. Neurological: No motor or sensory deficits. Skin: No rashes or lesions.     Lab Results: Lab Results  Component Value Date   WBC 9.0 02/09/2021   HGB 13.6 02/09/2021   HCT 39.6 02/09/2021   MCV 90.0 02/09/2021   PLT 186 02/09/2021     Chemistry      Component Value Date/Time   NA 135 02/09/2021 0955   NA 139 05/17/2015 0000   K 3.9 02/09/2021 0955   CL 104 02/09/2021 0955   CO2 26 02/09/2021 0955   BUN 13 02/09/2021 0955   BUN 16 05/17/2015 0000   CREATININE 0.80 02/09/2021 0955   GLU 89 05/17/2015 0000      Component Value Date/Time   CALCIUM 8.6 (L) 02/09/2021 0955   ALKPHOS 61 02/09/2021 0955   AST 13 (L) 02/09/2021 0955   ALT 29 02/09/2021 0955   BILITOT 1.2 02/09/2021 0955           Impression and Plan:   65 year old man with:  1.    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   His disease status was updated at this time laboratory data from March symptoms were reviewed.  His PSA is currently undetectable after receiving radiation therapy.  Treatment options moving forward were discussed.  Androgen deprivation therapy as well is therapy escalation with androgen synthesis inhibitors were discussed at this time.  Continued active surveillance would be reasonable as well and initiating androgen deprivation therapy his PSA starts to rise again.  He is agreeable to proceed with this plan.  We discussed today in detail a surveillance protocol including PSA every 3 months with repeat imaging studies if his PSA starts to rise.  We also went discussed cut off to start androgen deprivation therapy which will be dictated by PSA rise velocity and imaging studies.    2.  Follow-up: In 3 months for repeat evaluation.  30  minutes were spent on this  visit.  Time was dedicated to reviewing laboratory data, treatment options and outlining future plan of care.   Samuel Button, MD 3/17/20221:27 PM

## 2021-02-17 ENCOUNTER — Telehealth: Payer: Self-pay | Admitting: Oncology

## 2021-02-17 NOTE — Telephone Encounter (Signed)
Scheduled follow-up appointments per 3/17 los. Patient is aware. ?

## 2021-02-24 DIAGNOSIS — D2261 Melanocytic nevi of right upper limb, including shoulder: Secondary | ICD-10-CM | POA: Diagnosis not present

## 2021-02-24 DIAGNOSIS — L859 Epidermal thickening, unspecified: Secondary | ICD-10-CM | POA: Diagnosis not present

## 2021-02-24 DIAGNOSIS — B078 Other viral warts: Secondary | ICD-10-CM | POA: Diagnosis not present

## 2021-02-24 DIAGNOSIS — L570 Actinic keratosis: Secondary | ICD-10-CM | POA: Diagnosis not present

## 2021-02-24 DIAGNOSIS — L821 Other seborrheic keratosis: Secondary | ICD-10-CM | POA: Diagnosis not present

## 2021-02-24 DIAGNOSIS — L219 Seborrheic dermatitis, unspecified: Secondary | ICD-10-CM | POA: Diagnosis not present

## 2021-02-24 DIAGNOSIS — B079 Viral wart, unspecified: Secondary | ICD-10-CM | POA: Diagnosis not present

## 2021-02-24 DIAGNOSIS — L578 Other skin changes due to chronic exposure to nonionizing radiation: Secondary | ICD-10-CM | POA: Diagnosis not present

## 2021-02-24 DIAGNOSIS — D225 Melanocytic nevi of trunk: Secondary | ICD-10-CM | POA: Diagnosis not present

## 2021-02-24 DIAGNOSIS — D485 Neoplasm of uncertain behavior of skin: Secondary | ICD-10-CM | POA: Diagnosis not present

## 2021-02-24 DIAGNOSIS — Z808 Family history of malignant neoplasm of other organs or systems: Secondary | ICD-10-CM | POA: Diagnosis not present

## 2021-03-10 DIAGNOSIS — G4733 Obstructive sleep apnea (adult) (pediatric): Secondary | ICD-10-CM | POA: Diagnosis not present

## 2021-03-23 DIAGNOSIS — H11823 Conjunctivochalasis, bilateral: Secondary | ICD-10-CM | POA: Diagnosis not present

## 2021-03-23 DIAGNOSIS — H25813 Combined forms of age-related cataract, bilateral: Secondary | ICD-10-CM | POA: Diagnosis not present

## 2021-03-23 DIAGNOSIS — H47011 Ischemic optic neuropathy, right eye: Secondary | ICD-10-CM | POA: Diagnosis not present

## 2021-03-23 DIAGNOSIS — H4710 Unspecified papilledema: Secondary | ICD-10-CM | POA: Diagnosis not present

## 2021-03-23 DIAGNOSIS — Z79899 Other long term (current) drug therapy: Secondary | ICD-10-CM | POA: Diagnosis not present

## 2021-03-24 DIAGNOSIS — H4710 Unspecified papilledema: Secondary | ICD-10-CM | POA: Diagnosis not present

## 2021-04-02 ENCOUNTER — Other Ambulatory Visit: Payer: Self-pay | Admitting: Family Medicine

## 2021-04-04 ENCOUNTER — Encounter: Payer: Self-pay | Admitting: Family Medicine

## 2021-04-04 ENCOUNTER — Other Ambulatory Visit: Payer: Self-pay

## 2021-04-04 MED ORDER — BUPROPION HCL ER (XL) 150 MG PO TB24: 1.0000 | ORAL_TABLET | Freq: Every day | ORAL | 0 refills | Status: DC

## 2021-04-10 DIAGNOSIS — H25813 Combined forms of age-related cataract, bilateral: Secondary | ICD-10-CM | POA: Diagnosis not present

## 2021-04-10 DIAGNOSIS — H11823 Conjunctivochalasis, bilateral: Secondary | ICD-10-CM | POA: Diagnosis not present

## 2021-04-10 DIAGNOSIS — H47011 Ischemic optic neuropathy, right eye: Secondary | ICD-10-CM | POA: Diagnosis not present

## 2021-04-10 DIAGNOSIS — Z9989 Dependence on other enabling machines and devices: Secondary | ICD-10-CM | POA: Diagnosis not present

## 2021-04-17 DIAGNOSIS — R5383 Other fatigue: Secondary | ICD-10-CM | POA: Diagnosis not present

## 2021-04-17 DIAGNOSIS — G4733 Obstructive sleep apnea (adult) (pediatric): Secondary | ICD-10-CM

## 2021-04-17 DIAGNOSIS — F419 Anxiety disorder, unspecified: Secondary | ICD-10-CM | POA: Diagnosis not present

## 2021-04-18 NOTE — Telephone Encounter (Signed)
AO please advise. Thanks  Dr. Jenetta Downer  I am currently being treated by [facility] for Non-arteritic anterior ischemic optic neuropathy (NAION). 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 Although little is know about this condition they suspect night time O2 desaturation might be a contributing factor. I picked up my new cpap today and the tech suggested I get a night time at home O & O study. What do you think?   Thank you

## 2021-04-19 NOTE — Telephone Encounter (Signed)
ONO order has been placed and message sent to patient.

## 2021-04-19 NOTE — Telephone Encounter (Signed)
Order overnight oximetry  This is to be performed with patient on CPAP  This will reassure [place] your oxygen levels are being kept where it needs to be

## 2021-04-26 DIAGNOSIS — G4733 Obstructive sleep apnea (adult) (pediatric): Secondary | ICD-10-CM | POA: Diagnosis not present

## 2021-04-27 DIAGNOSIS — H11823 Conjunctivochalasis, bilateral: Secondary | ICD-10-CM | POA: Diagnosis not present

## 2021-04-27 DIAGNOSIS — Z7952 Long term (current) use of systemic steroids: Secondary | ICD-10-CM | POA: Diagnosis not present

## 2021-04-27 DIAGNOSIS — H47011 Ischemic optic neuropathy, right eye: Secondary | ICD-10-CM | POA: Diagnosis not present

## 2021-04-27 DIAGNOSIS — H25813 Combined forms of age-related cataract, bilateral: Secondary | ICD-10-CM | POA: Diagnosis not present

## 2021-04-28 ENCOUNTER — Other Ambulatory Visit: Payer: Self-pay | Admitting: Family Medicine

## 2021-04-28 DIAGNOSIS — H5340 Unspecified visual field defects: Secondary | ICD-10-CM | POA: Diagnosis not present

## 2021-04-28 DIAGNOSIS — H47011 Ischemic optic neuropathy, right eye: Secondary | ICD-10-CM | POA: Diagnosis not present

## 2021-04-28 DIAGNOSIS — H4710 Unspecified papilledema: Secondary | ICD-10-CM | POA: Diagnosis not present

## 2021-05-04 ENCOUNTER — Encounter: Payer: Self-pay | Admitting: Pulmonary Disease

## 2021-05-04 DIAGNOSIS — R0683 Snoring: Secondary | ICD-10-CM | POA: Diagnosis not present

## 2021-05-04 DIAGNOSIS — G4730 Sleep apnea, unspecified: Secondary | ICD-10-CM | POA: Diagnosis not present

## 2021-05-09 ENCOUNTER — Telehealth: Payer: Self-pay | Admitting: Pulmonary Disease

## 2021-05-09 DIAGNOSIS — G4733 Obstructive sleep apnea (adult) (pediatric): Secondary | ICD-10-CM

## 2021-05-09 NOTE — Telephone Encounter (Signed)
There is some desaturations noted on the overnight oximetry,however, there is a lot of artifacts on the study with significant dips in oxygen saturations which appear to be artifactual  I believe repeating the study is the most appropriate course of action There are areas on the study showing very mild desaturations which may be real  As stated above repeating the study is most appropriate   Schedule for repeat overnight oximetry with CPAP in place if okay with patient

## 2021-05-09 NOTE — Telephone Encounter (Signed)
Called and spoke with Patient.  Dr. Judson Roch results and recommendations given. Understanding stated.  Patient agreed to repeat ONO with cpap.  ONO order placed.  Nothing further at this time.

## 2021-05-10 DIAGNOSIS — G4733 Obstructive sleep apnea (adult) (pediatric): Secondary | ICD-10-CM | POA: Diagnosis not present

## 2021-05-17 ENCOUNTER — Inpatient Hospital Stay: Payer: PPO | Attending: Oncology

## 2021-05-17 ENCOUNTER — Other Ambulatory Visit: Payer: Self-pay

## 2021-05-17 DIAGNOSIS — C61 Malignant neoplasm of prostate: Secondary | ICD-10-CM

## 2021-05-17 LAB — CMP (CANCER CENTER ONLY)
ALT: 19 U/L (ref 0–44)
AST: 16 U/L (ref 15–41)
Albumin: 3.8 g/dL (ref 3.5–5.0)
Alkaline Phosphatase: 64 U/L (ref 38–126)
Anion gap: 8 (ref 5–15)
BUN: 14 mg/dL (ref 8–23)
CO2: 24 mmol/L (ref 22–32)
Calcium: 9.3 mg/dL (ref 8.9–10.3)
Chloride: 107 mmol/L (ref 98–111)
Creatinine: 0.76 mg/dL (ref 0.61–1.24)
GFR, Estimated: >60 mL/min (ref >60)
Glucose, Bld: 92 mg/dL (ref 70–99)
Potassium: 3.9 mmol/L (ref 3.5–5.1)
Sodium: 139 mmol/L (ref 135–145)
Total Bilirubin: 0.7 mg/dL (ref 0.3–1.2)
Total Protein: 6.5 g/dL (ref 6.5–8.1)

## 2021-05-17 LAB — CBC WITH DIFFERENTIAL (CANCER CENTER ONLY)
Abs Immature Granulocytes: 0.02 10*3/uL (ref 0.00–0.07)
Basophils Absolute: 0 10*3/uL (ref 0.0–0.1)
Basophils Relative: 0 %
Eosinophils Absolute: 0.1 10*3/uL (ref 0.0–0.5)
Eosinophils Relative: 2 %
HCT: 38.7 % — ABNORMAL LOW (ref 39.0–52.0)
Hemoglobin: 13.1 g/dL (ref 13.0–17.0)
Immature Granulocytes: 0 %
Lymphocytes Relative: 13 %
Lymphs Abs: 0.6 10*3/uL — ABNORMAL LOW (ref 0.7–4.0)
MCH: 30.3 pg (ref 26.0–34.0)
MCHC: 33.9 g/dL (ref 30.0–36.0)
MCV: 89.4 fL (ref 80.0–100.0)
Monocytes Absolute: 0.4 10*3/uL (ref 0.1–1.0)
Monocytes Relative: 9 %
Neutro Abs: 3.8 10*3/uL (ref 1.7–7.7)
Neutrophils Relative %: 76 %
Platelet Count: 232 10*3/uL (ref 150–400)
RBC: 4.33 MIL/uL (ref 4.22–5.81)
RDW: 12.4 % (ref 11.5–15.5)
WBC Count: 5 10*3/uL (ref 4.0–10.5)
nRBC: 0 % (ref 0.0–0.2)

## 2021-05-18 ENCOUNTER — Encounter: Payer: Self-pay | Admitting: Family Medicine

## 2021-05-18 ENCOUNTER — Ambulatory Visit (INDEPENDENT_AMBULATORY_CARE_PROVIDER_SITE_OTHER): Payer: PPO | Admitting: Family Medicine

## 2021-05-18 VITALS — BP 120/78 | HR 68 | Temp 98.1°F | Resp 18 | Ht 71.5 in | Wt 199.4 lb

## 2021-05-18 DIAGNOSIS — G4733 Obstructive sleep apnea (adult) (pediatric): Secondary | ICD-10-CM | POA: Diagnosis not present

## 2021-05-18 DIAGNOSIS — F418 Other specified anxiety disorders: Secondary | ICD-10-CM | POA: Diagnosis not present

## 2021-05-18 DIAGNOSIS — E785 Hyperlipidemia, unspecified: Secondary | ICD-10-CM | POA: Diagnosis not present

## 2021-05-18 DIAGNOSIS — F419 Anxiety disorder, unspecified: Secondary | ICD-10-CM | POA: Diagnosis not present

## 2021-05-18 DIAGNOSIS — R5383 Other fatigue: Secondary | ICD-10-CM | POA: Diagnosis not present

## 2021-05-18 DIAGNOSIS — Z0000 Encounter for general adult medical examination without abnormal findings: Secondary | ICD-10-CM

## 2021-05-18 LAB — LIPID PANEL
Cholesterol: 152 mg/dL (ref 0–200)
HDL: 40.3 mg/dL (ref >39.00)
NonHDL: 111.29
Total CHOL/HDL Ratio: 4
Triglycerides: 309 mg/dL — ABNORMAL HIGH (ref 0.0–149.0)
VLDL: 61.8 mg/dL — ABNORMAL HIGH (ref 0.0–40.0)

## 2021-05-18 LAB — PROSTATE-SPECIFIC AG, SERUM (LABCORP): Prostate Specific Ag, Serum: <0.1 ng/mL (ref 0.0–4.0)

## 2021-05-18 LAB — LDL CHOLESTEROL, DIRECT: Direct LDL: 75 mg/dL

## 2021-05-18 LAB — TSH: TSH: 2.89 u[IU]/mL (ref 0.35–4.50)

## 2021-05-18 MED ORDER — ESCITALOPRAM OXALATE 5 MG PO TABS
5.0000 mg | ORAL_TABLET | Freq: Every day | ORAL | 1 refills | Status: DC
Start: 2021-05-18 — End: 2021-07-12

## 2021-05-18 MED ORDER — BUPROPION HCL 75 MG PO TABS: 75.0000 mg | ORAL_TABLET | Freq: Two times a day (BID) | ORAL | 1 refills | Status: DC

## 2021-05-18 NOTE — Assessment & Plan Note (Signed)
Chronic problem.  Tolerating statin w/o difficulty.  Check labs.  Adjust meds prn  

## 2021-05-18 NOTE — Assessment & Plan Note (Signed)
Pt's PE WNL w/ exception of weight gain.  UTD on colonoscopy, immunizations.  Check labs.  Anticipatory guidance provided.

## 2021-05-18 NOTE — Assessment & Plan Note (Signed)
Pt is interested in weaning off medication.  He reports he weaned down to 5mg  daily w/o difficulty but when he attempted to take every other day, anxiety was very high.  'it was a really bad day'.  He wants to wean both the Lexapro and the Wellbutrin.  Schedule was provided for him to do this and he was instructed that at any time during the process if anxiety or depression spikes, he is to go back up to the prior dose.  Pt expressed understanding and is in agreement w/ plan.

## 2021-05-18 NOTE — Progress Notes (Signed)
   Subjective:    Patient ID: Samuel Richmond, male    DOB: [birth year], 65 y.o.   MRN: [id number]  HPI CPE- UTD on Tdap, flu, COVID, Prevnar 20.  UTD on colonoscopy.  Reviewed past medical, surgical, family and social histories.   Patient Care Team    Relationship Specialty Notifications Start End  Midge Minium, MD PCP - General Family Medicine  09/23/14   Raynelle Bring, MD Consulting Physician Urology  06/01/15   Milus Banister, MD Attending Physician Gastroenterology  06/03/15   Tanda Rockers, MD Consulting Physician Pulmonary Disease  06/19/16   Kerry Fort., MD Consulting Physician Urology  05/15/19     Health Maintenance  Topic Date Due   PNA vac Low Risk Adult (1 of 2 - PCV13) 03/17/2019   INFLUENZA VACCINE  07/03/2021   COVID-19 Vaccine (5 - Booster for Pfizer series) 08/25/2021   COLONOSCOPY (Pts 45-38yrs Insurance coverage will need to be confirmed)  06/15/2026   TETANUS/TDAP  12/31/2028   Hepatitis C Screening  Completed   Zoster Vaccines- Shingrix  Completed   HPV VACCINES  Aged Out      Review of Systems Patient reports no hearing changes, anorexia, fever ,adenopathy, persistant/recurrent hoarseness, swallowing issues, chest pain, palpitations, edema, persistant/recurrent cough, hemoptysis, dyspnea (rest,exertional, paroxysmal nocturnal), gastrointestinal  bleeding (melena, rectal bleeding), abdominal pain, excessive heart burn, GU symptoms (dysuria, hematuria, voiding/incontinence issues) syncope, focal weakness, memory loss, numbness & tingling, skin/hair/nail changes, depression, anxiety, abnormal bruising/bleeding, musculoskeletal symptoms/signs.   + vision changes- optic neuropathy (NAAION)  This visit occurred during the SARS-CoV-2 public health emergency.  Safety protocols were in place, including screening questions prior to the visit, additional usage of staff PPE, and extensive cleaning of exam room while observing appropriate contact time as  indicated for disinfecting solutions.      Objective:   Physical Exam General Appearance:    Alert, cooperative, no distress, appears stated age  Head:    Normocephalic, without obvious abnormality, atraumatic  Eyes:    PERRL, conjunctiva/corneas clear, EOM's intact, fundi    benign, both eyes       Ears:    Normal TM's and external ear canals, both ears  Nose:   Deferred due to COVID  Throat:   Neck:   Supple, symmetrical, trachea midline, no adenopathy;       thyroid:  No enlargement/tenderness/nodules  Back:     Symmetric, no curvature, ROM normal, no CVA tenderness  Lungs:     Clear to auscultation bilaterally, respirations unlabored  Chest wall:    No tenderness or deformity  Heart:    Regular rate and rhythm, S1 and S2 normal, no murmur, rub   or gallop  Abdomen:     Soft, non-tender, bowel sounds active all four quadrants,    no masses, no organomegaly  Genitalia:    Deferred to Urology  Rectal:    Extremities:   Extremities normal, atraumatic, no cyanosis or edema  Pulses:   2+ and symmetric all extremities  Skin:   Skin color, texture, turgor normal, no rashes or lesions  Lymph nodes:   Cervical, supraclavicular, and axillary nodes normal  Neurologic:   CNII-XII intact. Normal strength, sensation and reflexes      throughout          Assessment & Plan:

## 2021-05-18 NOTE — Patient Instructions (Signed)
Follow up in 6 months to recheck cholesterol We'll notify you of your lab results and make any changes Continue to work on healthy diet and regular exercise- you can do it! DECREASE the Lexapro to 5mg  daily- 1 of the new prescription.  If doing well on 5 mg after 1 month, decrease to 1/2 tab (2.5mg  daily) DECREASE the Wellbutrin to 75mg  twice daily.  If doing well on that after 4-6 weeks, decrease to 75mg  once daily in the morning Call with any questions or concerns Stay Safe!  Stay Healthy! Have a great summer!!!

## 2021-05-19 ENCOUNTER — Inpatient Hospital Stay (HOSPITAL_BASED_OUTPATIENT_CLINIC_OR_DEPARTMENT_OTHER): Payer: PPO | Admitting: Oncology

## 2021-05-19 DIAGNOSIS — C61 Malignant neoplasm of prostate: Secondary | ICD-10-CM | POA: Diagnosis not present

## 2021-05-19 NOTE — Progress Notes (Signed)
Hematology and Oncology Follow Up for Telemedicine Visits  Samuel Richmond [id number] 1956-01-13 65 y.o. 05/19/2021 8:40 AM Midge Minium, MDTabori, Aundra Millet, MD   I connected with Samuel Richmond on 05/19/21 at  9:30 AM EDT by telephone visit and verified that I am speaking with the correct person using two identifiers.   I discussed the limitations, risks, security and privacy concerns of performing an evaluation and management service by telemedicine and the availability of in-person appointments. I also discussed with the patient that there may be a patient responsible charge related to this service. The patient expressed understanding and agreed to proceed.  Other persons participating in the visit and their role in the encounter:  None  Patient's location: Home Provider's location: Office       Principle Diagnosis: 65 year old man with castration sensitive advanced prostate cancer with pelvic lymphadenopathy documented in February 2022.  He initially presented with T3a N0 prostate cancer diagnosed in 2014 with Gleason score 4+3 = 7.       Prior Therapy:   He underwent a radical prostatectomy and bilateral lymphadenectomy on March 26, 2013 under the care of Dr. Alinda Money the final pathology showed prostate cancer Gleason score 4+3 = 7 involves both lobes with focal extraprostatic extension noted.  No evidence of seminal vesicle involvement and 0 out of 9 lymph nodes involved.  The final pathological staging was T3aN0.       He developed biochemical relapse in November 2017 with a PSA of 0.17.  He received radiation therapy under the care of Dr. Valere Dross between January and March 2016 with undetectable PSA after that.     He developed biochemical relapse in 2019 with a PSA of 0.76 in April 2019.  His PSA was up to 1.98 in 2021, in August 2021 was 2.5 and in November 15, 2020 was 3.4.   He is a status post radiation therapy to pelvic lymph nodes with 50 Gray in 10 fractions with  additional 30 Gray in 10 fractions to the lymph node basin completed in February 2022.     Current therapy: Active surveillance.   Interim History: Samuel Richmond reports feeling well without any major complaints.  He denies any abdominal pain or discomfort.  He denies any residual complications related to radiation therapy.  He denies any urinary difficulties.  Medications: I have reviewed the patient's current medications.  Current Outpatient Medications  Medication Sig Dispense Refill   albuterol (VENTOLIN HFA) 108 (90 Base) MCG/ACT inhaler Inhale 2 puffs into the lungs every 4 (four) hours as needed for wheezing or shortness of breath. 1 each 11   ALPRAZolam (XANAX) 0.5 MG tablet Take 1 tablet (0.5 mg total) by mouth 2 (two) times daily as needed for anxiety. 60 tablet 1   brimonidine (ALPHAGAN) 0.2 % ophthalmic solution Place 1 drop into the right eye in the morning and at bedtime.     buPROPion (WELLBUTRIN) 75 MG tablet Take 1 tablet (75 mg total) by mouth 2 (two) times daily. 180 tablet 1   escitalopram (LEXAPRO) 5 MG tablet Take 1 tablet (5 mg total) by mouth daily. 90 tablet 1   fluticasone (FLONASE) 50 MCG/ACT nasal spray Place 2 sprays into both nostrils daily. 16 g 6   Fluticasone-Umeclidin-Vilant (TRELEGY ELLIPTA) 100-62.5-25 MCG/INH AEPB Inhale 1 puff into the lungs daily. 60 each 5   meloxicam (MOBIC) 7.5 MG tablet Take 7.5 mg by mouth daily.     methylPREDNISolone (MEDROL DOSEPAK) 4 MG TBPK tablet Take by  mouth.     omeprazole (PRILOSEC) 40 MG capsule Take 40 mg by mouth daily.      PREVNAR 20 0.5 ML SUSY      psyllium (METAMUCIL) 58.6 % powder Take 1 packet by mouth 2 (two) times daily.     QUICKVUE AT-HOME COVID-19 TEST KIT See admin instructions.     simvastatin (ZOCOR) 20 MG tablet TAKE 1 TABLET BY MOUTH EVERYDAY AT BEDTIME 90 tablet 2   No current facility-administered medications for this visit.     Allergies:  Allergies  Allergen Reactions   Levaquin [Levofloxacin]  Other (See Comments)    LEG CRAMPS, CHILLS        Lab Results: Lab Results  Component Value Date   WBC 5.0 05/17/2021   HGB 13.1 05/17/2021   HCT 38.7 (L) 05/17/2021   MCV 89.4 05/17/2021   PLT 232 05/17/2021     Chemistry      Component Value Date/Time   NA 139 05/17/2021 1234   NA 139 05/17/2015 0000   K 3.9 05/17/2021 1234   CL 107 05/17/2021 1234   CO2 24 05/17/2021 1234   BUN 14 05/17/2021 1234   BUN 16 05/17/2015 0000   CREATININE 0.76 05/17/2021 1234   GLU 89 05/17/2015 0000      Component Value Date/Time   CALCIUM 9.3 05/17/2021 1234   ALKPHOS 64 05/17/2021 1234   AST 16 05/17/2021 1234   ALT 19 05/17/2021 1234   BILITOT 0.7 05/17/2021 1234      Results for Samuel Richmond, Samuel Richmond (MRN [id number]) as of 05/19/2021 08:37  Ref. Range 02/09/2021 09:55 05/17/2021 12:34  Prostate Specific Ag, Serum Latest Ref Range: 0.0 - 4.0 ng/mL <0.1 <0.1     Impression and Plan:  65 year old man with:   1.    Advanced prostate cancer with pelvic adenopathy currently with castration-sensitive disease and definitively treated oligometastatic disease.     His disease status was updated at this time and treatment options were reviewed.  Laboratory testing continues to show a PSA that is undetectable.  Risks and benefits of continued active surveillance versus starting androgen deprivation therapy were discussed.  At this time, given his successfully treated oligometastatic disease, we opted to continue active surveillance and reinstitute androgen deprivation if he has a rise in his PSA and any measurable disease.  He is agreeable with this plan.      2.  Follow-up: In 3 months for follow-up visit.        I discussed the assessment and treatment plan with the patient. The patient was provided an opportunity to ask questions and all were answered. The patient agreed with the plan and demonstrated an understanding of the instructions.   The patient was advised to call back or  seek an in-person evaluation if the symptoms worsen or if the condition fails to improve as anticipated.  I provided 20 minutes of non face-to-face telephone visit time during this encounter.  The time was dedicated to reviewing laboratory data, his disease status update, treatment choices and future plan of care discussion.  Zola Button, MD 05/19/2021 8:40 AM

## 2021-05-21 DIAGNOSIS — G4730 Sleep apnea, unspecified: Secondary | ICD-10-CM | POA: Diagnosis not present

## 2021-05-21 DIAGNOSIS — R0683 Snoring: Secondary | ICD-10-CM | POA: Diagnosis not present

## 2021-05-25 DIAGNOSIS — H47011 Ischemic optic neuropathy, right eye: Secondary | ICD-10-CM | POA: Diagnosis not present

## 2021-05-25 DIAGNOSIS — H4710 Unspecified papilledema: Secondary | ICD-10-CM | POA: Diagnosis not present

## 2021-05-25 DIAGNOSIS — Z7982 Long term (current) use of aspirin: Secondary | ICD-10-CM | POA: Diagnosis not present

## 2021-05-25 DIAGNOSIS — Z79899 Other long term (current) drug therapy: Secondary | ICD-10-CM | POA: Diagnosis not present

## 2021-05-25 DIAGNOSIS — H11823 Conjunctivochalasis, bilateral: Secondary | ICD-10-CM | POA: Diagnosis not present

## 2021-05-25 DIAGNOSIS — H25813 Combined forms of age-related cataract, bilateral: Secondary | ICD-10-CM | POA: Diagnosis not present

## 2021-05-29 DIAGNOSIS — D2262 Melanocytic nevi of left upper limb, including shoulder: Secondary | ICD-10-CM | POA: Diagnosis not present

## 2021-05-29 DIAGNOSIS — D2261 Melanocytic nevi of right upper limb, including shoulder: Secondary | ICD-10-CM | POA: Diagnosis not present

## 2021-05-29 DIAGNOSIS — D2239 Melanocytic nevi of other parts of face: Secondary | ICD-10-CM | POA: Diagnosis not present

## 2021-05-29 DIAGNOSIS — L820 Inflamed seborrheic keratosis: Secondary | ICD-10-CM | POA: Diagnosis not present

## 2021-05-31 ENCOUNTER — Encounter: Payer: Self-pay | Admitting: *Deleted

## 2021-06-02 ENCOUNTER — Telehealth: Payer: Self-pay | Admitting: Pulmonary Disease

## 2021-06-02 NOTE — Telephone Encounter (Signed)
Inform patient  Overnight oximetry reviewed  Study shows no significant oxygen desaturations with CPAP in place -No changes to current treatment

## 2021-06-02 NOTE — Telephone Encounter (Signed)
Patient is aware of results and voiced his understanding.  Nothing further needed at this time.

## 2021-06-12 DIAGNOSIS — G4733 Obstructive sleep apnea (adult) (pediatric): Secondary | ICD-10-CM | POA: Diagnosis not present

## 2021-06-17 DIAGNOSIS — R5383 Other fatigue: Secondary | ICD-10-CM | POA: Diagnosis not present

## 2021-06-17 DIAGNOSIS — G4733 Obstructive sleep apnea (adult) (pediatric): Secondary | ICD-10-CM | POA: Diagnosis not present

## 2021-06-17 DIAGNOSIS — F419 Anxiety disorder, unspecified: Secondary | ICD-10-CM | POA: Diagnosis not present

## 2021-06-27 ENCOUNTER — Telehealth: Payer: Self-pay | Admitting: Family Medicine

## 2021-06-27 NOTE — Telephone Encounter (Signed)
Left message for patient to schedule Annual Wellness Visit.  Please schedule with Nurse Health Advisor Julie Greer, RN at [facility]   

## 2021-06-29 DIAGNOSIS — G4733 Obstructive sleep apnea (adult) (pediatric): Secondary | ICD-10-CM | POA: Diagnosis not present

## 2021-07-06 ENCOUNTER — Telehealth: Payer: Self-pay | Admitting: Family Medicine

## 2021-07-06 NOTE — Chronic Care Management (AMB) (Signed)
  Chronic Care Management   Note  07/06/2021 Name: Samuel Richmond MRN: [id number] DOB: [birth year]  Samuel Richmond is a 65 y.o. year old male who is a primary care patient of Birdie Riddle, Aundra Millet, MD. I reached out to Tomasita Morrow by phone today in response to a referral sent by Samuel Richmond's PCP, Midge Minium, MD.   [name] was given information about Chronic Care Management services today including:  CCM service includes personalized support from designated clinical staff supervised by his physician, including individualized plan of care and coordination with other care providers 24/7 contact phone numbers for assistance for urgent and routine care needs. Service will only be billed when office clinical staff spend 20 minutes or more in a month to coordinate care. Only one practitioner may furnish and bill the service in a calendar month. The patient may stop CCM services at any time (effective at the end of the month) by phone call to the office staff.   Patient agreed to services and verbal consent obtained.   Follow up plan:   Tatjana Secretary/administrator

## 2021-07-10 ENCOUNTER — Ambulatory Visit (INDEPENDENT_AMBULATORY_CARE_PROVIDER_SITE_OTHER): Payer: PPO | Admitting: *Deleted

## 2021-07-10 ENCOUNTER — Telehealth: Payer: Self-pay

## 2021-07-10 DIAGNOSIS — Z0000 Encounter for general adult medical examination without abnormal findings: Secondary | ICD-10-CM

## 2021-07-10 NOTE — Patient Instructions (Signed)
Samuel Richmond , Thank you for taking time to come for your Medicare Wellness Visit. I appreciate your ongoing commitment to your health goals. Please review the following plan we discussed and let me know if I can assist you in the future.   Screening recommendations/referrals: Colonoscopy: up to date Recommended yearly ophthalmology/optometry visit for glaucoma screening and checkup Recommended yearly dental visit for hygiene and checkup  Vaccinations: Influenza vaccine: up to date Pneumococcal vaccine: up to date Tdap vaccine: up to date Shingles vaccine: up to date    Advanced directives: copy requested  Conditions/risks identified: na  Next appointment: 12-13-2021 @ 10:30 Dr. Birdie Riddle  Preventive Care 19 Years and Older, Male Preventive care refers to lifestyle choices and visits with your health care provider that can promote health and wellness. What does preventive care include? A yearly physical exam. This is also called an annual well check. Dental exams once or twice a year. Routine eye exams. Ask your health care provider how often you should have your eyes checked. Personal lifestyle choices, including: Daily care of your teeth and gums. Regular physical activity. Eating a healthy diet. Avoiding tobacco and drug use. Limiting alcohol use. Practicing safe sex. Taking low doses of aspirin every day. Taking vitamin and mineral supplements as recommended by your health care provider. What happens during an annual well check? The services and screenings done by your health care provider during your annual well check will depend on your age, overall health, lifestyle risk factors, and family history of disease. Counseling  Your health care provider may ask you questions about your: Alcohol use. Tobacco use. Drug use. Emotional well-being. Home and relationship well-being. Sexual activity. Eating habits. History of falls. Memory and ability to understand  (cognition). Work and work Statistician. Screening  You may have the following tests or measurements: Height, weight, and BMI. Blood pressure. Lipid and cholesterol levels. These may be checked every 5 years, or more frequently if you are over 47 years old. Skin check. Lung cancer screening. You may have this screening every year starting at age 29 if you have a 30-pack-year history of smoking and currently smoke or have quit within the past 15 years. Fecal occult blood test (FOBT) of the stool. You may have this test every year starting at age 61. Flexible sigmoidoscopy or colonoscopy. You may have a sigmoidoscopy every 5 years or a colonoscopy every 10 years starting at age 27. Prostate cancer screening. Recommendations will vary depending on your family history and other risks. Hepatitis C blood test. Hepatitis B blood test. Sexually transmitted disease (STD) testing. Diabetes screening. This is done by checking your blood sugar (glucose) after you have not eaten for a while (fasting). You may have this done every 1-3 years. Abdominal aortic aneurysm (AAA) screening. You may need this if you are a current or former smoker. Osteoporosis. You may be screened starting at age 74 if you are at high risk. Talk with your health care provider about your test results, treatment options, and if necessary, the need for more tests. Vaccines  Your health care provider may recommend certain vaccines, such as: Influenza vaccine. This is recommended every year. Tetanus, diphtheria, and acellular pertussis (Tdap, Td) vaccine. You may need a Td booster every 10 years. Zoster vaccine. You may need this after age 41. Pneumococcal 13-valent conjugate (PCV13) vaccine. One dose is recommended after age 64. Pneumococcal polysaccharide (PPSV23) vaccine. One dose is recommended after age 63. Talk to your health care provider about which screenings  and vaccines you need and how often you need them. This  information is not intended to replace advice given to you by your health care provider. Make sure you discuss any questions you have with your health care provider. Document Released: 12/16/2015 Document Revised: 08/08/2016 Document Reviewed: 09/20/2015 Elsevier Interactive Patient Education  2017 [place] Prevention in the Home Falls can cause injuries. They can happen to people of all ages. There are many things you can do to make your home safe and to help prevent falls. What can I do on the outside of my home? Regularly fix the edges of walkways and driveways and fix any cracks. Remove anything that might make you trip as you walk through a door, such as a raised step or threshold. Trim any bushes or trees on the path to your home. Use bright outdoor lighting. Clear any walking paths of anything that might make someone trip, such as rocks or tools. Regularly check to see if handrails are loose or broken. Make sure that both sides of any steps have handrails. Any raised decks and porches should have guardrails on the edges. Have any leaves, snow, or ice cleared regularly. Use sand or salt on walking paths during winter. Clean up any spills in your garage right away. This includes oil or grease spills. What can I do in the bathroom? Use night lights. Install grab bars by the toilet and in the tub and shower. Do not use towel bars as grab bars. Use non-skid mats or decals in the tub or shower. If you need to sit down in the shower, use a plastic, non-slip stool. Keep the floor dry. Clean up any water that spills on the floor as soon as it happens. Remove soap buildup in the tub or shower regularly. Attach bath mats securely with double-sided non-slip rug tape. Do not have throw rugs and other things on the floor that can make you trip. What can I do in the bedroom? Use night lights. Make sure that you have a light by your bed that is easy to reach. Do not use any sheets or  blankets that are too big for your bed. They should not hang down onto the floor. Have a firm chair that has side arms. You can use this for support while you get dressed. Do not have throw rugs and other things on the floor that can make you trip. What can I do in the kitchen? Clean up any spills right away. Avoid walking on wet floors. Keep items that you use a lot in easy-to-reach places. If you need to reach something above you, use a strong step stool that has a grab bar. Keep electrical cords out of the way. Do not use floor polish or wax that makes floors slippery. If you must use wax, use non-skid floor wax. Do not have throw rugs and other things on the floor that can make you trip. What can I do with my stairs? Do not leave any items on the stairs. Make sure that there are handrails on both sides of the stairs and use them. Fix handrails that are broken or loose. Make sure that handrails are as long as the stairways. Check any carpeting to make sure that it is firmly attached to the stairs. Fix any carpet that is loose or worn. Avoid having throw rugs at the top or bottom of the stairs. If you do have throw rugs, attach them to the floor with carpet tape.  Make sure that you have a light switch at the top of the stairs and the bottom of the stairs. If you do not have them, ask someone to add them for you. What else can I do to help prevent falls? Wear shoes that: Do not have high heels. Have rubber bottoms. Are comfortable and fit you well. Are closed at the toe. Do not wear sandals. If you use a stepladder: Make sure that it is fully opened. Do not climb a closed stepladder. Make sure that both sides of the stepladder are locked into place. Ask someone to hold it for you, if possible. Clearly mark and make sure that you can see: Any grab bars or handrails. First and last steps. Where the edge of each step is. Use tools that help you move around (mobility aids) if they are  needed. These include: Canes. Walkers. Scooters. Crutches. Turn on the lights when you go into a dark area. Replace any light bulbs as soon as they burn out. Set up your furniture so you have a clear path. Avoid moving your furniture around. If any of your floors are uneven, fix them. If there are any pets around you, be aware of where they are. Review your medicines with your doctor. Some medicines can make you feel dizzy. This can increase your chance of falling. Ask your doctor what other things that you can do to help prevent falls. This information is not intended to replace advice given to you by your health care provider. Make sure you discuss any questions you have with your health care provider. Document Released: 09/15/2009 Document Revised: 04/26/2016 Document Reviewed: 12/24/2014 Elsevier Interactive Patient Education  2017 [employer].

## 2021-07-10 NOTE — Progress Notes (Signed)
Chronic Care Management Pharmacy Assistant   Name: Samuel Richmond  MRN: [id number] DOB: [birth year]   Samuel Richmond is an 65 y.o. year old male who presents for his initial CCM visit with the clinical pharmacist.   Reason for Encounter: Initial CCM Visit    Recent office visits:  05/18/21 Annye Asa, MD (PCP) - Family Medicine - Annual Physical - Labs were ordered. Follow instructions were given for weaning off depression medication regimen. DECREASE the Lexapro to $RemoveBe'5mg'oTTvzJTsB$  daily- 1 of the new prescription.  If doing well on 5 mg after 1 month, decrease to 1/2 tab (2.$RemoveBe'5mg'zpxEwvUAl$  daily) DECREASE the Wellbutrin to $RemoveBefor'75mg'nNImhHdEnHFB$  twice daily.  If doing well on that after 4-6 weeks, decrease to $RemoveBef'75mg'SKNrthXMrG$  once daily in the morning. Follow up in 6 months.   Recent consult visits:  05/25/21 Drema Halon - Ophalmology - Optic disc edema - Taper Prednisone 10 mg for 1 week, then 10 mg every other day for 1 week, then stop Brim BID OD. Ordered MR brain and orbits WWO-> scheduled for 05/26/21. Follow up in 6-8 weeks.  05/19/21 Zola Button, MD - Oncology - Prostate Cancer - No medication changes. Follow up in 3 months.   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   04/27/21 Jolyn Nap, MD - Ophthalmology - Ischemic optic neuropathy of right eye - No notes available.   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 Follow up in 10-14 days.  04/10/21  Westley Foots, MD - Ophthalmology - Ischemic optic neuropathy of right eye - No notes available.   03/24/21 Hermelinda Medicus, MD - Rheumatology - Unspecified Papilledema - No notes available   03/23/21 Kerrie Buffalo, MD - Ophthalmology - Optic Disc Edema - No notes available.  03/23/21 Westley Foots, MD -  Opthalmology -  Unspecified Papilledema - No notes available.   02/24/21 Jari Pigg, MD - Dermatology - Other Seborrheic keratosis - No notes available.  02/16/21 Zola Button, MD - Oncology - Prostate Cancer - No medication changes. Follow up in 3 months.   02/02/21 Isabel Caprice, MD - Opthalmology - Optic disc edema - Taper prednisone 40 mg for 3-4 days then 30 mg for 1 week then 20 mg for 1 week then 10 mg for 1 week then stop. Finish bottle of brimonidine 2x/day. Follow up in 3 months.   01/16/21 Maris Berger, MD - Opthalmology - Optic disc edema - Start brimonidine 1 drop morning and night. Start prednisone 60 mg (3 tablets) daily for 5 days. Then go to Prednisone 40 mg (2 tablets) daily until follow up. Follow up in 2 weeks.   01/16/21 Westley Foots, MD - Opthalmology - Unspecified Papilledema - No notes available.   Hospital visits:  None in previous 6 months  Medications: Outpatient Encounter Medications as of 07/10/2021  Medication Sig   albuterol (VENTOLIN HFA) 108 (90 Base) MCG/ACT inhaler Inhale 2 puffs into the lungs every 4 (four) hours as needed for wheezing or shortness of breath.   ALPRAZolam (XANAX) 0.5 MG tablet Take 1 tablet (0.5 mg total) by mouth 2 (two) times daily as needed for anxiety.   brimonidine (ALPHAGAN) 0.2 % ophthalmic solution Place 1 drop into the right eye in the morning and at bedtime.  buPROPion (WELLBUTRIN) 75 MG tablet Take 1 tablet (75 mg total) by mouth 2 (two) times daily.   escitalopram (LEXAPRO) 5 MG tablet Take 1 tablet (5 mg total) by mouth daily.   fluticasone (FLONASE) 50 MCG/ACT nasal spray Place 2 sprays into both nostrils daily.   Fluticasone-Umeclidin-Vilant (TRELEGY ELLIPTA) 100-62.5-25 MCG/INH AEPB Inhale 1 puff into the lungs daily.   meloxicam (MOBIC) 7.5 MG tablet Take 7.5 mg by mouth daily.   methylPREDNISolone (MEDROL DOSEPAK) 4 MG TBPK tablet Take by mouth.   omeprazole (PRILOSEC) 40 MG capsule Take 40 mg by mouth daily.     PREVNAR 20 0.5 ML SUSY    psyllium (METAMUCIL) 58.6 % powder Take 1 packet by mouth 2 (two) times daily.   QUICKVUE AT-HOME COVID-19 TEST KIT See admin instructions.   simvastatin (ZOCOR) 20 MG tablet TAKE 1 TABLET BY MOUTH EVERYDAY AT BEDTIME   No facility-administered encounter medications on file as of 07/10/2021.    Have you seen any other providers since your last visit?  Patient reported he has seen Opthalmology closely due to a recent issue with his eye and has lost significant  eyesight in his right eye.  Any changes in your medications or health?  Patient denied any recent changes in medications or health other than his eye issue.   Any side effects from any medications?  Patient denied any side effects from any of his current medications.   Do you have an symptoms or problems not managed by your medications?  Patient reports he has not been able to wean off his depression medications as he had hoped and wishes to go back to his previous regimen and will maybe try again once he retires later this year.   Any concerns about your health right now?  Patient does have some concerns about his decline in his eye sight.   Has your provider asked that you check blood pressure, blood sugar, or follow special diet at home?  Patient stated he has not been asked to do any remote monitoring at home.   Do you get any type of exercise on a regular basis?  Patient reported he remains active on a daily basis as he is able with his decline in eye sight recently.   Can you think of a goal you would like to reach for your health?  Patient would like to work on weight loss and wean off his depression medication in the future.   Do you have any problems getting your medications?  Patient denied having any trouble getting his medications.   Is there anything that you would like to discuss during the appointment?  Patient would like to discuss the benefit of taking a baby aspirin daily for his eye  sight as there is not much clinical data on this and he wonders if it is beneficial to continue.   Appointment for initial phone CPP visit for 07/12/21 @ 11:00 am. Patient confirmed date and time of upcoming appointment.   Please bring medications and supplements to appointment     Star Rating Drugs: simvastatin (ZOCOR) 20 MG tablet - last filled 12/28/20 90 days ???  Jobe Gibbon, [facility] Pharmacist Assistant  [phone]  Time Spent: 66  minutes

## 2021-07-10 NOTE — Progress Notes (Signed)
Subjective:   Samuel Richmond is a 65 y.o. male who presents for Medicare Annual/Subsequent preventive examination.  I connected with  TRENDEN HAZELRIGG on 07/10/21 by a telephone telemedicine application and verified that I am speaking with the correct person using two identifiers.   I discussed the limitations of evaluation and management by telemedicine. The patient expressed understanding and agreed to proceed.   Review of Systems    NA       Objective:    Today's Vitals   There is no height or weight on file to calculate BMI.  Advanced Directives 07/10/2021 01/30/2021 12/16/2020 04/01/2018 06/15/2016 06/04/2016 09/30/2015  Does Patient Have a Medical Advance Directive? Yes Yes Yes Yes Yes Yes Yes  Type of Paramedic of [place];Living will [place];Living will [place];Living will Living will - Living will;Healthcare Power of Attorney -  Does patient want to make changes to medical advance directive? - - No - Patient declined - - - -  Copy of [place] in Chart? No - copy requested - No - copy requested - No - copy requested - -  Pre-existing out of facility DNR order (yellow form or pink MOST form) - - - - - - -    Current Medications (verified) Outpatient Encounter Medications as of 07/10/2021  Medication Sig   albuterol (VENTOLIN HFA) 108 (90 Base) MCG/ACT inhaler Inhale 2 puffs into the lungs every 4 (four) hours as needed for wheezing or shortness of breath.   ALPRAZolam (XANAX) 0.5 MG tablet Take 1 tablet (0.5 mg total) by mouth 2 (two) times daily as needed for anxiety.   buPROPion (WELLBUTRIN) 75 MG tablet Take 1 tablet (75 mg total) by mouth 2 (two) times daily.   escitalopram (LEXAPRO) 5 MG tablet Take 1 tablet (5 mg total) by mouth daily.   fluticasone (FLONASE) 50 MCG/ACT nasal spray Place 2 sprays into both nostrils daily.   Fluticasone-Umeclidin-Vilant (TRELEGY ELLIPTA) 100-62.5-25  MCG/INH AEPB Inhale 1 puff into the lungs daily.   psyllium (METAMUCIL) 58.6 % powder Take 1 packet by mouth 2 (two) times daily.   simvastatin (ZOCOR) 20 MG tablet TAKE 1 TABLET BY MOUTH EVERYDAY AT BEDTIME   brimonidine (ALPHAGAN) 0.2 % ophthalmic solution Place 1 drop into the right eye in the morning and at bedtime. (Patient not taking: Reported on 07/10/2021)   meloxicam (MOBIC) 7.5 MG tablet Take 7.5 mg by mouth daily. (Patient not taking: Reported on 07/10/2021)   methylPREDNISolone (MEDROL DOSEPAK) 4 MG TBPK tablet Take by mouth. (Patient not taking: Reported on 07/10/2021)   omeprazole (PRILOSEC) 40 MG capsule Take 40 mg by mouth daily.    PREVNAR 20 0.5 ML SUSY  (Patient not taking: Reported on 07/10/2021)   QUICKVUE AT-HOME COVID-19 TEST KIT See admin instructions. (Patient not taking: Reported on 07/10/2021)   No facility-administered encounter medications on file as of 07/10/2021.    Allergies (verified) Levaquin [levofloxacin]   History: Past Medical History:  Diagnosis Date   Allergy    Anxiety    Arthritis    Cancer ([place])    prostate   COPD (chronic obstructive pulmonary disease) ([place])    mild; denies SOB with ADLs; no current med.   Dental crowns present    Depression    Dyslipidemia    no current med.   GERD (gastroesophageal reflux disease)    no current med.   History of prostate cancer    OSA on  CPAP    Prostate cancer ([place])    Pulmonary nodules    "stable", per CT chest 06/08/2015   S/P radiation therapy 12/15/2014 - 02/01/2015                                                                                     Sleep apnea    CPAP   Stenosing tenosynovitis of finger 09/2015   bilateral index finger   Past Surgical History:  Procedure Laterality Date   COLONOSCOPY  05-08-06   normal   HYDROCELE EXCISION Right 03/30/2004   INGUINAL HERNIA REPAIR Left    INGUINAL HERNIA REPAIR Right 03/30/2004   KNEE ARTHROSCOPY Bilateral    multiple    LYMPHADENECTOMY Bilateral  03/26/2013   Procedure: LYMPHADENECTOMY;  Surgeon: Dutch Gray, MD;  Location: [facility];  Service: Urology;  Laterality: Bilateral;   NASAL CONCHA BULLOSA RESECTION Right 08/01/2000   NASAL TURBINATE REDUCTION  08/01/2000   ROBOT ASSISTED LAPAROSCOPIC RADICAL PROSTATECTOMY Bilateral 03/26/2013   Procedure: ROBOTIC ASSISTED LAPAROSCOPIC RADICAL PROSTATECTOMY LEVEL 2;  Surgeon: Dutch Gray, MD;  Location: [facility];  Service: Urology;  Laterality: Bilateral;  3 HRS    SEPTOPLASTY  08/01/2000   TOTAL KNEE ARTHROPLASTY Left 07/15/2013   Dr Percell Miller   TOTAL KNEE ARTHROPLASTY Left 07/15/2013   Procedure: TOTAL KNEE ARTHROPLASTY;  Surgeon: Ninetta Lights, MD;  Location: [facility];  Service: Orthopedics;  Laterality: Left;   TRIGGER FINGER RELEASE  09/04/2012   Procedure: MINOR RELEASE TRIGGER FINGER/A-1 PULLEY;  Surgeon: Cammie Sickle., MD;  Location: [facility];  Service: Orthopedics;  Laterality: Left;  left long and cyst excision left long   TRIGGER FINGER RELEASE Bilateral 12/09/2014   Procedure: RELEASE A-1 PULLEY RIGHT MIDDLE FINGER,RELEASE A-1 PULLEY LEFT RING FINGER;  Surgeon: Daryll Brod, MD;  Location: [facility];  Service: Orthopedics;  Laterality: Bilateral;   TRIGGER FINGER RELEASE Bilateral 09/30/2015   Procedure: RELEASE TRIGGER FINGER/A-1 PULLEY BILATERAL INDEX FINGERS;  Surgeon: Daryll Brod, MD;  Location: [facility];  Service: Orthopedics;  Laterality: Bilateral;   Family History  Problem Relation Age of Onset   Prostate cancer Father    Rheumatic fever Paternal Grandmother    Hyperlipidemia Sister    Colon cancer Neg Hx    Colon polyps Neg Hx    Esophageal cancer Neg Hx    Rectal cancer Neg Hx    Stomach cancer Neg Hx    Social History   Socioeconomic History   Marital status: Married    Spouse name: Not on file   Number of children: 1   Years of education: Not on file   Highest education level: Not on file  Occupational History     Employer: [employer]  Tobacco Use   Smoking status: Former    Packs/day: 1.00    Years: 20.00    Pack years: 20.00    Types: Cigarettes    Quit date: 12/03/1998    Years since quitting: 22.6   Smokeless tobacco: Never  Vaping Use   Vaping Use: Never used  Substance and Sexual Activity   Alcohol use: No    Alcohol/week: 0.0  standard drinks   Drug use: No   Sexual activity: Not Currently  Other Topics Concern   Not on file  Social History Narrative   Pt lives at home with wife 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.Pt has had some college   Social Determinants of Health   Financial Resource Strain: Low Risk    Difficulty of Paying Living Expenses: Not hard at all  Food Insecurity: No Food Insecurity   Worried About Charity fundraiser in the Last Year: Never true   Arboriculturist in the Last Year: Never true  Transportation Needs: No Transportation Needs   Lack of Transportation (Medical): No   Lack of Transportation (Non-Medical): No  Physical Activity: Sufficiently Active   Days of Exercise per Week: 5 days   Minutes of Exercise per Session: 50 min  Stress: No Stress Concern Present   Feeling of Stress : Not at all  Social Connections: Moderately Integrated   Frequency of Communication with Friends and Family: Twice a week   Frequency of Social Gatherings with Friends and Family: More than three times a week   Attends Religious Services: More than 4 times per year   Active Member of [employer] or Organizations: No   Attends Music therapist: Never   Marital Status: Married    Tobacco Counseling Counseling given: Not Answered   Clinical Intake:  Pre-visit preparation completed: Yes  Pain : No/denies pain     Nutritional Risks: None Diabetes: No  How often do you need to have someone help you when you read instructions, pamphlets, or other written materials from your doctor or pharmacy?: 1 - Never  Diabetic?no  Interpreter Needed?:  No  Information entered by :: Leroy Kennedy LPN   Activities of Daily Living In your present state of health, do you have any difficulty performing the following activities: 07/10/2021 05/18/2021  Hearing? N N  Vision? 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 N N  Walking or climbing stairs? N N  Dressing or bathing? N N  Doing errands, shopping? N N  Preparing Food and eating ? N -  Using the Toilet? N -  In the past six months, have you accidently leaked urine? N -  Do you have problems with loss of bowel control? N -  Managing your Medications? N -  Managing your Finances? N -  Housekeeping or managing your Housekeeping? N -  Some recent data might be hidden    Patient Care Team: Midge Minium, MD as PCP - General (Family Medicine) Raynelle Bring, MD as Consulting Physician (Urology) Milus Banister, MD as Attending Physician (Gastroenterology) Tanda Rockers, MD as Consulting Physician (Pulmonary Disease) Kerry Fort., MD as Consulting Physician (Urology) Madelin Rear, [facility] as Pharmacist (Pharmacist)  Indicate any recent Medical Services you may have received from other than [facility] providers in the past year (date may be approximate).     Assessment:   This is a routine wellness examination for [place].  Hearing/Vision screen Hearing Screening - Comments:: No trouble hearing Vision Screening - Comments:: [place]  Up to date   Dietary issues and exercise activities discussed: Current Exercise Habits: Structured exercise class, Time (Minutes): 50, Frequency (Times/Week): 5, Weekly Exercise (Minutes/Week): 250, Intensity: Moderate   Goals Addressed             This Visit's Progress    Patient  Stated       Weight loss        Depression Screen PHQ 2/9 Scores 05/18/2021 07/28/2020 02/10/2020 12/31/2018 12/30/2017 09/26/2017 06/19/2016  PHQ - 2 Score 0 1 0 0 0 0 0  PHQ- 9 Score 0 1 0 0 0 0 -     Fall Risk Fall Risk  05/18/2021 07/28/2020 02/10/2020 12/31/2018 12/30/2017  Falls in the past year? 0 0 0 0 No  Number falls in past yr: 0 0 0 0 -  Injury with Fall? 0 0 0 - -  Risk for fall due to : No Fall Risks - - - -  Follow up - Falls evaluation completed Falls evaluation completed - -    FALL RISK PREVENTION PERTAINING TO THE HOME:  Any stairs in or around the home? No  If so, are there any without handrails? No  Home free of loose throw rugs in walkways, pet beds, electrical cords, etc? Yes  Adequate lighting in your home to reduce risk of falls? Yes   ASSISTIVE DEVICES UTILIZED TO PREVENT FALLS:  Life alert? No  Use of a cane, walker or w/c? No  Grab bars in the bathroom? No  Shower chair or bench in shower? Yes  Elevated toilet seat or a handicapped toilet? No   TIMED UP AND GO:  Was the test performed? No .    Cognitive Function:  Normal cognitive status assessed by direct observation by this Nurse Health Advisor. No abnormalities found.          Immunizations Immunization History  Administered Date(s) Administered   Influenza Split 09/03/2015   Influenza Whole 12/01/2004   Influenza,inj,Quad PF,6+ Mos 08/25/2014, 08/21/2017, 07/24/2018, 07/27/2019, 08/12/2020   PFIZER Comirnaty(Gray Top)Covid-19 Tri-Sucrose Vaccine 04/24/2021   PFIZER(Purple Top)SARS-COV-2 Vaccination 01/07/2020, 01/29/2020, 08/26/2020   PNEUMOCOCCAL CONJUGATE-20 02/11/2021   Pneumococcal Conjugate-13 02/11/2021   Tdap 11/16/2009, 12/31/2018   Zoster Recombinat (Shingrix) 12/31/2018, 05/15/2019   Zoster, Live 09/01/2014    TDAP status: Up to date  Flu Vaccine status: Up to date  Pneumococcal vaccine status: Up to date  Covid-19 vaccine status: Completed vaccines  Qualifies for Shingles Vaccine? No   Zostavax completed Yes   Shingrix Completed?: Yes  Screening Tests Health Maintenance  Topic Date Due   PNA vac Low Risk Adult (1 of 2 - PCV13) 03/17/2019   INFLUENZA VACCINE   07/03/2021   COVID-19 Vaccine (5 - Booster for Pfizer series) 08/25/2021   COLONOSCOPY (Pts 45-25yrs Insurance coverage will need to be confirmed)  06/15/2026   TETANUS/TDAP  12/31/2028   Hepatitis C Screening  Completed   Zoster Vaccines- Shingrix  Completed   HPV VACCINES  Aged Out    Health Maintenance  Health Maintenance Due  Topic Date Due   PNA vac Low Risk Adult (1 of 2 - PCV13) 03/17/2019   INFLUENZA VACCINE  07/03/2021    Colorectal cancer screening: Type of screening: Colonoscopy. Completed  . Repeat every 10 2027 years  Lung Cancer Screening: (Low Dose CT Chest recommended if Age 70-80 years, 30 pack-year currently smoking OR have quit w/in 15years.) does not qualify.   Lung Cancer Screening Referral:    Additional Screening:  Hepatitis C Screening: does not qualify; Completed   Vision Screening: Recommended annual ophthalmology exams for early detection of glaucoma and other disorders of the eye. Is the patient up to date with their annual eye exam?  Yes  Who is the provider or what is the name of the office in which  the patient attends annual eye exams? [facility] If pt is not established with a provider, would they like to be referred to a provider to establish care? No .   Dental Screening: Recommended annual dental exams for proper oral hygiene  Community Resource Referral / Chronic Care Management: CRR required this visit?  No   CCM required this visit?  No      Plan:     I have personally reviewed and noted the following in the patient's chart:   Medical and social history Use of alcohol, tobacco or illicit drugs  Current medications and supplements including opioid prescriptions. Patient is not currently taking opioid prescriptions. Functional ability and status Nutritional status Physical activity Advanced directives List of other physicians Hospitalizations, surgeries, and ER visits in previous 12  months Vitals Screenings to include cognitive, depression, and falls Referrals and appointments  In addition, I have reviewed and discussed with patient certain preventive protocols, quality metrics, and best practice recommendations. A written personalized care plan for preventive services as well as general preventive health recommendations were provided to patient.     Leroy Kennedy, LPN   0/01/1114   Nurse Notes: na

## 2021-07-11 NOTE — Progress Notes (Signed)
Chronic Care Management Pharmacy Note  07/12/2021 Name:  Samuel Richmond MRN:  [id number] DOB:  12-Dec-1955  Recommendations/Changes made from today's visit: -No Rx Changes  Subjective: Samuel Richmond is an 65 y.o. year old male who is a primary patient of Tabori, Aundra Millet, MD.  The CCM team was consulted for assistance with disease management and care coordination needs.    Engaged with patient by telephone for initial visit in response to provider referral for pharmacy case management and/or care coordination services.   Consent to Services:  The patient was given the following information about Chronic Care Management services today, agreed to services, and gave verbal consent: 1. CCM service includes personalized support from designated clinical staff supervised by the primary care provider, including individualized plan of care and coordination with other care providers 2. 24/7 contact phone numbers for assistance for urgent and routine care needs. 3. Service will only be billed when office clinical staff spend 20 minutes or more in a month to coordinate care. 4. Only one practitioner may furnish and bill the service in a calendar month. 5.The patient may stop CCM services at any time (effective at the end of the month) by phone call to the office staff. 6. The patient will be responsible for cost sharing (co-pay) of up to 20% of the service fee (after annual deductible is met). Patient agreed to services and consent obtained.  Patient Care Team: Midge Minium, MD as PCP - General (Family Medicine) Raynelle Bring, MD as Consulting Physician (Urology) Milus Banister, MD as Attending Physician (Gastroenterology) Tanda Rockers, MD as Consulting Physician (Pulmonary Disease) Kerry Fort., MD as Consulting Physician (Urology) Madelin Rear, [facility] as Pharmacist (Pharmacist) Recent office visits:  05/18/21 Annye Asa, MD (PCP) - Family Medicine - Annual Physical -  Labs were ordered. Follow instructions were given for weaning off depression medication regimen. DECREASE the Lexapro to 66m daily- 1 of the new prescription.  If doing well on 5 mg after 1 month, decrease to 1/2 tab (2.535mdaily) DECREASE the Wellbutrin to 7580mwice daily.  If doing well on that after 4-6 weeks, decrease to 53m38mce daily in the morning. Follow up in 6 months.    Objective:  Lab Results  Component Value Date   CREATININE 0.76 05/17/2021   CREATININE 0.80 02/09/2021   CREATININE 0.98 11/15/2020  No results found for: HGBA1C Last diabetic Eye exam: No results found for: HMDIABEYEEXA  Last diabetic Foot exam: No results found for: HMDIABFOOTEX      Component Value Date/Time   CHOL 152 05/18/2021 0855   TRIG 309.0 (H) 05/18/2021 0855   HDL 40.30 05/18/2021 0855   CHOLHDL 4 05/18/2021 0855   VLDL 61.8 (H) 05/18/2021 0855   LDLCALC 74 02/10/2020 1407   LDLDIRECT 75.0 05/18/2021 0855   Hepatic Function Latest Ref Rng & Units 05/17/2021 02/09/2021 11/15/2020  Total Protein 6.5 - 8.1 g/dL 6.5 6.2(L) 6.9  Albumin 3.5 - 5.0 g/dL 3.8 3.8 4.1  AST 15 - 41 U/L 16 13(L) 20  ALT 0 - 44 U/L _0 Alk Phosphatase 38 - 126 U/L 64 61 73  Total Bilirubin 0.3 - 1.2 mg/dL 0.7 1.2 1.3(H)  Bilirubin, Direct 0.0 - 0.3 mg/dL - - -    Lab Results  Component Value Date/Time   TSH 2.89 05/18/2021 08:55 AM   TSH 2.55 02/10/2020 02:07 PM    CBC Latest Ref Rng & Units 05/17/2021 02/09/2021 11/15/2020  WBC 4.0 - 10.5 K/uL 5.0 9.0 5.0  Hemoglobin 13.0 - 17.0 g/dL 13.1 13.6 14.3  Hematocrit 39.0 - 52.0 % 38.7(L) 39.6 42.3  Platelets 150 - 400 K/uL 232 186 240    No results found for: VD25OH  Clinical ASCVD:  The 10-year ASCVD risk score Mikey Bussing DC Jr., et al., 2013) is: 9.7%   Values used to calculate the score:     Age: 27 years     Sex: Male     Is Non-Hispanic African American: No     Diabetic: No     Tobacco smoker: No     Systolic Blood Pressure: 599 mmHg     Is BP  treated: No     HDL Cholesterol: 40.3 mg/dL     Total Cholesterol: 152 mg/dL    Social History   Tobacco Use  Smoking Status Former   Packs/day: 1.00   Years: 20.00   Pack years: 20.00   Types: Cigarettes   Quit date: 12/03/1998   Years since quitting: 22.6  Smokeless Tobacco Never   BP Readings from Last 3 Encounters:  05/18/21 120/78  02/16/21 111/74  01/05/21 124/72   Pulse Readings from Last 3 Encounters:  05/18/21 68  02/16/21 75  01/05/21 69   Wt Readings from Last 3 Encounters:  05/18/21 199 lb 6.4 oz (90.4 kg)  02/16/21 189 lb 1.6 oz (85.8 kg)  01/05/21 190 lb 12.8 oz (86.5 kg)    Assessment: Review of patient past medical history, allergies, medications, health status, including review of consultants reports, laboratory and other test data, was performed as part of comprehensive evaluation and provision of chronic care management services.   SDOH:  (Social Determinants of Health) assessments and interventions performed:    CCM Care Plan  Allergies  Allergen Reactions   Levaquin [Levofloxacin] Other (See Comments)    LEG CRAMPS, CHILLS    Medications Reviewed Today     Reviewed by Madelin Rear, [facility] (Pharmacist) on 07/12/21 at 1217  Med List Status: <None>   Medication Order Taking? Sig Documenting Provider Last Dose Status Informant  albuterol (VENTOLIN HFA) 108 (90 Base) MCG/ACT inhaler [id number]  Inhale 2 puffs into the lungs every 4 (four) hours as needed for wheezing or shortness of breath. Laurin Coder, MD  Active   ALPRAZolam Duanne Moron) 0.5 MG tablet [id number]  Take 1 tablet (0.5 mg total) by mouth 2 (two) times daily as needed for anxiety. Laurin Coder, MD  Active     Discontinued 07/12/21 1149 (Dose change)     Discontinued 07/12/21 1149 (Dose change)   fluticasone (FLONASE) 50 MCG/ACT nasal spray [id number]  Place 2 sprays into both nostrils daily. Midge Minium, MD  Active   Fluticasone-Umeclidin-Vilant (TRELEGY ELLIPTA) 100-62.5-25  MCG/INH AEPB [id number]  Inhale 1 puff into the lungs daily. Laurin Coder, MD  Active   meloxicam (MOBIC) 7.5 MG tablet [id number]  Take 7.5 mg by mouth daily.  Patient not taking: Reported on 07/10/2021   [provider]  Active   omeprazole (PRILOSEC) 40 MG capsule [id number]  Take 40 mg by mouth daily.  [provider]  Active   psyllium (METAMUCIL) 58.6 % powder [id number]  Take 1 packet by mouth 2 (two) times daily. 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 ([place]) 12/16/2020   Hyperlipidemia 12/31/2018   Overweight (BMI 25.0-29.9) 12/31/2018   Emphysema, unspecified ([place]) 09/26/2017   DOE (dyspnea on exertion) 03/15/2016   Coronary artery calcification seen on CAT scan 06/23/2015   Physical exam 06/02/2015   Multiple pulmonary nodules determined by computed tomography of lung 06/02/2015   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   Prostate cancer ([place])    Asthma    BENIGN NEOPLASM SKIN LOWER LIMB INCLUDING HIP 10/09/2007   Depression with anxiety 10/08/2007   GERD 10/08/2007    Immunization History  Administered Date(s) Administered   Influenza Split 09/03/2015   Influenza Whole 12/01/2004   Influenza,inj,Quad PF,6+ Mos 08/25/2014, 08/21/2017, 07/24/2018, 07/27/2019, 08/12/2020   PFIZER Comirnaty(Gray Top)Covid-19 Tri-Sucrose Vaccine 04/24/2021   PFIZER(Purple Top)SARS-COV-2 Vaccination 01/07/2020, 01/29/2020, 08/26/2020   PNEUMOCOCCAL CONJUGATE-20 02/11/2021   Pneumococcal Conjugate-13 02/11/2021   Tdap 11/16/2009,  12/31/2018   Zoster Recombinat (Shingrix) 12/31/2018, 05/15/2019   Zoster, Live 09/01/2014   Conditions to be addressed/monitored: CAD, HLD, COPD, Anxiety, and Depression  Care Plan : [place]  Updates made by Madelin Rear, [facility] since 07/12/2021 12:00 AM     Problem: CAD, HLD, COPD, Anxiety, and Depression   Priority: High     Long-Range Goal: Disease Management   Start Date: 07/12/2021  Expected End Date: 07/12/2022  This Visit's Progress: On track  Priority: High  Note:   Current Barriers:  Will work to optimize lifestyle choices with healthy diet and increased exercise.  Pharmacist Clinical Goal(s):  Patient will contact provider office for questions/concerns as evidenced notation of same in electronic health record through collaboration with PharmD and provider.   Interventions: 1:1 collaboration with Midge Minium, MD regarding development and update of comprehensive plan of care as evidenced by provider attestation and co-signature Inter-disciplinary care team collaboration (see longitudinal plan of care) Comprehensive medication review performed; medication list updated in electronic medical record  Hyperlipidemia: (LDL goal < 100) -Controlled -Some recent elevations in TGs - has implemented dietary/exercise changes, no longer using steroids -Current treatment: Simvastatin 20 mg once daily -Medications previously tried: n/a  -Current dietary patterns: lower intake in carbs and fatty foods. -Current exercise habits: Routinely walking, expresses interest in using silver sneakers -Reviewed med hx, denies any missed dosing and might have had previous fills on hand -Educated on Cholesterol goals;  Importance of limiting foods high in cholesterol; -Recommended to continue current medication  Mild COPD and Asthma (Goal: control symptoms and prevent exacerbations) -Controlled -Current treatment  Ventolin rescue inhaler as needed Trelegy as  needed -Medications previously tried: Symbicort, Advair  -Gold Grade: Gold 1 (FEV1>80%) -Current COPD Classification:  A (low sx, <2 exacerbations/yr) -MMRC/CAT score: 0/3 -Exacerbations requiring treatment in last 6 months: 0 -Patient denies consistent use of maintenance inhaler -Frequency of rescue inhaler use: rare -Counseled on Proper inhaler technique; Benefits of consistent maintenance inhaler use -Recommended to continue current medication  Depression/Anxiety (Goal: minimize symptoms maintain phq9/GAD7 9/4 or less over next 6 months) -Controlled -Previous unsuccessful trial tapering off of bupropion XL 150 mg ->75 mg IR BID -> 75 mg daily and  escitalopram 20 mg -> 10 mg -> 5 mg - 2.5 mg. Symptoms worsened and patient started back  on original dosing outlined below. Feels depression and anxiety are in a good place now and denies any symptoms -Current treatment: Bupropion XL 150 mg once daily Escitalopram 20 mg once daily Alprazolam 0.5 mg twice daily as needed -Medications previously tried/failed: n/a -PHQ9: 0 -GAD7: 0 -Educated on Benefits of medication for symptom control -Recommended to continue current medication Orders adjusted back to previous dosing - confirmed that patient had been maintained on these doses past several weeks.   Patient Goals/Self-Care Activities Patient will:  - take medications as prescribed target a minimum of 150 minutes of moderate intensity exercise weekly engage in dietary modifications by reducing carb rich and fatty foods    Medication Assistance: None required.  Patient affirms current coverage meets needs.  Patient's preferred pharmacy is:  [id number] [place] [address]Phone: [phone]: [phone] [id number] [place] [place]:  [phone]: [phone] Up:  Patient agrees to Care Plan and Follow-up.  Plan:  CPA adherence/general 3 months, CPP 6 month telephone f/u  Future Appointments  Date Time Provider [place]/16/2022 11:00 AM [facility] None  08/25/2021 10:00 AM Shadad, FiMathis DadMD [facility] None   JaMadelin RearPharmD, CPP Clinical Pharmacist Practitioner  [facility]  [phone]

## 2021-07-12 ENCOUNTER — Telehealth: Payer: Self-pay

## 2021-07-12 ENCOUNTER — Ambulatory Visit (INDEPENDENT_AMBULATORY_CARE_PROVIDER_SITE_OTHER): Payer: PPO

## 2021-07-12 DIAGNOSIS — E785 Hyperlipidemia, unspecified: Secondary | ICD-10-CM

## 2021-07-12 DIAGNOSIS — J439 Emphysema, unspecified: Secondary | ICD-10-CM | POA: Diagnosis not present

## 2021-07-12 DIAGNOSIS — F418 Other specified anxiety disorders: Secondary | ICD-10-CM | POA: Diagnosis not present

## 2021-07-12 DIAGNOSIS — F5105 Insomnia due to other mental disorder: Secondary | ICD-10-CM

## 2021-07-12 MED ORDER — ESCITALOPRAM OXALATE 20 MG PO TABS: 20.0000 mg | ORAL_TABLET | Freq: Every day | ORAL | 1 refills | Status: DC

## 2021-07-12 MED ORDER — BUPROPION HCL ER (XL) 150 MG PO TB24: 150.0000 mg | ORAL_TABLET | Freq: Every day | ORAL | 1 refills | Status: DC

## 2021-07-12 NOTE — Patient Instructions (Signed)
Samuel Richmond,  Thank you for talking with me today. I have included our care plan/goals in the following pages.   Please review and call me at [phone] with any questions.  Thanks! Ellin Mayhew, PharmD, CPP Clinical Pharmacist Practitioner  [facility]  [phone]  Patient Care Plan: CCM Pharmacy Care Plan     Problem Identified: CAD, HLD, COPD, Anxiety, and Depression   Priority: High     Long-Range Goal: Disease Management   Start Date: 07/12/2021  Expected End Date: 07/12/2022  This Visit's Progress: On track  Priority: High  Note:   Current Barriers:  Will work to optimize lifestyle choices with healthy diet and increased exercise.  Pharmacist Clinical Goal(s):  Patient will contact provider office for questions/concerns as evidenced notation of same in electronic health record through collaboration with PharmD and provider.   Interventions: 1:1 collaboration with Midge Minium, MD regarding development and update of comprehensive plan of care as evidenced by provider attestation and co-signature Inter-disciplinary care team collaboration (see longitudinal plan of care) Comprehensive medication review performed; medication list updated in electronic medical record  Hyperlipidemia: (LDL goal < 100) -Controlled -Some recent elevations in TGs - has implemented dietary/exercise changes, no longer using steroids -Current treatment: Simvastatin 20 mg once daily -Medications previously tried: n/a  -Current dietary patterns: lower intake in carbs and fatty foods. -Current exercise habits: Routinely walking, expresses interest in using silver sneakers -Reviewed med hx, denies any missed dosing and might have had previous fills on hand -Educated on Cholesterol goals;  Importance of limiting foods high in cholesterol; -Recommended to continue current medication  Mild COPD and Asthma (Goal: control symptoms and prevent  exacerbations) -Controlled -Current treatment  Ventolin rescue inhaler as needed Trelegy as needed -Medications previously tried: Symbicort, Advair  -Gold Grade: Gold 1 (FEV1>80%) -Current COPD Classification:  A (low sx, <2 exacerbations/yr) -MMRC/CAT score: 0/3 -Exacerbations requiring treatment in last 6 months: 0 -Patient denies consistent use of maintenance inhaler -Frequency of rescue inhaler use: rare -Counseled on Proper inhaler technique; Benefits of consistent maintenance inhaler use -Recommended to continue current medication  Depression/Anxiety (Goal: minimize symptoms maintain phq9/GAD7 9/4 or less over next 6 months) -Controlled -Previous unsuccessful trial tapering off of bupropion XL 150 mg ->75 mg IR BID -> 75 mg daily and  escitalopram 20 mg -> 10 mg -> 5 mg - 2.5 mg. Symptoms worsened and patient started back on original dosing outlined below. Feels depression and anxiety are in a good place now and denies any symptoms -Current treatment: Bupropion XL 150 mg once daily Escitalopram 20 mg once daily Alprazolam 0.5 mg twice daily as needed -Medications previously tried/failed: n/a -PHQ9: 0 -GAD7: 0 -Educated on Benefits of medication for symptom control -Recommended to continue current medication Orders adjusted back to previous dosing - confirmed that patient had been maintained on these doses past several weeks.   Patient Goals/Self-Care Activities Patient will:  - take medications as prescribed target a minimum of 150 minutes of moderate intensity exercise weekly engage in dietary modifications by reducing carb rich and fatty foods    The patient was given the following information about Chronic Care Management services today, agreed to services, and gave verbal consent: 1. CCM service includes personalized support from designated clinical staff supervised by the primary care provider, including individualized plan of care and coordination with other care  providers 2. 24/7 contact phone numbers for assistance for urgent and routine care needs. 3. Service will only  be billed when office clinical staff spend 20 minutes or more in a month to coordinate care. 4. Only one practitioner may furnish and bill the service in a calendar month. 5.The patient may stop CCM services at any time (effective at the end of the month) by phone call to the office staff. 6. The patient will be responsible for cost sharing (co-pay) of up to 20% of the service fee (after annual deductible is met). Patient agreed to services and consent obtained.  The patient verbalized understanding of instructions provided today and agreed to receive a MyChart copy of patient instruction and/or educational materials. Telephone follow up appointment with pharmacy team member scheduled for: See next appointment with "Care Management Staff" under "What's Next" below. High Cholesterol  High cholesterol is a condition in which the blood has high levels of a white, waxy substance similar to fat (cholesterol). The liver makes all the cholesterol that the body needs. The human body needs small amounts of cholesterol to help build cells. A person gets extra orexcess cholesterol from the food that he or she eats. The blood carries cholesterol from the liver to the rest of the body. If you have high cholesterol, deposits (plaques) may build up on the walls of your arteries. Arteries are the blood vessels that carry blood away from your heart. These plaques make the arteries narrowand stiff. Cholesterol plaques increase your risk for heart attack and stroke. Work withyour health care provider to keep your cholesterol levels in a healthy range. What increases the risk? The following factors may make you more likely to develop this condition: Eating foods that are high in animal fat (saturated fat) or cholesterol. Being overweight. Not getting enough exercise. A family history of high cholesterol (familial  hypercholesterolemia). Use of tobacco products. Having diabetes. What are the signs or symptoms? There are no symptoms of this condition. How is this diagnosed? This condition may be diagnosed based on the results of a blood test. If you are older than 67 years of age, your health care provider may check your cholesterol levels every 4-6 years. You may be checked more often if you have high cholesterol or other risk factors for heart disease. The blood test for cholesterol measures: "Bad" cholesterol, or LDL cholesterol. This is the main type of cholesterol that causes heart disease. The desired level is less than 100 mg/dL. "Good" cholesterol, or HDL cholesterol. HDL helps protect against heart disease by cleaning the arteries and carrying the LDL to the liver for processing. The desired level for HDL is 60 mg/dL or higher. Triglycerides. These are fats that your body can store or burn for energy. The desired level is less than 150 mg/dL. Total cholesterol. This measures the total amount of cholesterol in your blood and includes LDL, HDL, and triglycerides. The desired level is less than 200 mg/dL. How is this treated? This condition may be treated with: Diet changes. You may be asked to eat foods that have more fiber and less saturated fats or added sugar. Lifestyle changes. These may include regular exercise, maintaining a healthy weight, and quitting use of tobacco products. Medicines. These are given when diet and lifestyle changes have not worked. You may be prescribed a statin medicine to help lower your cholesterol levels. Follow these instructions at home: Eating and drinking  Eat a healthy, balanced diet. This diet includes: Daily servings of a variety of fresh, frozen, or canned fruits and vegetables. Daily servings of whole grain foods that are rich in  fiber. Foods that are low in saturated fats and trans fats. These include poultry and fish without skin, lean cuts of meat, and  low-fat dairy products. A variety of fish, especially oily fish that contain omega-3 fatty acids. Aim to eat fish at least 2 times a week. Avoid foods and drinks that have added sugar. Use healthy cooking methods, such as roasting, grilling, broiling, baking, poaching, steaming, and stir-frying. Do not fry your food except for stir-frying.  Lifestyle  Get regular exercise. Aim to exercise for a total of 150 minutes a week. Increase your activity level by doing activities such as gardening, walking, and taking the stairs. Do not use any products that contain nicotine or tobacco, such as cigarettes, e-cigarettes, and chewing tobacco. If you need help quitting, ask your health care provider.  General instructions Take over-the-counter and prescription medicines only as told by your health care provider. Keep all follow-up visits as told by your health care provider. This is important. Where to find more information American Heart Association: www.heart.org National Heart, Lung, and Blood Institute: https://wilson-eaton.com/ Contact a health care provider if: You have trouble achieving or maintaining a healthy diet or weight. You are starting an exercise program. You are unable to stop smoking. Get help right away if: You have chest pain. You have trouble breathing. You have any symptoms of a stroke. "BE FAST" is an easy way to remember the main warning signs of a stroke: B - Balance. Signs are dizziness, sudden trouble walking, or loss of balance. E - Eyes. Signs are trouble seeing or a sudden change in vision. F - Face. Signs are sudden weakness or numbness of the face, or the face or eyelid drooping on one side. A - Arms. Signs are weakness or numbness in an arm. This happens suddenly and usually on one side of the body. S - Speech. Signs are sudden trouble speaking, slurred speech, or trouble understanding what people say. T - Time. Time to call emergency services. Write down what time symptoms  started. You have other signs of a stroke, such as: A sudden, severe headache with no known cause. Nausea or vomiting. Seizure. These symptoms may represent a serious problem that is an emergency. Do not wait to see if the symptoms will go away. Get medical help right away. Call your local emergency services (911 in the [place] Do not drive yourself to the hospital. Summary Cholesterol plaques increase your risk for heart attack and stroke. Work with your health care provider to keep your cholesterol levels in a healthy range. Eat a healthy, balanced diet, get regular exercise, and maintain a healthy weight. Do not use any products that contain nicotine or tobacco, such as cigarettes, e-cigarettes, and chewing tobacco. Get help right away if you have any symptoms of a stroke. This information is not intended to replace advice given to you by your health care provider. Make sure you discuss any questions you have with your healthcare provider. Document Revised: 10/19/2019 Document Reviewed: 10/19/2019 Elsevier Patient Education  2022 [employer].

## 2021-07-18 DIAGNOSIS — G4733 Obstructive sleep apnea (adult) (pediatric): Secondary | ICD-10-CM | POA: Diagnosis not present

## 2021-07-18 DIAGNOSIS — F419 Anxiety disorder, unspecified: Secondary | ICD-10-CM | POA: Diagnosis not present

## 2021-07-18 DIAGNOSIS — R5383 Other fatigue: Secondary | ICD-10-CM | POA: Diagnosis not present

## 2021-08-03 ENCOUNTER — Telehealth: Payer: Self-pay | Admitting: Oncology

## 2021-08-03 NOTE — Telephone Encounter (Signed)
Called patient regarding upcoming September appointments, patient has been called and notified. 

## 2021-08-15 ENCOUNTER — Telehealth: Payer: Self-pay

## 2021-08-15 NOTE — Telephone Encounter (Signed)
Received fax from [place], patient requesting an increase in her dose of Escitalopram.   LOV 05/18/2021 Future: No upcoming  Please advise

## 2021-08-15 NOTE — Telephone Encounter (Signed)
He is currently on the highest dose.  If we need to make adjustments to medication regimen, he needs to schedule an appt

## 2021-08-15 NOTE — Telephone Encounter (Signed)
Spoke to patient and he stated that he did not need an increase, just wanted the 20 mg. He had attempted to wean himself off and it was not successful so he just wanted to be back on what he always has been. I let him know the 20 mg was sent in, in August with one refill so he should still have one on file with the pharmacy. Instructed him to call them and see, let him know to give me a call back if he had any issues

## 2021-08-18 ENCOUNTER — Inpatient Hospital Stay: Payer: PPO | Attending: Oncology

## 2021-08-18 ENCOUNTER — Other Ambulatory Visit: Payer: Self-pay

## 2021-08-18 DIAGNOSIS — C61 Malignant neoplasm of prostate: Secondary | ICD-10-CM | POA: Insufficient documentation

## 2021-08-18 DIAGNOSIS — F419 Anxiety disorder, unspecified: Secondary | ICD-10-CM | POA: Diagnosis not present

## 2021-08-18 DIAGNOSIS — R5383 Other fatigue: Secondary | ICD-10-CM | POA: Diagnosis not present

## 2021-08-18 DIAGNOSIS — G4733 Obstructive sleep apnea (adult) (pediatric): Secondary | ICD-10-CM | POA: Diagnosis not present

## 2021-08-18 LAB — CMP (CANCER CENTER ONLY)
ALT: 17 U/L (ref 0–44)
AST: 19 U/L (ref 15–41)
Albumin: 4.3 g/dL (ref 3.5–5.0)
Alkaline Phosphatase: 73 U/L (ref 38–126)
Anion gap: 8 (ref 5–15)
BUN: 11 mg/dL (ref 8–23)
CO2: 26 mmol/L (ref 22–32)
Calcium: 9.6 mg/dL (ref 8.9–10.3)
Chloride: 105 mmol/L (ref 98–111)
Creatinine: 0.85 mg/dL (ref 0.61–1.24)
GFR, Estimated: >60 mL/min (ref >60)
Glucose, Bld: 92 mg/dL (ref 70–99)
Potassium: 4.1 mmol/L (ref 3.5–5.1)
Sodium: 139 mmol/L (ref 135–145)
Total Bilirubin: 1 mg/dL (ref 0.3–1.2)
Total Protein: 6.9 g/dL (ref 6.5–8.1)

## 2021-08-18 LAB — CBC WITH DIFFERENTIAL (CANCER CENTER ONLY)
Abs Immature Granulocytes: 0.01 10*3/uL (ref 0.00–0.07)
Basophils Absolute: 0 10*3/uL (ref 0.0–0.1)
Basophils Relative: 0 %
Eosinophils Absolute: 0.1 10*3/uL (ref 0.0–0.5)
Eosinophils Relative: 2 %
HCT: 42.1 % (ref 39.0–52.0)
Hemoglobin: 14.5 g/dL (ref 13.0–17.0)
Immature Granulocytes: 0 %
Lymphocytes Relative: 13 %
Lymphs Abs: 0.7 10*3/uL (ref 0.7–4.0)
MCH: 29.8 pg (ref 26.0–34.0)
MCHC: 34.4 g/dL (ref 30.0–36.0)
MCV: 86.6 fL (ref 80.0–100.0)
Monocytes Absolute: 0.5 10*3/uL (ref 0.1–1.0)
Monocytes Relative: 8 %
Neutro Abs: 4.5 10*3/uL (ref 1.7–7.7)
Neutrophils Relative %: 77 %
Platelet Count: 237 10*3/uL (ref 150–400)
RBC: 4.86 MIL/uL (ref 4.22–5.81)
RDW: 12.2 % (ref 11.5–15.5)
WBC Count: 5.8 10*3/uL (ref 4.0–10.5)
nRBC: 0 % (ref 0.0–0.2)

## 2021-08-19 LAB — PROSTATE-SPECIFIC AG, SERUM (LABCORP): Prostate Specific Ag, Serum: <0.1 ng/mL (ref 0.0–4.0)

## 2021-08-24 ENCOUNTER — Encounter: Payer: Self-pay | Admitting: Oncology

## 2021-08-25 ENCOUNTER — Telehealth: Payer: Self-pay | Admitting: Oncology

## 2021-08-25 ENCOUNTER — Ambulatory Visit: Payer: PPO | Admitting: Oncology

## 2021-08-25 NOTE — Telephone Encounter (Signed)
Scheduled per sch msg. Called and left msg  

## 2021-09-05 DIAGNOSIS — G4733 Obstructive sleep apnea (adult) (pediatric): Secondary | ICD-10-CM | POA: Diagnosis not present

## 2021-09-12 ENCOUNTER — Telehealth: Payer: Self-pay

## 2021-09-12 NOTE — Progress Notes (Signed)
    Chronic Care Management Pharmacy Assistant   Name: Samuel Richmond  MRN: [id number] DOB: [birth year]   Reason for Encounter: Disease State - General Adherence Call     Recent office visits:  None noted.   Recent consult visits:  None noted.   Hospital visits:  None in previous 6 months  Medications: Outpatient Encounter Medications as of 09/12/2021  Medication Sig   albuterol (VENTOLIN HFA) 108 (90 Base) MCG/ACT inhaler Inhale 2 puffs into the lungs every 4 (four) hours as needed for wheezing or shortness of breath.   ALPRAZolam (XANAX) 0.5 MG tablet Take 1 tablet (0.5 mg total) by mouth 2 (two) times daily as needed for anxiety.   buPROPion (WELLBUTRIN XL) 150 MG 24 hr tablet Take 1 tablet (150 mg total) by mouth daily.   escitalopram (LEXAPRO) 20 MG tablet Take 1 tablet (20 mg total) by mouth daily.   fluticasone (FLONASE) 50 MCG/ACT nasal spray Place 2 sprays into both nostrils daily.   Fluticasone-Umeclidin-Vilant (TRELEGY ELLIPTA) 100-62.5-25 MCG/INH AEPB Inhale 1 puff into the lungs daily.   meloxicam (MOBIC) 7.5 MG tablet Take 7.5 mg by mouth daily. (Patient not taking: Reported on 07/10/2021)   omeprazole (PRILOSEC) 40 MG capsule Take 40 mg by mouth daily.    psyllium (METAMUCIL) 58.6 % powder Take 1 packet by mouth 2 (two) times daily.   simvastatin (ZOCOR) 20 MG tablet TAKE 1 TABLET BY MOUTH EVERYDAY AT BEDTIME   No facility-administered encounter medications on file as of 09/12/2021.    Have you had any problems recently with your health? Patient denied any recent problems with his health.  Have you had any problems with your pharmacy? Patient denied any problems with his current pharmacy.  What issues or side effects are you having with your medications? Patient denied any side effects or current issues with his current medications.  What would you like me to pass along to Madelin Rear, CPP for them to help you with?  Patient did not have anything to pass  along to CPP at this time. He reports he is doing well.   What can we do to take care of you better? Patient did not have any suggestions at this time.  Care Gaps  AWV: done 05/18/21 Colonoscopy: done 06/15/16 DM Eye Exam: N/A DM Foot Exam: N/A Microalbumin: N/A HbgAIC: N/A DEXA: N/A  Mammogram:N/A   Star Rating Drugs: simvastatin (ZOCOR) 20 MG tablet - last filled 12/28/20 90 days  (pr denied any lapse in fills but was not able to provide fill date at time of call)  Future Appointments  Date Time Provider [place]  11/17/2021  3:15 PM [facility] None    Jobe Gibbon, CCMA Clinical Pharmacist Assistant  [phone]  Time Spent: 37 minutes

## 2021-09-17 DIAGNOSIS — F419 Anxiety disorder, unspecified: Secondary | ICD-10-CM | POA: Diagnosis not present

## 2021-09-17 DIAGNOSIS — R5383 Other fatigue: Secondary | ICD-10-CM | POA: Diagnosis not present

## 2021-09-17 DIAGNOSIS — G4733 Obstructive sleep apnea (adult) (pediatric): Secondary | ICD-10-CM | POA: Diagnosis not present

## 2021-09-21 DIAGNOSIS — G4733 Obstructive sleep apnea (adult) (pediatric): Secondary | ICD-10-CM | POA: Diagnosis not present

## 2021-10-08 ENCOUNTER — Other Ambulatory Visit: Payer: Self-pay | Admitting: Pulmonary Disease

## 2021-10-10 NOTE — Telephone Encounter (Signed)
Dr. Jenetta Downer, please advise if you are okay with refilling med.

## 2021-10-18 DIAGNOSIS — R5383 Other fatigue: Secondary | ICD-10-CM | POA: Diagnosis not present

## 2021-10-18 DIAGNOSIS — G4733 Obstructive sleep apnea (adult) (pediatric): Secondary | ICD-10-CM | POA: Diagnosis not present

## 2021-10-18 DIAGNOSIS — F419 Anxiety disorder, unspecified: Secondary | ICD-10-CM | POA: Diagnosis not present

## 2021-11-03 ENCOUNTER — Other Ambulatory Visit: Payer: Self-pay | Admitting: Family Medicine

## 2021-11-09 ENCOUNTER — Encounter: Payer: Self-pay | Admitting: Family Medicine

## 2021-11-09 ENCOUNTER — Other Ambulatory Visit: Payer: Self-pay

## 2021-11-09 MED ORDER — ESCITALOPRAM OXALATE 20 MG PO TABS: 20.0000 mg | ORAL_TABLET | Freq: Every day | ORAL | 1 refills | Status: AC

## 2021-11-17 ENCOUNTER — Inpatient Hospital Stay: Payer: PPO | Attending: Oncology

## 2021-11-17 ENCOUNTER — Other Ambulatory Visit: Payer: Self-pay

## 2021-11-17 DIAGNOSIS — F419 Anxiety disorder, unspecified: Secondary | ICD-10-CM | POA: Diagnosis not present

## 2021-11-17 DIAGNOSIS — G4733 Obstructive sleep apnea (adult) (pediatric): Secondary | ICD-10-CM | POA: Diagnosis not present

## 2021-11-17 DIAGNOSIS — C61 Malignant neoplasm of prostate: Secondary | ICD-10-CM | POA: Diagnosis not present

## 2021-11-17 DIAGNOSIS — R5383 Other fatigue: Secondary | ICD-10-CM | POA: Diagnosis not present

## 2021-11-17 LAB — CBC WITH DIFFERENTIAL (CANCER CENTER ONLY)
Abs Immature Granulocytes: 0.02 10*3/uL (ref 0.00–0.07)
Basophils Absolute: 0 10*3/uL (ref 0.0–0.1)
Basophils Relative: 0 %
Eosinophils Absolute: 0.1 10*3/uL (ref 0.0–0.5)
Eosinophils Relative: 2 %
HCT: 40 % (ref 39.0–52.0)
Hemoglobin: 13.8 g/dL (ref 13.0–17.0)
Immature Granulocytes: 0 %
Lymphocytes Relative: 14 %
Lymphs Abs: 0.8 10*3/uL (ref 0.7–4.0)
MCH: 29.8 pg (ref 26.0–34.0)
MCHC: 34.5 g/dL (ref 30.0–36.0)
MCV: 86.4 fL (ref 80.0–100.0)
Monocytes Absolute: 0.5 10*3/uL (ref 0.1–1.0)
Monocytes Relative: 8 %
Neutro Abs: 4.1 10*3/uL (ref 1.7–7.7)
Neutrophils Relative %: 76 %
Platelet Count: 213 10*3/uL (ref 150–400)
RBC: 4.63 MIL/uL (ref 4.22–5.81)
RDW: 12.7 % (ref 11.5–15.5)
WBC Count: 5.4 10*3/uL (ref 4.0–10.5)
nRBC: 0 % (ref 0.0–0.2)

## 2021-11-17 LAB — CMP (CANCER CENTER ONLY)
ALT: 18 U/L (ref 0–44)
AST: 17 U/L (ref 15–41)
Albumin: 4.1 g/dL (ref 3.5–5.0)
Alkaline Phosphatase: 88 U/L (ref 38–126)
Anion gap: 8 (ref 5–15)
BUN: 11 mg/dL (ref 8–23)
CO2: 26 mmol/L (ref 22–32)
Calcium: 9 mg/dL (ref 8.9–10.3)
Chloride: 108 mmol/L (ref 98–111)
Creatinine: 0.85 mg/dL (ref 0.61–1.24)
GFR, Estimated: >60 mL/min (ref >60)
Glucose, Bld: 106 mg/dL — ABNORMAL HIGH (ref 70–99)
Potassium: 3.8 mmol/L (ref 3.5–5.1)
Sodium: 142 mmol/L (ref 135–145)
Total Bilirubin: 1.2 mg/dL (ref 0.3–1.2)
Total Protein: 6.6 g/dL (ref 6.5–8.1)

## 2021-11-18 LAB — PROSTATE-SPECIFIC AG, SERUM (LABCORP): Prostate Specific Ag, Serum: <0.1 ng/mL (ref 0.0–4.0)

## 2021-12-27 ENCOUNTER — Encounter: Payer: Self-pay | Admitting: Family Medicine

## 2022-01-23 ENCOUNTER — Telehealth: Payer: Self-pay | Admitting: Pharmacist

## 2022-01-23 NOTE — Progress Notes (Signed)
° ° °  Chronic Care Management Pharmacy Assistant   Name: RONY RATZ  MRN: [id number] DOB: Mar 01, 1956   Reason for Encounter: Relocation    Per Pulmonology note from 12/18/21 Patient retired and relocated to [employer] of 2023. Will update patient panel to reflect this change and update CPP.   Jobe Gibbon, [facility] Clinical Pharmacist Assistant  [phone]

## 2022-01-29 ENCOUNTER — Other Ambulatory Visit: Payer: Self-pay | Admitting: Family Medicine

## 2022-02-12 ENCOUNTER — Other Ambulatory Visit: Payer: Self-pay | Admitting: Pulmonary Disease

## 2022-02-23 ENCOUNTER — Other Ambulatory Visit: Payer: Self-pay | Admitting: Family Medicine

## 2022-02-23 DIAGNOSIS — E782 Mixed hyperlipidemia: Secondary | ICD-10-CM

## 2022-03-22 ENCOUNTER — Encounter: Payer: Self-pay | Admitting: Oncology

## 2022-03-22 ENCOUNTER — Encounter: Payer: Self-pay | Admitting: Family Medicine

## 2022-05-07 LAB — COLOGUARD: COLOGUARD: POSITIVE — AB

## 2023-01-04 ENCOUNTER — Other Ambulatory Visit: Payer: Self-pay | Admitting: Family Medicine

## 2023-02-18 ENCOUNTER — Other Ambulatory Visit: Payer: Self-pay | Admitting: Family Medicine

## 2023-02-18 DIAGNOSIS — E782 Mixed hyperlipidemia: Secondary | ICD-10-CM

## 2023-02-26 ENCOUNTER — Other Ambulatory Visit: Payer: Self-pay | Admitting: Family Medicine

## 2023-02-26 DIAGNOSIS — E782 Mixed hyperlipidemia: Secondary | ICD-10-CM
